# Patient Record
Sex: Female | Born: 1960 | Race: White | Hispanic: No | Marital: Married | Smoking: Never smoker
Health system: Southern US, Community
[De-identification: ages and names within clinical notes are randomized; demographics above are authoritative.]

## PROBLEM LIST (undated history)

## (undated) DIAGNOSIS — K219 Gastro-esophageal reflux disease without esophagitis: Secondary | ICD-10-CM

## (undated) DIAGNOSIS — R112 Nausea with vomiting, unspecified: Secondary | ICD-10-CM

## (undated) DIAGNOSIS — I471 Supraventricular tachycardia, unspecified: Secondary | ICD-10-CM

## (undated) DIAGNOSIS — M549 Dorsalgia, unspecified: Secondary | ICD-10-CM

## (undated) DIAGNOSIS — M1990 Unspecified osteoarthritis, unspecified site: Secondary | ICD-10-CM

## (undated) DIAGNOSIS — F419 Anxiety disorder, unspecified: Secondary | ICD-10-CM

## (undated) DIAGNOSIS — J45909 Unspecified asthma, uncomplicated: Secondary | ICD-10-CM

## (undated) DIAGNOSIS — F329 Major depressive disorder, single episode, unspecified: Secondary | ICD-10-CM

## (undated) DIAGNOSIS — I10 Essential (primary) hypertension: Secondary | ICD-10-CM

## (undated) DIAGNOSIS — F32A Depression, unspecified: Secondary | ICD-10-CM

## (undated) DIAGNOSIS — Z973 Presence of spectacles and contact lenses: Secondary | ICD-10-CM

## (undated) DIAGNOSIS — Z9889 Other specified postprocedural states: Secondary | ICD-10-CM

## (undated) HISTORY — DX: Unspecified asthma, uncomplicated: J45.909

## (undated) HISTORY — DX: Gastro-esophageal reflux disease without esophagitis: K21.9

## (undated) HISTORY — DX: Unspecified osteoarthritis, unspecified site: M19.90

## (undated) HISTORY — DX: Anxiety disorder, unspecified: F41.9

## (undated) HISTORY — PX: PATELLECTOMY: SHX1022

## (undated) HISTORY — DX: Dorsalgia, unspecified: M54.9

## (undated) HISTORY — PX: APPENDECTOMY: SHX54

## (undated) HISTORY — DX: Depression, unspecified: F32.A

## (undated) HISTORY — PX: TOTAL KNEE ARTHROPLASTY: SHX125

## (undated) HISTORY — DX: Supraventricular tachycardia: I47.1

## (undated) HISTORY — DX: Supraventricular tachycardia, unspecified: I47.10

## (undated) HISTORY — DX: Major depressive disorder, single episode, unspecified: F32.9

## (undated) HISTORY — DX: Essential (primary) hypertension: I10

---

## 1998-02-28 ENCOUNTER — Ambulatory Visit (HOSPITAL_COMMUNITY): Admission: RE | Admit: 1998-02-28 | Discharge: 1998-02-28 | Payer: Self-pay

## 1998-08-05 HISTORY — PX: FOOT SURGERY: SHX648

## 1999-08-06 HISTORY — PX: OTHER SURGICAL HISTORY: SHX169

## 2000-01-30 ENCOUNTER — Encounter: Admission: RE | Admit: 2000-01-30 | Discharge: 2000-01-30 | Payer: Self-pay

## 2000-04-15 ENCOUNTER — Other Ambulatory Visit: Admission: RE | Admit: 2000-04-15 | Discharge: 2000-04-15 | Payer: Self-pay | Admitting: *Deleted

## 2000-06-12 ENCOUNTER — Encounter: Payer: Self-pay | Admitting: Rheumatology

## 2000-06-12 ENCOUNTER — Encounter: Admission: RE | Admit: 2000-06-12 | Discharge: 2000-06-12 | Payer: Self-pay | Admitting: Rheumatology

## 2001-04-29 ENCOUNTER — Emergency Department (HOSPITAL_COMMUNITY): Admission: EM | Admit: 2001-04-29 | Discharge: 2001-04-29 | Payer: Self-pay | Admitting: Emergency Medicine

## 2001-04-29 ENCOUNTER — Encounter: Payer: Self-pay | Admitting: Emergency Medicine

## 2001-08-05 HISTORY — PX: HARDWARE REMOVAL: SHX979

## 2001-09-16 ENCOUNTER — Ambulatory Visit (HOSPITAL_BASED_OUTPATIENT_CLINIC_OR_DEPARTMENT_OTHER): Admission: RE | Admit: 2001-09-16 | Discharge: 2001-09-17 | Payer: Self-pay | Admitting: Orthopedic Surgery

## 2001-12-17 ENCOUNTER — Encounter (INDEPENDENT_AMBULATORY_CARE_PROVIDER_SITE_OTHER): Payer: Self-pay | Admitting: Specialist

## 2001-12-17 ENCOUNTER — Ambulatory Visit (HOSPITAL_COMMUNITY): Admission: RE | Admit: 2001-12-17 | Discharge: 2001-12-17 | Payer: Self-pay | Admitting: Gastroenterology

## 2002-01-11 ENCOUNTER — Ambulatory Visit (HOSPITAL_BASED_OUTPATIENT_CLINIC_OR_DEPARTMENT_OTHER): Admission: RE | Admit: 2002-01-11 | Discharge: 2002-01-11 | Payer: Self-pay | Admitting: Orthopedic Surgery

## 2002-09-10 ENCOUNTER — Other Ambulatory Visit: Admission: RE | Admit: 2002-09-10 | Discharge: 2002-09-10 | Payer: Self-pay | Admitting: Obstetrics and Gynecology

## 2002-10-08 ENCOUNTER — Encounter: Payer: Self-pay | Admitting: Emergency Medicine

## 2002-10-08 ENCOUNTER — Emergency Department (HOSPITAL_COMMUNITY): Admission: EM | Admit: 2002-10-08 | Discharge: 2002-10-08 | Payer: Self-pay | Admitting: Emergency Medicine

## 2003-01-27 ENCOUNTER — Encounter: Admission: RE | Admit: 2003-01-27 | Discharge: 2003-01-27 | Payer: Self-pay | Admitting: Rheumatology

## 2003-01-27 ENCOUNTER — Encounter: Payer: Self-pay | Admitting: Rheumatology

## 2003-10-14 ENCOUNTER — Encounter: Admission: RE | Admit: 2003-10-14 | Discharge: 2003-10-14 | Payer: Self-pay | Admitting: Gastroenterology

## 2003-11-14 ENCOUNTER — Ambulatory Visit (HOSPITAL_COMMUNITY): Admission: RE | Admit: 2003-11-14 | Discharge: 2003-11-14 | Payer: Self-pay | Admitting: Gastroenterology

## 2003-12-13 ENCOUNTER — Other Ambulatory Visit: Admission: RE | Admit: 2003-12-13 | Discharge: 2003-12-13 | Payer: Self-pay | Admitting: Obstetrics and Gynecology

## 2004-02-24 ENCOUNTER — Ambulatory Visit (HOSPITAL_COMMUNITY): Admission: RE | Admit: 2004-02-24 | Discharge: 2004-02-24 | Payer: Self-pay | Admitting: Obstetrics and Gynecology

## 2004-05-21 ENCOUNTER — Encounter: Admission: RE | Admit: 2004-05-21 | Discharge: 2004-05-21 | Payer: Self-pay | Admitting: Family Medicine

## 2004-06-22 ENCOUNTER — Ambulatory Visit (HOSPITAL_BASED_OUTPATIENT_CLINIC_OR_DEPARTMENT_OTHER): Admission: RE | Admit: 2004-06-22 | Discharge: 2004-06-22 | Payer: Self-pay | Admitting: Orthopedic Surgery

## 2005-04-09 ENCOUNTER — Other Ambulatory Visit: Admission: RE | Admit: 2005-04-09 | Discharge: 2005-04-09 | Payer: Self-pay | Admitting: Obstetrics and Gynecology

## 2005-07-15 ENCOUNTER — Ambulatory Visit: Payer: Self-pay | Admitting: Family Medicine

## 2005-07-23 ENCOUNTER — Ambulatory Visit: Payer: Self-pay | Admitting: Family Medicine

## 2005-08-06 ENCOUNTER — Ambulatory Visit: Payer: Self-pay | Admitting: Family Medicine

## 2005-08-20 ENCOUNTER — Ambulatory Visit: Payer: Self-pay | Admitting: Family Medicine

## 2005-09-16 ENCOUNTER — Ambulatory Visit: Payer: Self-pay | Admitting: Family Medicine

## 2005-10-15 ENCOUNTER — Ambulatory Visit: Payer: Self-pay | Admitting: Family Medicine

## 2005-10-18 ENCOUNTER — Encounter: Admission: RE | Admit: 2005-10-18 | Discharge: 2005-10-18 | Payer: Self-pay | Admitting: Family Medicine

## 2005-10-29 ENCOUNTER — Ambulatory Visit (HOSPITAL_COMMUNITY): Admission: RE | Admit: 2005-10-29 | Discharge: 2005-10-29 | Payer: Self-pay | Admitting: Gastroenterology

## 2006-02-17 ENCOUNTER — Ambulatory Visit: Payer: Self-pay | Admitting: Family Medicine

## 2006-05-13 DIAGNOSIS — K219 Gastro-esophageal reflux disease without esophagitis: Secondary | ICD-10-CM | POA: Insufficient documentation

## 2006-05-13 DIAGNOSIS — J309 Allergic rhinitis, unspecified: Secondary | ICD-10-CM | POA: Insufficient documentation

## 2006-05-13 DIAGNOSIS — M899 Disorder of bone, unspecified: Secondary | ICD-10-CM | POA: Insufficient documentation

## 2006-05-13 DIAGNOSIS — M949 Disorder of cartilage, unspecified: Secondary | ICD-10-CM

## 2006-05-16 ENCOUNTER — Ambulatory Visit: Payer: Self-pay | Admitting: Family Medicine

## 2006-05-30 ENCOUNTER — Ambulatory Visit: Payer: Self-pay | Admitting: Family Medicine

## 2006-06-01 DIAGNOSIS — E6609 Other obesity due to excess calories: Secondary | ICD-10-CM | POA: Insufficient documentation

## 2006-06-01 DIAGNOSIS — E669 Obesity, unspecified: Secondary | ICD-10-CM

## 2006-06-03 ENCOUNTER — Encounter: Admission: RE | Admit: 2006-06-03 | Discharge: 2006-06-03 | Payer: Self-pay | Admitting: Rheumatology

## 2006-08-07 ENCOUNTER — Encounter: Payer: Self-pay | Admitting: Family Medicine

## 2006-08-07 ENCOUNTER — Other Ambulatory Visit: Admission: RE | Admit: 2006-08-07 | Discharge: 2006-08-07 | Payer: Self-pay | Admitting: Obstetrics & Gynecology

## 2006-08-07 LAB — CONVERTED CEMR LAB
Cholesterol: 262 mg/dL
HCT: 44.5 %
HDL: 70 mg/dL
Hemoglobin: 14.6 g/dL
LDL Cholesterol: 170 mg/dL
MCV: 96.9 fL
Platelets: 296 10*3/uL
TSH: 2.911 microintl units/mL
Triglycerides: 112 mg/dL
WBC, blood: 6 10*3/uL

## 2006-08-12 ENCOUNTER — Ambulatory Visit: Payer: Self-pay | Admitting: Family Medicine

## 2006-08-12 DIAGNOSIS — J45909 Unspecified asthma, uncomplicated: Secondary | ICD-10-CM | POA: Insufficient documentation

## 2006-08-22 ENCOUNTER — Encounter: Payer: Self-pay | Admitting: Family Medicine

## 2006-08-22 DIAGNOSIS — E785 Hyperlipidemia, unspecified: Secondary | ICD-10-CM | POA: Insufficient documentation

## 2006-08-22 DIAGNOSIS — E782 Mixed hyperlipidemia: Secondary | ICD-10-CM | POA: Insufficient documentation

## 2006-09-15 ENCOUNTER — Encounter (INDEPENDENT_AMBULATORY_CARE_PROVIDER_SITE_OTHER): Payer: Self-pay | Admitting: *Deleted

## 2006-09-15 ENCOUNTER — Ambulatory Visit (HOSPITAL_BASED_OUTPATIENT_CLINIC_OR_DEPARTMENT_OTHER): Admission: RE | Admit: 2006-09-15 | Discharge: 2006-09-15 | Payer: Self-pay | Admitting: Obstetrics & Gynecology

## 2006-09-19 ENCOUNTER — Ambulatory Visit: Payer: Self-pay | Admitting: Family Medicine

## 2006-10-14 ENCOUNTER — Encounter: Payer: Self-pay | Admitting: Family Medicine

## 2006-10-28 ENCOUNTER — Encounter: Payer: Self-pay | Admitting: Obstetrics & Gynecology

## 2006-10-28 ENCOUNTER — Inpatient Hospital Stay (HOSPITAL_COMMUNITY): Admission: RE | Admit: 2006-10-28 | Discharge: 2006-10-30 | Payer: Self-pay | Admitting: Obstetrics & Gynecology

## 2007-01-06 ENCOUNTER — Ambulatory Visit: Payer: Self-pay | Admitting: Family Medicine

## 2007-01-06 DIAGNOSIS — M459 Ankylosing spondylitis of unspecified sites in spine: Secondary | ICD-10-CM | POA: Insufficient documentation

## 2007-01-07 ENCOUNTER — Encounter: Payer: Self-pay | Admitting: Family Medicine

## 2007-02-13 ENCOUNTER — Encounter: Payer: Self-pay | Admitting: Family Medicine

## 2007-02-16 ENCOUNTER — Telehealth: Payer: Self-pay | Admitting: Family Medicine

## 2007-04-20 ENCOUNTER — Encounter: Admission: RE | Admit: 2007-04-20 | Discharge: 2007-04-20 | Payer: Self-pay | Admitting: Obstetrics & Gynecology

## 2007-07-14 ENCOUNTER — Ambulatory Visit: Payer: Self-pay | Admitting: Family Medicine

## 2007-07-14 DIAGNOSIS — J069 Acute upper respiratory infection, unspecified: Secondary | ICD-10-CM | POA: Insufficient documentation

## 2007-07-20 ENCOUNTER — Telehealth: Payer: Self-pay | Admitting: Family Medicine

## 2007-07-21 ENCOUNTER — Telehealth: Payer: Self-pay | Admitting: Family Medicine

## 2007-08-03 ENCOUNTER — Ambulatory Visit: Payer: Self-pay | Admitting: Family Medicine

## 2007-08-03 DIAGNOSIS — R002 Palpitations: Secondary | ICD-10-CM | POA: Insufficient documentation

## 2007-08-04 ENCOUNTER — Encounter: Payer: Self-pay | Admitting: Family Medicine

## 2007-08-04 LAB — CONVERTED CEMR LAB
ALT: 21 units/L (ref 0–35)
AST: 18 units/L (ref 0–37)
Albumin: 4.3 g/dL (ref 3.5–5.2)
Alkaline Phosphatase: 47 units/L (ref 39–117)
BUN: 10 mg/dL (ref 6–23)
Basophils Absolute: 0 10*3/uL (ref 0.0–0.1)
Basophils Relative: 0 % (ref 0–1)
CO2: 21 meq/L (ref 19–32)
Calcium: 9.2 mg/dL (ref 8.4–10.5)
Chloride: 106 meq/L (ref 96–112)
Cholesterol: 238 mg/dL — ABNORMAL HIGH (ref 0–200)
Creatinine, Ser: 0.69 mg/dL (ref 0.40–1.20)
Eosinophils Absolute: 0.1 10*3/uL (ref 0.0–0.7)
Eosinophils Relative: 1 % (ref 0–5)
Glucose, Bld: 90 mg/dL (ref 70–99)
HCT: 42.9 % (ref 36.0–46.0)
HDL: 57 mg/dL (ref >39)
Hemoglobin: 14.4 g/dL (ref 12.0–15.0)
LDL Cholesterol: 166 mg/dL — ABNORMAL HIGH (ref 0–99)
Lymphocytes Relative: 43 % (ref 12–46)
Lymphs Abs: 2.8 10*3/uL (ref 0.7–4.0)
MCHC: 33.6 g/dL (ref 30.0–36.0)
MCV: 94.5 fL (ref 78.0–100.0)
Monocytes Absolute: 0.5 10*3/uL (ref 0.1–1.0)
Monocytes Relative: 7 % (ref 3–12)
Neutro Abs: 3.1 10*3/uL (ref 1.7–7.7)
Neutrophils Relative %: 48 % (ref 43–77)
Platelets: 270 10*3/uL (ref 150–400)
Potassium: 4.3 meq/L (ref 3.5–5.3)
RBC: 4.54 M/uL (ref 3.87–5.11)
RDW: 12.7 % (ref 11.5–15.5)
Sodium: 140 meq/L (ref 135–145)
Total Bilirubin: 0.5 mg/dL (ref 0.3–1.2)
Total CHOL/HDL Ratio: 4.2
Total Protein: 7.3 g/dL (ref 6.0–8.3)
Triglycerides: 77 mg/dL (ref <150)
VLDL: 15 mg/dL (ref 0–40)
WBC: 6.5 10*3/uL (ref 4.0–10.5)

## 2007-08-05 ENCOUNTER — Encounter: Payer: Self-pay | Admitting: Family Medicine

## 2007-10-04 HISTORY — PX: ABDOMINAL HYSTERECTOMY: SHX81

## 2007-10-04 HISTORY — PX: TOTAL ABDOMINAL HYSTERECTOMY: SHX209

## 2007-10-15 ENCOUNTER — Encounter: Payer: Self-pay | Admitting: Family Medicine

## 2007-11-10 ENCOUNTER — Encounter: Payer: Self-pay | Admitting: Family Medicine

## 2008-04-14 ENCOUNTER — Encounter: Payer: Self-pay | Admitting: Family Medicine

## 2008-05-16 ENCOUNTER — Encounter: Admission: RE | Admit: 2008-05-16 | Discharge: 2008-05-16 | Payer: Self-pay | Admitting: Obstetrics & Gynecology

## 2008-07-13 ENCOUNTER — Encounter: Payer: Self-pay | Admitting: Family Medicine

## 2008-08-30 ENCOUNTER — Encounter: Payer: Self-pay | Admitting: Family Medicine

## 2009-03-09 ENCOUNTER — Encounter: Payer: Self-pay | Admitting: Family Medicine

## 2009-05-22 ENCOUNTER — Encounter: Admission: RE | Admit: 2009-05-22 | Discharge: 2009-05-22 | Payer: Self-pay | Admitting: Obstetrics & Gynecology

## 2009-07-09 ENCOUNTER — Emergency Department (HOSPITAL_BASED_OUTPATIENT_CLINIC_OR_DEPARTMENT_OTHER): Admission: EM | Admit: 2009-07-09 | Discharge: 2009-07-09 | Payer: Self-pay | Admitting: Emergency Medicine

## 2009-07-09 ENCOUNTER — Ambulatory Visit: Payer: Self-pay | Admitting: Diagnostic Radiology

## 2009-07-20 ENCOUNTER — Encounter: Admission: RE | Admit: 2009-07-20 | Discharge: 2009-07-20 | Payer: Self-pay | Admitting: Rheumatology

## 2009-07-20 ENCOUNTER — Encounter: Payer: Self-pay | Admitting: Family Medicine

## 2009-08-18 ENCOUNTER — Emergency Department (HOSPITAL_COMMUNITY): Admission: EM | Admit: 2009-08-18 | Discharge: 2009-08-18 | Payer: Self-pay | Admitting: Emergency Medicine

## 2009-10-12 ENCOUNTER — Ambulatory Visit (HOSPITAL_COMMUNITY): Admission: RE | Admit: 2009-10-12 | Discharge: 2009-10-12 | Payer: Self-pay | Admitting: General Surgery

## 2010-05-15 ENCOUNTER — Ambulatory Visit: Payer: Self-pay | Admitting: Emergency Medicine

## 2010-05-15 DIAGNOSIS — R059 Cough, unspecified: Secondary | ICD-10-CM | POA: Insufficient documentation

## 2010-05-15 DIAGNOSIS — R05 Cough: Secondary | ICD-10-CM

## 2010-05-15 DIAGNOSIS — R079 Chest pain, unspecified: Secondary | ICD-10-CM | POA: Insufficient documentation

## 2010-05-21 ENCOUNTER — Telehealth (INDEPENDENT_AMBULATORY_CARE_PROVIDER_SITE_OTHER): Payer: Self-pay | Admitting: *Deleted

## 2010-06-03 ENCOUNTER — Encounter: Payer: Self-pay | Admitting: Emergency Medicine

## 2010-06-03 ENCOUNTER — Emergency Department (HOSPITAL_COMMUNITY): Admission: EM | Admit: 2010-06-03 | Discharge: 2010-06-03 | Payer: Self-pay | Admitting: Emergency Medicine

## 2010-06-03 ENCOUNTER — Ambulatory Visit: Payer: Self-pay | Admitting: Diagnostic Radiology

## 2010-08-26 ENCOUNTER — Encounter: Payer: Self-pay | Admitting: Gastroenterology

## 2010-08-26 ENCOUNTER — Encounter: Payer: Self-pay | Admitting: Family Medicine

## 2010-09-04 NOTE — Progress Notes (Signed)
  Phone Note Outgoing Call   Call placed by: Lajean Saver RN,  May 21, 2010 2:26 PM Call placed to: Patient Summary of Call: Call back: No answer. Message left with reason for call and call back with any questions.

## 2010-09-04 NOTE — Assessment & Plan Note (Signed)
Summary: SIDE AND BACK PAIN/COUGH   Vital Signs:  Patient Profile:   50 Years Old Female CC:      Upper back pain started in right scapula now radiates to front under breast, cough x 1 month Height:     64 inches Weight:      195 pounds O2 Sat:      98 % O2 treatment:    Room Air Temp:     99.0 degrees F oral Pulse rate:   79 / minute Pulse rhythm:   regular Resp:     16 per minute BP sitting:   117 / 75  (left arm) Cuff size:   large  Vitals Entered By: Emilio Math (May 15, 2010 10:47 AM)                  Current Allergies (reviewed today): CODEINE SULFATE (CODEINE SULFATE)History of Present Illness Chief Complaint: Upper back pain started in right scapula now radiates to front under breast, cough x 1 month History of Present Illness: Upper back pain start R lower scapula and radiates down around chest for the past few weeks.  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  No rash or tenderness.  Worse with coughing, sneezing, twisting.  Has had some URI symptoms including cough recently, but also has a history of allergic rhinitis and is on Allegra-D daily.  Flexeril helps.  Current Meds MULTIVITAMINS  CAPS (MULTIPLE VITAMIN) 1tab by mouth daily DEXILANT 30 MG CPDR (DEXLANSOPRAZOLE)  FLEXERIL 10 MG TABS (CYCLOBENZAPRINE HCL)  ALLEGRA-D ALLERGY & CONGESTION 60-120 MG XR12H-TAB (FEXOFENADINE-PSEUDOEPHEDRINE)  EFFEXOR XR 37.5 MG XR24H-CAP (VENLAFAXINE HCL)  TESSALON 200 MG CAPS (BENZONATATE) 1 cap by mouth three times a day as needed for cough MELOXICAM 7.5 MG TABS (MELOXICAM) 1 tab by mouth two times a day for 10 days, then as needed  REVIEW OF SYSTEMS Constitutional Symptoms      Denies fever, chills, night sweats, weight loss, weight gain, and fatigue.  Eyes       Denies change in vision, eye pain, eye discharge, glasses, contact lenses, and eye surgery. Ear/Nose/Throat/Mouth       Denies hearing loss/aids, change in  hearing, ear pain, ear discharge, dizziness, frequent runny nose, frequent nose bleeds, sinus problems, sore throat, hoarseness, and tooth pain or bleeding.  Respiratory       Complains of dry cough and shortness of breath.      Denies productive cough, wheezing, asthma, bronchitis, and emphysema/COPD.  Cardiovascular       Complains of chest pain and tires easily with exhertion.      Denies murmurs.    Gastrointestinal       Denies stomach pain, nausea/vomiting, diarrhea, constipation, blood in bowel movements, and indigestion. Genitourniary       Denies painful urination, kidney stones, and loss of urinary control. Neurological       Denies paralysis, seizures, and fainting/blackouts. Musculoskeletal       Complains of muscle pain and decreased range of motion.      Denies joint pain, joint stiffness, redness, swelling, muscle weakness, and gout.  Skin       Denies bruising, unusual mles/lumps or sores, and hair/skin or nail changes.  Psych       Denies mood changes, temper/anger issues, anxiety/stress, speech problems, depression, and sleep problems.  Past History:  Past Medical History: Reviewed history from 01/06/2007 and no changes required. 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 Appendectomy , arm surgery,  foot surgery TAH 2008  Family History: Reviewed history from 06/01/2006 and no changes required. father healthy  mother died of AMI (94), had HTN, DM sister diabetic  Social History: Reviewed history from 05/13/2006 and no changes required. 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  Lesbian relationship.   Nonsmoker. Trouble exercising in winter due to bronchospasm. Physical  Exam General appearance: well developed, well nourished, no acute distress Ears: normal, no lesions or deformities Nasal: mucosa pink, nonedematous, no septal deviation, turbinates normal Oral/Pharynx: tongue normal, posterior pharynx without erythema or exudate Chest/Lungs: no rales, wheezes, or rhonchi bilateral, breath sounds equal without effort. TTP along R 9th/10th ribs Heart: regular rate and  rhythm, no murmur Extremities: R shoulder FROM Neurological: grossly intact and non-focal Skin: no obvious rashes or lesions.  No evidence of shingles. MSE: oriented to time, place, and person Assessment New Problems: COUGH (ICD-786.2) CHEST PAIN, RIGHT (ICD-786.50)  CXR is normal.  Will assume costochondritis vs muscle strain and will treat accordingly.    Plan New Medications/Changes: MELOXICAM 7.5 MG TABS (MELOXICAM) 1 tab by mouth two times a day for 10 days, then as needed  #50 x 0, 05/15/2010, Hoyt Koch MD TESSALON 200 MG CAPS (BENZONATATE) 1 cap by mouth three times a day as needed for cough  #30 x 0, 05/15/2010, Hoyt Koch MD  New Orders: New Patient Level III [id number] T-Chest x-ray, 2 views [71020] Planning Comments:   Take meds as directed Follow-up with your primary care physician if not improving or if getting worse   The patient and/or caregiver has been counseled thoroughly with regard to medications prescribed including dosage, schedule, interactions, rationale for use, and possible side effects and they verbalize understanding.  Diagnoses and expected course of recovery discussed and will return if not improved as expected or if the condition worsens. Patient and/or caregiver verbalized understanding.  Prescriptions: MELOXICAM 7.5 MG TABS (MELOXICAM) 1 tab by mouth two times a day for 10 days, then as needed  #50 x 0   Entered and Authorized by:   Hoyt Koch MD   Signed by:   Hoyt Koch MD on 05/15/2010   Method used:   Print then Give to  Patient   RxID:   [id number] TESSALON 200 MG CAPS (BENZONATATE) 1 cap by mouth three times a day as needed for cough  #30 x 0   Entered and Authorized by:   Hoyt Koch MD   Signed by:   Hoyt Koch MD on 05/15/2010   Method used:   Print then Give to Patient   RxID:   [id number]   Orders Added: 1)  New Patient Level III [29528] 2)  T-Chest x-ray, 2 views [71020]

## 2010-09-04 NOTE — Letter (Signed)
Summary: CONTROLLED MED RX POLICY  CONTROLLED MED RX POLICY   Imported By: Dannette [name] 05/15/2010 16:01:40  _____________________________________________________________________  External Attachment:    Type:   Image     Comment:   External Document

## 2010-10-17 LAB — BASIC METABOLIC PANEL
BUN: 12 mg/dL (ref 6–23)
CO2: 25 mEq/L (ref 19–32)
Calcium: 9.5 mg/dL (ref 8.4–10.5)
Chloride: 107 mEq/L (ref 96–112)
Creatinine, Ser: 0.6 mg/dL (ref 0.4–1.2)
GFR calc Af Amer: >60 mL/min (ref >60)
GFR calc non Af Amer: >60 mL/min (ref >60)
Glucose, Bld: 85 mg/dL (ref 70–99)
Potassium: 4.3 mEq/L (ref 3.5–5.1)
Sodium: 143 mEq/L (ref 135–145)

## 2010-10-17 LAB — CBC
HCT: 43.2 % (ref 36.0–46.0)
Hemoglobin: 14.7 g/dL (ref 12.0–15.0)
MCH: 32.2 pg (ref 26.0–34.0)
MCHC: 34.1 g/dL (ref 30.0–36.0)
MCV: 94.5 fL (ref 78.0–100.0)
Platelets: 260 10*3/uL (ref 150–400)
RBC: 4.57 MIL/uL (ref 3.87–5.11)
RDW: 12.4 % (ref 11.5–15.5)
WBC: 7 10*3/uL (ref 4.0–10.5)

## 2010-10-26 ENCOUNTER — Other Ambulatory Visit: Payer: Self-pay | Admitting: Obstetrics & Gynecology

## 2010-10-26 DIAGNOSIS — Z1231 Encounter for screening mammogram for malignant neoplasm of breast: Secondary | ICD-10-CM

## 2010-10-26 DIAGNOSIS — Z90710 Acquired absence of both cervix and uterus: Secondary | ICD-10-CM

## 2010-10-30 ENCOUNTER — Ambulatory Visit
Admission: RE | Admit: 2010-10-30 | Discharge: 2010-10-30 | Disposition: A | Discharge status: Home / Self Care | Payer: Self-pay | Source: Ambulatory Visit | Attending: Obstetrics & Gynecology | Admitting: Obstetrics & Gynecology

## 2010-10-30 DIAGNOSIS — Z1231 Encounter for screening mammogram for malignant neoplasm of breast: Secondary | ICD-10-CM

## 2010-10-30 DIAGNOSIS — Z90710 Acquired absence of both cervix and uterus: Secondary | ICD-10-CM

## 2010-11-06 LAB — CBC
HCT: 41.9 % (ref 36.0–46.0)
Hemoglobin: 14.6 g/dL (ref 12.0–15.0)
MCHC: 34.8 g/dL (ref 30.0–36.0)
MCV: 95.8 fL (ref 78.0–100.0)
Platelets: 321 10*3/uL (ref 150–400)
RBC: 4.37 MIL/uL (ref 3.87–5.11)
RDW: 12.3 % (ref 11.5–15.5)
WBC: 11.9 10*3/uL — ABNORMAL HIGH (ref 4.0–10.5)

## 2010-11-06 LAB — DIFFERENTIAL
Basophils Absolute: 0.3 10*3/uL — ABNORMAL HIGH (ref 0.0–0.1)
Basophils Relative: 2 % — ABNORMAL HIGH (ref 0–1)
Eosinophils Absolute: 0.1 10*3/uL (ref 0.0–0.7)
Eosinophils Relative: 1 % (ref 0–5)
Lymphocytes Relative: 30 % (ref 12–46)
Lymphs Abs: 3.6 10*3/uL (ref 0.7–4.0)
Monocytes Absolute: 1 10*3/uL (ref 0.1–1.0)
Monocytes Relative: 8 % (ref 3–12)
Neutro Abs: 6.9 10*3/uL (ref 1.7–7.7)
Neutrophils Relative %: 58 % (ref 43–77)

## 2010-11-06 LAB — BASIC METABOLIC PANEL
BUN: 15 mg/dL (ref 6–23)
CO2: 24 mEq/L (ref 19–32)
Calcium: 9.3 mg/dL (ref 8.4–10.5)
Chloride: 104 mEq/L (ref 96–112)
Creatinine, Ser: 0.8 mg/dL (ref 0.4–1.2)
GFR calc Af Amer: >60 mL/min (ref >60)
GFR calc non Af Amer: >60 mL/min (ref >60)
Glucose, Bld: 91 mg/dL (ref 70–99)
Potassium: 3.8 mEq/L (ref 3.5–5.1)
Sodium: 143 mEq/L (ref 135–145)

## 2010-12-21 NOTE — Op Note (Signed)
[name], Tracy Landry              ACCOUNT NO.:  [id number]   MEDICAL RECORD NO.:  [id number]          PATIENT TYPE:  AMB   LOCATION:  DSC                          FACILITY:  [facility]   PHYSICIAN:  Cindee Salt, M.D.       DATE OF BIRTH:  [birth year]   DATE OF PROCEDURE:  06/22/2004  DATE OF DISCHARGE:                                 OPERATIVE REPORT   PREOPERATIVE DIAGNOSIS:  Status post Essex-Lopresti reconstruction, right  forearm.   POSTOPERATIVE DIAGNOSIS:  Status post Essex-Lopresti reconstruction, right  forearm.   OPERATION:  Removal of plate and screws from ulnar shortening osteotomy,  right ulna.   SURGEONS:  Cindee Salt, M.D. and Dr. Carolyne Fiscal.   ANESTHESIA:  General.   HISTORY:  The patient is a 50 year old female with a history of an Essex-  Lopresti injury of her right forearm.  She has undergone reconstruction with  shortening of her ulna.  She is admitted now for removal of plate and screws  which are causing some irrigation.   DESCRIPTION OF PROCEDURE:  The patient is brought to the operating room  where general anesthetic was carried out without difficulty.  She was  prepped using DuraPrep in the supine position with the right arm free.  The  limb was exsanguinated with an Esmarch bandage and tourniquet placed high on  the arm.  It was inflated to 250 mmHg.  The old incision was used, carried  down through the subcutaneous tissue.  Bleeders were electrocauterized.  Dissection was carried down to the ulna between the fifth and sixth dorsal  compartment musculature.  Periosteum was elevated from the plate.  The plate  and screws were then removed without difficulty.  The bone was fully healed.  The transferred tendon was fully healed into the bone.  The screw areas were  minimally debrided.  The wound was irrigated.  The muscle was then closed  with a running 4-0 Vicryl suture.  The subcutaneous tissue was closed with  interrupted 4-0 Vicryl and the skin with a  subcuticular 4-0 Monocryl suture.  Steri-Strips were applied.  Sterile compressive dressing and splint was  applied.  The patient tolerated the procedure well and was taken to the  recovery room for observation in satisfactory condition.  She is discharged  home to return to the [facility] [place] in one week on Talwin NX and  Phenergan.       GK/MEDQ  D:  06/22/2004  T:  06/23/2004  Job:  [id number]

## 2010-12-21 NOTE — Op Note (Signed)
[place]  Patient:    Tracy Landry, Tracy Landry Visit Number: [id number] MRN: [id number]          Service Type: DSU Location: [facility] Attending Physician:  Ronne Binning Dictated by:   Nicki Reaper, M.D. Proc. Date: 09/16/01 Admit Date:  09/16/2001   CC:         Molly Maduro A. Thurston Hole, M.D.   Operative Report  PREOPERATIVE DIAGNOSIS:  ______________ radial head fracture with collapse, right arm.  POSTOPERATIVE DIAGNOSIS:  ______________ radial head fracture with collapse, right arm.  OPERATION/PROCEDURE:  Ulnar shortening osteotomy, transfer pronator tares, repair TFCC right arm.  SURGEON:  Nicki Reaper, M.D.  ASSISTANT:  Artist Pais. Mina Marble, M.D.  ANESTHESIA:  General anesthesia.  ANESTHESIOLOGIST:  Janetta Hora. Gelene Mink, M.D.  INDICATION FOR PROCEDURE:  The patient is a 50 year old female who suffered a fall.  She suffered a fracture of her radial head.  This has collapsed.  She now has a long ulna and wrist pain.  DESCRIPTION OF PROCEDURE:  The patient is brought to the operating room where a general anesthetic was carried out without difficulty.  She was prepped and draped using Betadine scrubbing solution with the right arm free. The limb was exanguinated with an Esmarch bandage. Tourniquet placed high on the arm, was inflated to 250 mmHg.  A straight incision was made on the radial aspect of the mid forearm, carried down through subcutaneous tissue.  Bleeders were electrocauterized. The interval between the brachial radialis and wrist extensors was opened.  The radial nerve identified and protected.  The pronator tares was then identified approximately 10 cm of the most distal aspect two thirds of the pronator tares was harvested, relieving the proximal third intact. This was left attached to the distal radius. A separate incision was then made on the ulnar forearm, carried down through subcutaneous tissue. Bleeders were  electrocauterized and dorsal sensory branch of the ulnar nerve identified and protected. The dissection carried down between the extensor digiti quinti, extensor carpi ulnaris.  The ulna was identified.  The six-hole DCP plate was then prebent to match the ulna shaft.  This was then placed distally and drilled. These drills were tapped, measured, found to be 14 mm. The two distal screws were placed without compression. The plate was then removed. The rotation marked.  An osteotomy was then performed after placement of retractors and oblique in nature.  Approximately 3 to 4 mm of ulnar shaft was removed. The plate was reapproximated onto the distal holes. The pronator tares was then transferred along the interosseus membrane dorsally over to the osteotomy site. This was then placed under the plate just distal to the osteotomy site and the screw were tightened. The osteotomy was realigned and the proximal drill holes placed under compression.  These again measured 14 to 16 mm. The remaining four screws were placed. The oblique osteotomy was made so as to compress the proximal fragment beneath the plate wedging it between the plate and the distal portion of the ulna.  The most proximal distal set of screws was placed under compression also which firmly compressed the osteotomy together.  Prior to doing this, the pronator tares was firmly pulled so as to maintain length and alignment.  X-rays are revealed. The osteotomy site in good position. The dissection was carried distally.  The interval between the extensor carpi ulnaris, the extensor digiti quinti was opened.  The TFCC was isolated. This was found to be avulsed from the ulnar  attachment.  A position for a Staytek anchor was then positioned under image intensification.  A 2.5  anchor was then placed.  It was pulled out and a 3.5 was used to replace this into the fovea of the distal ulna.  The TFCC was then reattached to the avulsed portion  of the TFCC from the digital ulna and sutured into position with the [facility] anchor. The knot was brought out just dorsal to the extensor carpi ulnaris. This firmly fixed the TFCC back to the fovia. The wounds were copiously irrigated. The capsule closed with figure-of-eight 2-0 Vicryl. The fascia closed with 2-0 Vicryl on both radial and ulnar sides after irrigation.  The skin was closed after closure of the subcutaneous tissue with a Vicryl with a subcuticular 4-0 Monocryl suture.  Steri-Strips were applied.  A sterile compressive dressing and long arm splint applied with the wrist in approximately five degrees of supination. The patient tolerated the procedure well.  On deflation of the tourniquet, all fingers immediately pinked and she was taken to the recovery room for observation in satisfactory condition. She is admitted for overnight stay for pain control and antibiotics.  DISCHARGE MEDICATIONS:  She will be discharged on Percocet and Keflex.  FOLLOW-UP: She will return to the office in approximately one week. Dictated by:   Nicki Reaper, M.D. Attending Physician:  Ronne Binning DD:  09/16/01 TD:  09/16/01 Job: [id number]

## 2010-12-21 NOTE — Op Note (Signed)
NAME:  Tracy Landry, Tracy Landry                        ACCOUNT NO.:  [id number]   MEDICAL RECORD NO.:  [id number]                   PATIENT TYPE:  AMB   LOCATION:  ENDO                                 FACILITY:  [facility]   PHYSICIAN:  Anselmo Rod, M.D.               DATE OF BIRTH:  Jan 16, 1961   DATE OF PROCEDURE:  11/14/2003  DATE OF DISCHARGE:                                 OPERATIVE REPORT   PROCEDURE PERFORMED:  Esophagogastroduodenoscopy.   ENDOSCOPIST:  Charna Elizabeth, M.D.   INSTRUMENT USED:  Olympus video panendoscope.   INDICATIONS FOR PROCEDURE:  The patient is a 50 year old white female with  epigastric pain, right upper quadrant discomfort with normal abdominal  ultrasound and HIDA scan rule out peptic ulcer disease, esophagitis,  gastritis, etc.   PREPROCEDURE PREPARATION:  Informed consent was procured from the patient.  The patient was fasted for eight hours prior to the procedure.   PREPROCEDURE PHYSICAL:  The patient had stable vital signs.  Neck supple,  chest clear to auscultation.  S1, S2 regular.  Abdomen soft with normal  bowel sounds.   DESCRIPTION OF PROCEDURE:  The patient was placed in the left lateral  decubitus position and sedated with 100 mg of Demerol and 10 mg of Versed in  slow incremental doses.  Once the patient was adequately sedated and  maintained on low-flow oxygen and continuous cardiac monitoring, the Olympus  video panendoscope was advanced through the mouth piece over the tongue into  the esophagus under direct vision.  The entire esophagus appeared normal  with no evidence of ring, stricture, masses, esophagitis or Barrett's  mucosa.  The scope was then advanced to the stomach.  The entire gastric  mucosa and proximal small bowel appeared normal.   IMPRESSION:  Normal esophagogastroduodenoscopy.   RECOMMENDATIONS:  1. Continue proton pump inhibitors.  2. Avoid nonsteroidals.  3. Outpatient followup for further recommendations.                                         Anselmo Rod, M.D.   JNM/MEDQ  D:  11/14/2003  T:  11/15/2003  Job:  [id number]   cc:   Pollyann Savoy, M.D.  [address]  [place], [place] [address]  Fax: [phone]

## 2010-12-21 NOTE — Op Note (Signed)
[place]  Patient:    Tracy Landry, Tracy Landry Visit Number: [id number] MRN: [id number]          Service Type: DSU Location: [facility] Attending Physician:  Twana First Dictated by:   Elana Alm Thurston Hole, M.D. Proc. Date: 01/11/02 Admit Date:  01/11/2002                             Operative Report  PREOPERATIVE DIAGNOSIS:  Right shoulder adhesive capsulitis.  POSTOPERATIVE DIAGNOSIS:  Right shoulder adhesive capsulitis.  PROCEDURE:  Right shoulder examination under anesthesia followed by manipulation and injection.  SURGEON:  Dr. Salvatore Marvel.  ASSISTANT:  Gifford Shave, P.A.  ANESTHESIA:  General.  OPERATIVE TIME:  Five minutes.  COMPLICATIONS:  None.  INDICATION FOR PROCEDURE:  [name] is a 50 year old woman who has had significant right shoulder pain and stiffness for the past three to four months increasing in nature.  This has not responded to conservative care. MRI and examination have confirmed right shoulder adhesive capsulitis with rotator cuff tendinitis but no evidence of a rotator cuff tear.  She has failed conservative care and is now to undergo right shoulder manipulation under anesthesia and injection.  DESCRIPTION OF PROCEDURE:  [name] was brought to the operating room on January 11, 2002, and placed on the operative table in the supine position after a supraclavicular block had been placed in the holding room.  After being placed under general anesthesia, initial range of motion was tested.  Forward flexion was to 150, abduction of 120, and internal-external rotation of 50 degrees.  A gentle manipulation was carried out breaking up soft adhesions and improving forward flexion to 175, abduction to 175, and internal-external rotation of 90 degrees.  The shoulder remained stable to ligamentous exam.  After this was done, then she had an injection done sterilely of 1 cc of Depo-Medrol and 10 cc of 0.25% Marcaine  with epinephrine with half of this placed in the subacromial space and half in the joint through a posterior approach.  After this was done, she was awakened and taken to the recovery room in stable condition.  FOLLOWUP CARE:  [name] will be followed as an outpatient on Vicodin and Bextra.  Early aggressive physical therapy.  She be back in the office in a week for recheck and followup. Dictated by:   Elana Alm Thurston Hole, M.D. Attending Physician:  Twana First DD:  01/11/02 TD:  01/12/02 Job: [id number]

## 2010-12-21 NOTE — Discharge Summary (Signed)
[name], [name]              ACCOUNT NO.:  [id number]   MEDICAL RECORD NO.:  [id number]          PATIENT TYPE:  INP   LOCATION:  9310                          FACILITY:  [facility]   PHYSICIAN:  M. Leda Quail, MD  DATE OF BIRTH:  1961-01-07   DATE OF ADMISSION:  10/28/2006  DATE OF DISCHARGE:  10/30/2006                               DISCHARGE SUMMARY   ADMISSION DIAGNOSES:  71. A 50 year old G0 single white female with history of menorrhagia.  2. Dysmenorrhea.  3. History of endometrial polyps.  4. Persistent right ovarian cyst.  5. History of hyperlipidemia.  6. History of ankylosing spondylitis.   POSTOPERATIVE DIAGNOSES:  5. A 49 year old G0 single white female with history of menorrhagia.  2. Dysmenorrhea.  3. History of endometrial polyps.  4. Persistent right ovarian cyst.  5. History of hyperlipidemia.  6. History of ankylosing spondylitis.   PROCEDURES:  Total abdominal hysterectomy, right salpingo-oophorectomy.   HISTORY AND PHYSICAL:  Written H&P is in the chart.  In brief, Ms.  Tracy Landry is a 50 year old white female who has a history of menorrhagia  and dysmenorrhea that has developed over the last 6 months.  She was  evaluated with sonohysterogram that showed endometrial polyps.  The  patient underwent hysteroscopy with polyp resection and D&C.  The  bleeding continued.  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  Therefore, we discussed other options, and she  has opted to proceed with TAH/RSO.  The patient does have a history of  hyperlipidemia and ankylosing spondylitis, and she has seen a  rheumatologist.  She is on Enbrel but has not had a shot in about three  weeks.  The patient is not planning to have any more injections for  another two to three weeks as long as she does well.   HOSPITAL COURSE:  The patient was admitted through same day surgery,  was  taken to the OR where a TAH/RSO was performed without difficulty.  Uterus, cervix and right tube and ovary were sent to the pathologist.  She made good urine during the procedure and had 200 mL of blood loss.  The surgery went without event.  After her surgery was complete, she was  taken to the recovery room and from there to the third floor where she  was for the remainder of her hospitalization.  Her hospitalization was  uneventful.  The evening of postoperative day 0, she did have some  nausea.  Her pain was under good control.  She had stable vital signs.  She had made good urine.  She did have On-Q pump for pain control and  low-dose Dilaudid PCA.  She did have some leaking from the right On-Q  pump tubing, but the incision was clean, dry and intact.  The morning of  postoperative day 1, the patient was doing very well.  She had excellent  pain control.  She had tolerated liquids well.  She was ready to have  regular diet.  Her nausea  had resolved.  Vital signs were stable.  She  was afebrile.  She had made 2925 of urine output in the previous 24  hours.  She did have postoperative blood work which was good.  Her  sodium was 36, potassium 3.7; BUN and creatinine were4 and 0.6,  respectively.  She had a white count 12.4, hemoglobin of 11.1, and a  platelet count of 237.  Her Foley catheter was removed, and she was able  to void without difficulty.  She tolerated a regular diet, and PCA was  removed.  She was able to tolerate Percocet very well for pain control.  She walked multiple times and had an uneventful evening.  The morning of  postoperative day #2, she was without complaints.  She had passed  flatus, and she had good pain control again.  She had ambulated.  She  had voided without difficulty.  She was taking all medications by mouth  and was tolerating a regular diet.  Her vital signs were stable.  She  was afebrile.  Her exam was benign.  She had great bowel sounds, and  her  incision was clean, dry and intact.  The On-Q pump tubing was removed  without difficulty.  At this point, the patient had met all criteria for  discharge and will be discharged to home in stable condition.   DISCHARGE INSTRUCTIONS:  These were provided in the written and verbal  form.  Her postoperative medications will include Motrin 800 mg every 8  hours as needed for pain and Percocet 5/325 one to two tablets every 4  to 6 hours as needed for more severe pain.  For sleep, she was given a  prescription for Restoril 15 mg 1 to 2 tablets p.o. every night p.r.n.  sleep.  She will go home and continue on her regular medications which  are Nexium 40-mg tablet every morning, Mucinex D every day, Zetia 10-mg  tablet every day, Enbrel injections as prescribed by her rheumatologist  and albuterol inhaler 1 to 2 puffs every 6 hours as needed for shortness  of breath.  For constipation, she is going to start Colace 100 mg twice  a day and use milk of magnesium 2 tablespoons and repeat in 4 hours if  she has severe constipation.  The patient knows to use soap and water on  her incision only, and she is to do no driving and no lifting.  She is  also have nothing in the vagina for 6 weeks and may shower.  She is  going to see me next Thursday at 10:45 in the morning.  She already has  an appointment for this.  Specifically, she is also to call if she has  any fevers, redness or drainage from her incision, worsening bleeding  like a menstrual cycle or worsening pain.  She will be discharged to  home with friend and is in stable condition.      Lum Keas, MD  Electronically Signed     MSM/MEDQ  D:  10/30/2006  T:  10/30/2006  Job:  [id number]

## 2010-12-21 NOTE — Op Note (Signed)
Tracy Landry, Tracy Landry              ACCOUNT NO.:  [id number]   MEDICAL RECORD NO.:  [id number]          PATIENT TYPE:  AMB   LOCATION:  [facility]                         [facility]ACILITY:  [facility]   PHYSICIAN:  M. Leda Quail, MD  DATE OF BIRTH:  [birth year]   DATE OF PROCEDURE:  09/15/2006  DATE OF DISCHARGE:                               OPERATIVE REPORT   PREOPERATIVE DIAGNOSES:  40. A 50 year old, G0, white female with menorrhagia of recent onset.  2. Sonohysterogram showing endometrial polyp.  3. Ankylosis spondylitis.  4. Obesity.  5. Borderline osteopenia.   POSTOPERATIVE DIAGNOSES:  19. A 50 year old, G0, white female with menorrhagia of recent onset.  2. Sonohysterogram showing endometrial polyp.  3. Ankylosis spondylitis.  4. Obesity.  5. Borderline osteopenia.   PROCEDURE:  Hysteroscopy and D&C which resulted in removal of the polyp.   SURGEON:  M. Leda Quail, MD.   ASSISTANT:  OR staff.   ANESTHESIA:  MAC.   SPECIMENS:  Endometrial curettings sent to pathology.   ESTIMATED BLOOD LOSS:  Minimal.   FLUIDS:  500 mL of LR.   URINE OUTPUT:  50 mL.   HYSTEROSCOPIC MEDIA DEFICIT:  40 mL of 3% sorbitol.   INDICATIONS FOR PROCEDURE:  Tracy Landry is a 50 year old, G0, white  female who has a recent onset of menorrhagia. Her cycles are quite heavy  also with pain. Her cycles are about every 14-18 days with 2 days of  flowing enough that she needs to change pads in about an hour. She did  undergo evaluation with sonohysterogram but the patient had some  difficulty with discomfort. She did have what appeared to be a 1.5 x 0.2  to 0.3 cm sessile lesion anteriorly. This was consistent with either a  polyp or a clot. The patient was unable to tolerate finishing the  procedure with an endometrial biopsy. Because of this, I discussed going  ahead and proceeding with hysteroscopy with a D&C and polyp removal 1)  for possible treatment of the polyp, 2) for possible treatment of the  bleeding, and 3) for pathology diagnosis. If her bleeding does resume, I  have discussed with her proceeding with endometrial ablation.   DESCRIPTION OF PROCEDURE:  The patient was taken to the operating room,  she was placed in supine position, general anesthesia with MAC was  administered by the anesthesia staff without difficulty. Legs were  positioned in the Chester stirrups in the low lithotomy position. The  perineum and inner thighs and vagina were prepped in normal sterile  fashion. A Foley catheter was used to drain the bladder of all urine.  The patient was then prepped in normal sterile. The legs were positioned  in the high lithotomy position. A bivalve speculum was placed in the  vagina, the anterior lip of the cervix was visualized and grasped with a  single tooth tenaculum. The uterus then sounded to approximately 7 cm.  The cervix was then dilated with Shawnie Pons dilators up to #21. The operative  hysteroscope was then used to visualize the endometrial cavity. There  was a polyp on the anterior aspect of the  endometrial cavity. The  decision was made to go ahead and curette the endometrium and see if we  could remove the polyp this way. Using a #1 rough curette, the  endometrial cavity was curetted until a rough gritty texture was noted  in all quadrants. There was some endometrial tissue that was obtained  but no distinct polyp tissue. The endometrial cavity was then  revisualized with the hysteroscope and no significant abnormality was  noted. The polyp or tissue that was previously present was gone. Photo  documentation was made. At this point, the cavity was curetted one  additional time with only a small amount of tissue obtained. The  procedure ended at this time. The tenaculum was removed from the cervix  and there was some bleeding from the tenaculum site. This was made  hemostatic using silver nitrate. All instruments were removed from the  vagina.   Sponge, lap and  instrument counts were correct x2. There were no needles  were used on the field.      Tracy Keas, MD  Electronically Signed     MSM/MEDQ  D:  09/15/2006  T:  09/15/2006  Job:  [id number]

## 2010-12-21 NOTE — Procedures (Signed)
[place]  Patient:    ABELLA, SHUGART. Visit Number: [id number] MRN: [id number]          Service Type: Attending:  Anselmo Rod, M.D. Dictated by:   Anselmo Rod, M.D. Proc. Date: 12/18/01   CC:         Pollyann Savoy, M.D.   Procedure Report  DATE OF BIRTH:  Jan 28, 1961  REFERRING PHYSICIAN:  Pollyann Savoy, M.D.  PROCEDURE PERFORMED:  Colonoscopy with biopsies.  ENDOSCOPIST:  Anselmo Rod, M.D.  INSTRUMENT USED:  Olympus adjustable pediatric video colonoscope.  INDICATIONS FOR PROCEDURE:  Rectal bleeding with mucoid stools in a 50 year old white female.  Rule out colonic polyps, masses, inflammatory bowel disease, etc.  PREPROCEDURE PREPARATION:  Informed consent was procured from the patient. The patient was fasted for eight hours prior to the procedure and prepped with a bottle of magnesium citrate and a gallon of NuLytely the night prior to the procedure.  PREPROCEDURE PHYSICAL:  The patient had stable vital signs.  Neck supple. Chest clear to auscultation.  S1, S2 regular.  Abdomen soft with normal bowel sounds.  DESCRIPTION OF PROCEDURE:  The patient was placed in the left lateral decubitus position and sedated with 100 mg of Demerol and 10 mg of Versed intravenously.  Once the patient was adequately sedated and maintained on low-flow oxygen and continuous cardiac monitoring, the Olympus video colonoscope was advanced from the rectum to the cecum and terminal ileum without difficulty.  Two small patchy ulcers were seen in the terminal ileum. These were biopsied for pathology to rule out Crohns disease.  Moderate sized but nonbleeding internal hemorrhoids were seen on retroflexion in the rectum. The rest of the colonic mucosa appeared healthy up to the cecum.  The appendiceal orifice and ileocecal valve were clearly visualized and photographed.  IMPRESSION: 1. Small ulceration see in the terminal ileum,  biopsied for pathology. 2. Healthy-appearing colonic mucosa. 3. Moderate-sized nonbleeding internal hemorrhoids.  RECOMMENDATIONS: 1. Await pathology results. 2. Avoid all nonsteroidals including aspirin. 3. Outpatient follow-up within the next seven to 10 days.Dictated by:   Anselmo Rod, M.D. Attending:  Anselmo Rod, M.D. DD:  12/18/01 TD:  12/21/01 Job: [id number]

## 2010-12-21 NOTE — Op Note (Signed)
NAMENORELLE, RUNNION              ACCOUNT NO.:  [id number]   MEDICAL RECORD NO.:  [id number]          PATIENT TYPE:  AMB   LOCATION:  [facility]                           FACILITY:  [facility]   PHYSICIAN:  M. Leda Quail, MD  DATE OF BIRTH:  03-18-1961   DATE OF PROCEDURE:  10/28/2006  DATE OF DISCHARGE:                               OPERATIVE REPORT   PREOPERATIVE DIAGNOSES:  26. A 50 year old G0 single white female with history of menorrhagia.  2. Chronic right-sided pelvic pain.  3. History of right ovarian cyst which is persistent on ultrasound.  4. History of endometrial polyps.  5. Hypercholesterolemia.  6. History of ankylosing spondylitis.  7. Gastroesophageal reflux disease.  8. Dysmenorrhea.   POSTOPERATIVE DIAGNOSES:  11. A 50 year old G0 single white female with history of menorrhagia.  2. Chronic right-sided pelvic pain.  3. History of right ovarian cyst which is persistent on ultrasound.  4. History of endometrial polyps.  5. Hypercholesterolemia.  6. History of ankylosing spondylitis.  7. Gastroesophageal reflux disease.  8. Dysmenorrhea.   PROCEDURE:  TAH/RSO   SURGEON:  Dr. Hyacinth Meeker.   ASSISTANT:  Dr. Meredeth Ide.   ANESTHESIA:  General endotracheal.   SPECIMENS:  Uterus, cervix, right tube and ovary off to pathology.   ESTIMATED BLOOD LOSS:  200.   FLUID OUTPUT:  225 mL of clear urine in the Foley catheter.   FLUIDS:  2100 mL of LR.   COMPLICATIONS:  None.   INDICATIONS:  Ms. Burgard is a very pleasant 50 year old G0 white female  who has a history of menorrhagia and chronic pelvic pain as well as  dysmenorrhea.  She has undergone a workup which included sonohysterogram  that shows endometrial polyps.  She underwent a hysteroscopic polyp  resection which did not improve her bleeding.  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  She has undergone counseling about risks and benefits and  has decided to go ahead and proceed with her surgery.   PROCEDURE:  The patient is taken to the operating room.  She is placed  in supine position.  General endotracheal anesthesia administered by the  anesthesia staff without difficulty.  Legs are positioned in the frog-  leg position.  Abdomen, peritoneum, , thighs, and vagina are prepped in  normal sterile fashion.  A Foley catheter is inserted under sterile  conditions.  The patient's legs are then straightened, and she is draped  in a normal sterile fashion.  Using a knife, Pfannenstiel skin incision  is made and carried down through subcutaneous fat and tissue.  Cautery  is used as necessary for hemostasis.  The fascia is identified, nicked  in the midline, and the fascial incision is extended laterally using  curved Mayo scissors.  Kocher clamps are applied to the superior aspect  of the fascial incision, and the underlying rectus muscles are dissected  off sharply.  In a similar fashion, Kocher clamps applied to the  inferior aspect  of the fascial incision, and the underlying rectus  muscles are dissected off sharply.  The preperitoneal fat is then  dissected and the peritoneum identified and entered sharply.  Peritoneal  incision is extended superiorly and inferiorly with care taken to note  location of the bladder.  At this point, the abdomen was surveyed.  There is fairly sizable omentum.  The liver edge is smooth.  The  gallbladder is decompressed.  Stomach edge is smooth.  There is no  nodularity noted.  At this point, the Balfour retractor is placed.  Then  upper blade is attached to this retractor.  Three moist laparotomy  sponges are used to pack the bowel anteriorly.  There are some filmy  bowel adhesions on the right and left aspect of the sidewall.  These are  freed on the left side and particularly because they are placed in  traction on the ovary  and fallopian tube.  Then a malleable retractor is  attached to the upper blade to help keep the bowel way.  The case  required a deep bladder blade, but we could not find one that worked  with the retractor.  Therefore, curved Deaver retractor was used to  retract the fatty tissue above the bladder to keep the bladder out of  the way during the procedure.   Long Kelly clamps are placed along the uteroovarian pedicle.  Attention  turned to the right side.  The right round ligament is stitched and the  stitch left long.  The round ligament is transected with cautery.  The  anterior lip of the broad ligament was opened using Metzenbaum scissors  and the posterior leaf was opened parallel to the IP ligament.  The IP  ligament is isolated after the ureter was identified.  Curved Heaney  clamp was placed across the pedicle.  The pedicle is transected,  stitched, and suture ligated, first with a free tie of #0 Vicryl in a  stitch tie.  This pedicle is hemostatic.  Then attention is turned to  the left side.  The left round ligament is suture ligated and the stitch  left long.  The round ligament is opened, and then the inferior leaf of  the broad ligament is opened using Metzenbaum scissors, and the  posterior leaf is opened parallel to the IP ligament.  The uterine  ovarian pedicle on the left side is isolated, clamped with a curved  Heaney clamp and pedicle transected.  The pedicle was stitched first  with free tie of #0 Vicryl in a stitch tie.   The uterine arteries are skeletonized bilaterally and clamped with  curved Heaney clamps after the pubovesical cervical fascia was dissected  and the bladder pushed down away from the area of dissection with a  sponge stick.  The glistening white tissue of the cervix was visualized  as this was performed.  Once the uterine arteries were clamped  bilaterally, they were transected and suture ligated with 2 stitch ties of #0 Vicryl.  The cardinal  ligaments were then serially clamped,  transected, and suture ligated with #0 Vicryl.  Further dissection of  the bladder flap was performed as necessary to keep the bladder out of  the way.  At the base of the cervix, curved Heaney clamps were placed.  These pedicles were transected, and a Heaney stitch of #0 Vicryl was  placed.  At this point, the bladder dissection was greater than 1 cm  below the area of the vaginal mucosa  that was being stitched.  Vaginal  mucosa of the vagina was entered.  The base of the cervix was  visualized.  Then using Jorgenson scissors and keeping close to the  cervix, the vaginal mucosa was incised circumferentially freeing the  cervix, and they are sent as specimen from the vaginal mucosa.  The  specimen was handed off.  This was the right fallopian tube, ovary,  cervix, and uterus which was sent to pathology for analysis.  At this  point, the corner of vaginal mucosa was stitched with a #0 Vicryl.  This  was attached to the __________  uterosacral ligament.  And this was  performed on the left side as well.  The remainder of the vaginal cuff  was closed with figure-of-eight sutures of #0 Vicryl.  Copious amounts  of warm normal saline were then used to irrigate the pelvis.  There was  a small amount of bleeding on the bladder flap where the dissection had  been performed.  Using DeBakey's and cautery, hemostasis was achieved.  A Gelfoam was placed between the bladder and the remainder of the  vaginal mucosa to ensure excellent hemostasis.  The uterine artery  pedicles were hemostatic.  The right IP and the left uteroovarian  pedicles were hemostatic.  At this point, the left ovary was attached to  the left round ligament to keep it up out of the pelvis.  At this point,  no bleeding noted, and the procedure was ended.  The Deaver retractor  was removed.  The upper blade and the malleable retractor was removed,  and the Balfour retractor was removed.  Then  the 3 moist laparotomy  sponges used to pack the bowel were removed without difficulty.  Bowel  and omentum were placed back in normal anatomic position.   Peritoneum was closed with running stitch of #0 Vicryl.   On-Q pump tubing was placed subfascially on the right side.  The tubing  was coiled and attached to the skin using Steri-Strips.  The fascia was  then closed starting from the left corner and running to the midline  with a running stitch of #0 Vicryl.  This was done also at the right  corner running to the midline.  A second On-Q pump tubing was placed on  the left side subcutaneously.  Again, the tubing was coiled and attached  to the skin using Steri-Strips.  The subcutaneous fat tissue was  irrigated.  No bleeding was noted.  The skin was closed with  subcuticular stitch of 3-0 Vicryl.  Then 5 mL of 1% lidocaine was  instilled in On-Q pump tubing on the left which was subcutaneously and 10 mL in the right tube which was subfascially.  The incision was then  cleansed, and Benzoin and dry Steri-Strips were applied.   Sponge, lap, needle, and instrument counts were correct x2.  The patient  tolerated the procedure very well.  She did well during the surgery.  She was awakened from anesthesia and extubated without difficulty.  The  patient was taken to the recovery room in stable condition.      Lum Keas, MD  Electronically Signed     MSM/MEDQ  D:  10/28/2006  T:  10/28/2006  Job:  [id number]

## 2011-03-15 DIAGNOSIS — F329 Major depressive disorder, single episode, unspecified: Secondary | ICD-10-CM | POA: Insufficient documentation

## 2011-03-15 DIAGNOSIS — F32A Depression, unspecified: Secondary | ICD-10-CM | POA: Insufficient documentation

## 2012-01-14 ENCOUNTER — Other Ambulatory Visit: Payer: Self-pay | Admitting: Obstetrics & Gynecology

## 2012-01-14 DIAGNOSIS — Z1231 Encounter for screening mammogram for malignant neoplasm of breast: Secondary | ICD-10-CM

## 2012-01-27 ENCOUNTER — Ambulatory Visit (INDEPENDENT_AMBULATORY_CARE_PROVIDER_SITE_OTHER): Payer: PRIVATE HEALTH INSURANCE | Admitting: Physician Assistant

## 2012-01-27 ENCOUNTER — Encounter: Payer: Self-pay | Admitting: Physician Assistant

## 2012-01-27 VITALS — BP 138/85 | HR 115 | Ht 64.0 in | Wt 210.0 lb

## 2012-01-27 DIAGNOSIS — R030 Elevated blood-pressure reading, without diagnosis of hypertension: Secondary | ICD-10-CM

## 2012-01-27 DIAGNOSIS — R748 Abnormal levels of other serum enzymes: Secondary | ICD-10-CM

## 2012-01-27 DIAGNOSIS — IMO0001 Reserved for inherently not codable concepts without codable children: Secondary | ICD-10-CM

## 2012-01-27 DIAGNOSIS — R7401 Elevation of levels of liver transaminase levels: Secondary | ICD-10-CM

## 2012-01-27 DIAGNOSIS — S30860A Insect bite (nonvenomous) of lower back and pelvis, initial encounter: Secondary | ICD-10-CM

## 2012-01-27 DIAGNOSIS — R079 Chest pain, unspecified: Secondary | ICD-10-CM

## 2012-01-27 DIAGNOSIS — E7800 Pure hypercholesterolemia, unspecified: Secondary | ICD-10-CM

## 2012-01-27 DIAGNOSIS — R0789 Other chest pain: Secondary | ICD-10-CM

## 2012-01-27 DIAGNOSIS — W57XXXA Bitten or stung by nonvenomous insect and other nonvenomous arthropods, initial encounter: Secondary | ICD-10-CM

## 2012-01-27 NOTE — Patient Instructions (Addendum)
Will schedule stress test. Call office if not called in one week.  Come in for spironmetry.   BP- low salt diet. Once stress test is done. Start regularly exercise. a week. Diet decrease saturated fats and fried foods. Will recheck cholesterol in 3 months. Come fasting for 8 hours.      1.5 Gram Low Sodium Diet A 1.5 gram sodium diet restricts the amount of sodium in the diet to no more than 1.5 g or 1500 mg daily. The American Heart Association recommends Americans over the age of 78 to consume no more than 1500 mg of sodium each day to reduce the risk of developing high blood pressure. Research also shows that limiting sodium may reduce heart attack and stroke risk. Many foods contain sodium for flavor and sometimes as a preservative. When the amount of sodium in a diet needs to be low, it is important to know what to look for when choosing foods and drinks. The following includes some information and guidelines to help make it easier for you to adapt to a low sodium diet. QUICK TIPS  Do not add salt to food.   Avoid convenience items and fast food.   Choose unsalted snack foods.   Buy lower sodium products, often labeled as "lower sodium" or "no salt added."   Check food labels to learn how much sodium is in 1 serving.   When eating at a restaurant, ask that your food be prepared with less salt or none, if possible.  READING FOOD LABELS FOR SODIUM INFORMATION The nutrition facts label is a good place to find how much sodium is in foods. Look for products with no more than 400 mg of sodium per serving. Remember that 1.5 g = 1500 mg. The food label may also list foods as:  Sodium-free: Less than 5 mg in a serving.   Very low sodium: 35 mg or less in a serving.   Low-sodium: 140 mg or less in a serving.   Light in sodium: 50% less sodium in a serving. For example, if a food that usually has 300 mg of sodium is changed to become light in sodium, it will have 150 mg of sodium.    Reduced sodium: 25% less sodium in a serving. For example, if a food that usually has 400 mg of sodium is changed to reduced sodium, it will have 300 mg of sodium.  CHOOSING FOODS Grains  Avoid: Salted crackers and snack items. Some cereals, including instant hot cereals. Bread stuffing and biscuit mixes. Seasoned rice or pasta mixes.   Choose: Unsalted snack items. Low-sodium cereals, oats, puffed wheat and rice, shredded wheat. English muffins and bread. Pasta.  Meats  Avoid: Salted, canned, smoked, spiced, pickled meats, including fish and poultry. Bacon, ham, sausage, cold cuts, hot dogs, anchovies.   Choose: Low-sodium canned tuna and salmon. Fresh or frozen meat, poultry, and fish.  Dairy  Avoid: Processed cheese and spreads. Cottage cheese. Buttermilk and condensed milk. Regular cheese.   Choose: Milk. Low-sodium cottage cheese. Yogurt. Sour cream. Low-sodium cheese.  Fruits and Vegetables  Avoid: Regular canned vegetables. Regular canned tomato sauce and paste. Frozen vegetables in sauces. Olives. Rosita Fire. Relishes. Sauerkraut.   Choose: Low-sodium canned vegetables. Low-sodium tomato sauce and paste. Frozen or fresh vegetables. Fresh and frozen fruit.  Condiments  Avoid: Canned and packaged gravies. Worcestershire sauce. Tartar sauce. Barbecue sauce. Soy sauce. Steak sauce. Ketchup. Onion, garlic, and table salt. Meat flavorings and tenderizers.   Choose: Fresh and  dried herbs and spices. Low-sodium varieties of mustard and ketchup. Lemon juice. Tabasco sauce. Horseradish.  SAMPLE 1.5 GRAM SODIUM MEAL PLAN Breakfast / Sodium (mg)  1 cup low-fat milk / 143 mg   1 whole-wheat English muffin / 240 mg   1 tbs heart-healthy margarine / 153 mg   1 hard-boiled egg / 139 mg   1 small orange / 0 mg  Lunch / Sodium (mg)  1 cup raw carrots / 76 mg   2 tbs no salt added peanut butter / 5 mg   2 slices whole-wheat bread / 270 mg   1 tbs jelly / 6 mg    cup red grapes  / 2 mg  Dinner / Sodium (mg)  1 cup whole-wheat pasta / 2 mg   1 cup low-sodium tomato sauce / 73 mg   3 oz lean ground beef / 57 mg   1 small side salad (1 cup raw spinach leaves,  cup cucumber,  cup yellow bell pepper) with 1 tsp olive oil and 1 tsp red wine vinegar / 25 mg  Snack / Sodium (mg)  1 container low-fat vanilla yogurt / 107 mg   3 graham cracker squares / 127 mg  Nutrient Analysis  Calories: 1745   Protein: 75 g   Carbohydrate: 237 g   Fat: 57 g   Sodium: 1425 mg  Document Released: 07/22/2005 Document Revised: 07/11/2011 Document Reviewed: 10/23/2009 [facility] Patient Information 2012 [place], [place].

## 2012-01-28 ENCOUNTER — Encounter: Payer: Self-pay | Admitting: Physician Assistant

## 2012-01-28 ENCOUNTER — Ambulatory Visit
Admission: RE | Admit: 2012-01-28 | Discharge: 2012-01-28 | Disposition: A | Discharge status: Home / Self Care | Payer: PRIVATE HEALTH INSURANCE | Source: Ambulatory Visit | Attending: Obstetrics & Gynecology | Admitting: Obstetrics & Gynecology

## 2012-01-28 DIAGNOSIS — Z1231 Encounter for screening mammogram for malignant neoplasm of breast: Secondary | ICD-10-CM

## 2012-01-28 LAB — COMPREHENSIVE METABOLIC PANEL
ALT: 30 U/L (ref 0–35)
AST: 29 U/L (ref 0–37)
Albumin: 4.5 g/dL (ref 3.5–5.2)
Alkaline Phosphatase: 69 U/L (ref 39–117)
BUN: 15 mg/dL (ref 6–23)
CO2: 26 mEq/L (ref 19–32)
Calcium: 9.9 mg/dL (ref 8.4–10.5)
Chloride: 105 mEq/L (ref 96–112)
Creat: 0.86 mg/dL (ref 0.50–1.10)
Glucose, Bld: 142 mg/dL — ABNORMAL HIGH (ref 70–99)
Potassium: 4 mEq/L (ref 3.5–5.3)
Sodium: 141 mEq/L (ref 135–145)
Total Bilirubin: 0.4 mg/dL (ref 0.3–1.2)
Total Protein: 7.4 g/dL (ref 6.0–8.3)

## 2012-01-28 LAB — B. BURGDORFI ANTIBODIES: B burgdorferi Ab IgG+IgM: 0.24 {ISR}

## 2012-01-29 NOTE — Patient Instructions (Addendum)
Follow up in 1 month for spirometry.  Will refer for stress test. Will get labs and call with results. Use albuterol as needed and let me know if helps. Will recheck cholesterol in 3 months. Work on diet changes and exercise regularly. Avoid salt. Will rehceck bp.   1.5 Gram Low Sodium Diet A 1.5 gram sodium diet restricts the amount of sodium in the diet to no more than 1.5 g or 1500 mg daily. The American Heart Association recommends Americans over the age of 60 to consume no more than 1500 mg of sodium each day to reduce the risk of developing high blood pressure. Research also shows that limiting sodium may reduce heart attack and stroke risk. Many foods contain sodium for flavor and sometimes as a preservative. When the amount of sodium in a diet needs to be low, it is important to know what to look for when choosing foods and drinks. The following includes some information and guidelines to help make it easier for you to adapt to a low sodium diet. QUICK TIPS  Do not add salt to food.   Avoid convenience items and fast food.   Choose unsalted snack foods.   Buy lower sodium products, often labeled as "lower sodium" or "no salt added."   Check food labels to learn how much sodium is in 1 serving.   When eating at a restaurant, ask that your food be prepared with less salt or none, if possible.  READING FOOD LABELS FOR SODIUM INFORMATION The nutrition facts label is a good place to find how much sodium is in foods. Look for products with no more than 400 mg of sodium per serving. Remember that 1.5 g = 1500 mg. The food label may also list foods as:  Sodium-free: Less than 5 mg in a serving.   Very low sodium: 35 mg or less in a serving.   Low-sodium: 140 mg or less in a serving.   Light in sodium: 50% less sodium in a serving. For example, if a food that usually has 300 mg of sodium is changed to become light in sodium, it will have 150 mg of sodium.   Reduced sodium: 25% less  sodium in a serving. For example, if a food that usually has 400 mg of sodium is changed to reduced sodium, it will have 300 mg of sodium.  CHOOSING FOODS Grains  Avoid: Salted crackers and snack items. Some cereals, including instant hot cereals. Bread stuffing and biscuit mixes. Seasoned rice or pasta mixes.   Choose: Unsalted snack items. Low-sodium cereals, oats, puffed wheat and rice, shredded wheat. English muffins and bread. Pasta.  Meats  Avoid: Salted, canned, smoked, spiced, pickled meats, including fish and poultry. Bacon, ham, sausage, cold cuts, hot dogs, anchovies.   Choose: Low-sodium canned tuna and salmon. Fresh or frozen meat, poultry, and fish.  Dairy  Avoid: Processed cheese and spreads. Cottage cheese. Buttermilk and condensed milk. Regular cheese.   Choose: Milk. Low-sodium cottage cheese. Yogurt. Sour cream. Low-sodium cheese.  Fruits and Vegetables  Avoid: Regular canned vegetables. Regular canned tomato sauce and paste. Frozen vegetables in sauces. Olives. Rosita Fire. Relishes. Sauerkraut.   Choose: Low-sodium canned vegetables. Low-sodium tomato sauce and paste. Frozen or fresh vegetables. Fresh and frozen fruit.  Condiments  Avoid: Canned and packaged gravies. Worcestershire sauce. Tartar sauce. Barbecue sauce. Soy sauce. Steak sauce. Ketchup. Onion, garlic, and table salt. Meat flavorings and tenderizers.   Choose: Fresh and dried herbs and spices. Low-sodium varieties of  mustard and ketchup. Lemon juice. Tabasco sauce. Horseradish.  SAMPLE 1.5 GRAM SODIUM MEAL PLAN Breakfast / Sodium (mg)  1 cup low-fat milk / 143 mg   1 whole-wheat English muffin / 240 mg   1 tbs heart-healthy margarine / 153 mg   1 hard-boiled egg / 139 mg   1 small orange / 0 mg  Lunch / Sodium (mg)  1 cup raw carrots / 76 mg   2 tbs no salt added peanut butter / 5 mg   2 slices whole-wheat bread / 270 mg   1 tbs jelly / 6 mg    cup red grapes / 2 mg  Dinner / Sodium  (mg)  1 cup whole-wheat pasta / 2 mg   1 cup low-sodium tomato sauce / 73 mg   3 oz lean ground beef / 57 mg   1 small side salad (1 cup raw spinach leaves,  cup cucumber,  cup yellow bell pepper) with 1 tsp olive oil and 1 tsp red wine vinegar / 25 mg  Snack / Sodium (mg)  1 container low-fat vanilla yogurt / 107 mg   3 graham cracker squares / 127 mg  Nutrient Analysis  Calories: 1745   Protein: 75 g   Carbohydrate: 237 g   Fat: 57 g   Sodium: 1425 mg  Document Released: 07/22/2005 Document Revised: 07/11/2011 Document Reviewed: 10/23/2009 [facility] Patient Information 2012 [place], [place].

## 2012-01-30 NOTE — Progress Notes (Signed)
  Subjective:    Patient ID: Tracy Landry, female    DOB: [birth year], 51 y.o.   MRN: [id number]  HPI Patient presents to the clinic to establish care. PMH was reviewed and + for GERD, allergies, Depression, joint pain, ankolysis spnodolylitis.  All problems are currently controlled with medications.   She recently went to ER for chest tightness and pain. She had a full work up that was negative for cardaic causes. She does have a dx of asthma but has also been told that she does not have asthma and denies any SOB or wheezing. She does not even have a rescue inhaler. She does find the pain to be worse when she exerts herself. CP feels more like a "heaviness" than pain. It is not contstant  But has been happening more frequently. She went to ED b/c she had the chest tightness/pain and then she started vomiting. She had been working outside all day.She denies any radiation of pain down arm or in jaw. BP was extremely elevated in hospital no on bp medication.  Liver enzymes were elevated at last blood draw needs to get rechecked.  Pulled tick off 2 weeks ago. Did not notice any rash. Denies fever, chills, headache. Always has joint pain. Area remains red around where she got bit. No pus or drainage from the bump. Wants to be tested for lyme's. Denies any itching or problems.  Recently had cholesterol drawn and reports it was elevated.    Review of Systems     Objective:   Physical Exam  Constitutional: She is oriented to person, place, and time. She appears well-developed and well-nourished.  HENT:  Head: Normocephalic and atraumatic.  Cardiovascular: Regular rhythm and normal heart sounds.        Tachycardia rechecked at 103.  Pulmonary/Chest: Effort normal and breath sounds normal. She has no wheezes.  Neurological: She is alert and oriented to person, place, and time.  Skin: Skin is warm and dry.       No edema in lower extremities.  Small red papule left lower abdomen. No blood or  pus.  Psychiatric: She has a normal mood and affect. Her behavior is normal.          Assessment & Plan:  Chest tightness and pain- Will refer for stress test. CMP was ordered. I feel pt needs to be evaluated for asthma with spironmetry. Schedule follow up appt to evaluate. Gave an albuterol inhaler for patient to use when she gets the chest tightness and see if it helps. Discussed proper usage of inhaler. Make sure that you stay hydrated. Will follow up in 1 month.  Elevated cholesterol- Will recheckin 3 months. Not aware of previous numbers but will give patient time to adjust diet and exercise to get numbers down before draw level.   Elevated liver enzymes- Will recheck today.  Elevated blood pressure- Avoid salt, processed foods, and fried foods. Discussed weight loss and how that can aid in decreasing blood pressure. Will recheck at follow up visit in 1 month.  Tick bite- Will get lyme's titer. I have very low suspsion of lymes but patient requested. Can use OTC hydrocortisone on bug bite.   Care teams: Dr. Demetrios Loll Rhematology Lung and sleep wellness Dr. Loreta Ave- GI doc Scharlene Corn

## 2012-01-31 ENCOUNTER — Other Ambulatory Visit: Payer: Self-pay | Admitting: Obstetrics & Gynecology

## 2012-01-31 DIAGNOSIS — R928 Other abnormal and inconclusive findings on diagnostic imaging of breast: Secondary | ICD-10-CM

## 2012-02-05 ENCOUNTER — Ambulatory Visit
Admission: RE | Admit: 2012-02-05 | Discharge: 2012-02-05 | Disposition: A | Discharge status: Home / Self Care | Payer: PRIVATE HEALTH INSURANCE | Source: Ambulatory Visit | Attending: Obstetrics & Gynecology | Admitting: Obstetrics & Gynecology

## 2012-02-05 DIAGNOSIS — R928 Other abnormal and inconclusive findings on diagnostic imaging of breast: Secondary | ICD-10-CM

## 2012-02-10 ENCOUNTER — Ambulatory Visit (INDEPENDENT_AMBULATORY_CARE_PROVIDER_SITE_OTHER): Payer: PRIVATE HEALTH INSURANCE | Admitting: Physician Assistant

## 2012-02-10 ENCOUNTER — Other Ambulatory Visit: Payer: PRIVATE HEALTH INSURANCE

## 2012-02-10 ENCOUNTER — Encounter: Payer: Self-pay | Admitting: Physician Assistant

## 2012-02-10 VITALS — BP 152/105 | HR 86 | Ht 64.0 in | Wt 212.0 lb

## 2012-02-10 DIAGNOSIS — I10 Essential (primary) hypertension: Secondary | ICD-10-CM

## 2012-02-10 DIAGNOSIS — M25552 Pain in left hip: Secondary | ICD-10-CM

## 2012-02-10 DIAGNOSIS — M25559 Pain in unspecified hip: Secondary | ICD-10-CM

## 2012-02-10 MED ORDER — LOSARTAN POTASSIUM-HCTZ 50-12.5 MG PO TABS: 1.0000 | ORAL_TABLET | Freq: Every day | ORAL | Status: DC

## 2012-02-10 NOTE — Patient Instructions (Addendum)
Need to schedule appt. With ortho to evaluate hip.   Start Hyzaar daily for bp. Will recheck with nurse visit on Friday. If controlled then continue with stress test if not then reschedule stress test.

## 2012-02-11 NOTE — Progress Notes (Signed)
  Subjective:    Patient ID: Tracy Landry, female    DOB: 1961-01-24, 51 y.o.   MRN: [id number]  HPI Patient presents to the clinic today because she has a bad headache, feels dizzy, and her left hip hurts. She has recently been to ED for chest tightness and has had cardiac work up with everything normal. She is scheduled for a stress test on Monday. She works outside in the hot sun everyday. Her hip continues to get worse as far as pain. She denies any fever, chills, myalgias. She still has chest tightness but no pain or radiation down arm. NO SOB. Denies any feelings of anxiety. Already on Dexilant.  Left hip pain is an ongoing problem. It has worsened since she has decreased Voltaran to once a day due to increased liver enzymes. Patient with Dr.wainer.    Review of Systems     Objective:   Physical Exam  Constitutional: She is oriented to person, place, and time. She appears well-developed and well-nourished.       Obese.  HENT:  Head: Normocephalic and atraumatic.  Eyes: Conjunctivae are normal.  Neck: Normal range of motion. Neck supple. No JVD present. No tracheal deviation present.  Cardiovascular: Normal rate, regular rhythm, normal heart sounds and intact distal pulses.   No murmur heard. Pulmonary/Chest: Effort normal and breath sounds normal. She has no wheezes.  Musculoskeletal:       Able to have full passive ROM, active ROM was limited by pain.. Mild pain with palpation over bursa of hip. Reports pain in the groin. Stregthn 5/5.   Neurological: She is alert and oriented to person, place, and time.  Skin: Skin is warm and dry.       Face was flushed today.  Psychiatric: She has a normal mood and affect. Her behavior is normal.          Assessment & Plan:  Hypertension-Start Hyzaar daily for blood pressure. Will recheck with nurse visit on Friday. If controlled then continue with stress test if not then reschedule stress test until we can get BP more controlled.  Avoid salt. Discussed with patient that NSAIDS increase blood pressure. Patient is on Voltaran and has been for years. She is only taking once a day and she does agree not to take any extra ibuprofen. Discussed that she can take tylenol if she for headache and joint pain. Will consider renal ultrasound to evaluate kidneys if BP not responding to treatment.   Left hip pain- Long hx of joint pain and problems. Has been seen by Theodis Shove for other joint issues in the past. Pt is going to make appt with Dr. Thurston Hole for evaluation.

## 2012-02-17 ENCOUNTER — Encounter: Payer: PRIVATE HEALTH INSURANCE | Admitting: Physician Assistant

## 2012-02-17 ENCOUNTER — Other Ambulatory Visit: Payer: PRIVATE HEALTH INSURANCE | Admitting: Physician Assistant

## 2012-02-24 ENCOUNTER — Telehealth: Payer: Self-pay | Admitting: Family Medicine

## 2012-02-24 NOTE — Telephone Encounter (Signed)
Devon Kingdon called and just wanted to advise you that the blood pressure pills prescribed are working. Thanks

## 2012-02-26 ENCOUNTER — Other Ambulatory Visit: Payer: PRIVATE HEALTH INSURANCE | Admitting: Physician Assistant

## 2012-03-05 ENCOUNTER — Other Ambulatory Visit: Payer: Self-pay | Admitting: Physician Assistant

## 2012-03-10 ENCOUNTER — Other Ambulatory Visit: Payer: Self-pay | Admitting: *Deleted

## 2012-03-10 MED ORDER — LOSARTAN POTASSIUM-HCTZ 50-12.5 MG PO TABS: 1.0000 | ORAL_TABLET | Freq: Every day | ORAL | Status: DC

## 2012-03-30 ENCOUNTER — Telehealth: Payer: Self-pay | Admitting: *Deleted

## 2012-03-30 DIAGNOSIS — R000 Tachycardia, unspecified: Secondary | ICD-10-CM

## 2012-03-30 NOTE — Telephone Encounter (Signed)
Pt calls stating she had to go back to the hosp for tachycardia. Hosp started her on metoprolol and potassium and scheduled her w/ cardiology. Pt already has upcoming stress test w/ [facility] that we scheduled her for. Pt would like to cancel card apt thru [facility] and would like to see cards at [facility]. Cards referral has already been entered.

## 2012-04-01 NOTE — Telephone Encounter (Signed)
Follow up with me after cardiology appt.

## 2012-04-03 ENCOUNTER — Encounter: Payer: Self-pay | Admitting: Physician Assistant

## 2012-04-03 ENCOUNTER — Ambulatory Visit (INDEPENDENT_AMBULATORY_CARE_PROVIDER_SITE_OTHER): Payer: PRIVATE HEALTH INSURANCE | Admitting: Physician Assistant

## 2012-04-03 VITALS — BP 133/83 | HR 83 | Temp 98.2°F | Wt 210.0 lb

## 2012-04-03 DIAGNOSIS — E876 Hypokalemia: Secondary | ICD-10-CM

## 2012-04-03 DIAGNOSIS — H6691 Otitis media, unspecified, right ear: Secondary | ICD-10-CM

## 2012-04-03 DIAGNOSIS — H6690 Otitis media, unspecified, unspecified ear: Secondary | ICD-10-CM

## 2012-04-03 DIAGNOSIS — J40 Bronchitis, not specified as acute or chronic: Secondary | ICD-10-CM

## 2012-04-03 DIAGNOSIS — R000 Tachycardia, unspecified: Secondary | ICD-10-CM

## 2012-04-03 LAB — POTASSIUM: Potassium: 4.5 mEq/L (ref 3.5–5.3)

## 2012-04-03 MED ORDER — AMOXICILLIN-POT CLAVULANATE 875-125 MG PO TABS: 1.0000 | ORAL_TABLET | Freq: Two times a day (BID) | ORAL | Status: DC

## 2012-04-03 MED ORDER — HYDROCODONE-HOMATROPINE 5-1.5 MG/5ML PO SYRP: 5.0000 mL | ORAL_SOLUTION | Freq: Every evening | ORAL | Status: AC | PRN

## 2012-04-03 NOTE — Progress Notes (Signed)
  Subjective:    Patient ID: Tracy Landry, female    DOB: 1961/03/20, 51 y.o.   MRN: [id number]  HPI Pt presents to the clinic with 5 days of fever, cough, SOB. Her fever has gotten up to 101.4. It is controlled today with tylenol. She does have a hx of asthma and she has been using inhaler 1-2 times a day. She has a mild sore throat and her ears are ringing but her right ear is very painful. She has not noticed any drainage coming from ear. Her cough is productive. She denies any GI symptoms. She has not tried anything by mouth to make better except for tylenol for fever. She has also had a off and on headache. She has not had any GI symptoms, SOB, Chest pains, or palpitations.   She did have to go to ER about 3 weeks ago per pt for episode of dizziness and feeling like her heart was beating out of her chest. Her HR was 198. She was put on Metoprolol and kdur for rate and hypokalemia. Her HR has stayed below 100 since and she has appt with cardiologist next week. She is scheduled for stress test sept 11th.    Review of Systems     Objective:   Physical Exam  Constitutional: She is oriented to person, place, and time. She appears well-developed and well-nourished.       Obese.  HENT:  Head: Normocephalic and atraumatic.  Left Ear: External ear normal.       TM dull and erythematous on right side. TM normal on left. Oropharynx red without exudate. Turbinates red and swollen. NO maxillary or frontal tenderness.  Eyes: Conjunctivae are normal.  Neck: Normal range of motion. Neck supple.       Right sided lymphadenopathy with tenderness to palpation.  Cardiovascular: Normal rate, regular rhythm, normal heart sounds and intact distal pulses.   Pulmonary/Chest: Effort normal. She has no wheezes.       NO wheezing heard. Coarse breath sounds cleared with cough.  Lymphadenopathy:    She has cervical adenopathy.  Neurological: She is alert and oriented to person, place, and time.  Skin: Skin  is warm and dry.  Psychiatric: She has a normal mood and affect. Her behavior is normal.          Assessment & Plan:  Right otitis media/Bronchitis- Augmentin for 10 days. Mucinex and drink lots of water. Tylenol for HA and fever. Call if not improving or worsening. Continue to use albuterol inhaler as needed. If not able to go back to not using inhaler then come in to recheck asthma symptoms. Continue to use nasal spray at home. Gave cough syrup only to use at night.  Hypokalemia- Will recheck potassium to make sure not changes need to be made to potassium dose.   History of tachycardia- Will f/u with cardiologist and get stress test. Continue on metoprolol.

## 2012-04-03 NOTE — Patient Instructions (Addendum)
Augmentin for 10 days twice a day. Will give you cough syrup to take before bedtime.  Mucinex twice a day and drinking lots of water. Use nasal spray.

## 2012-04-04 NOTE — Progress Notes (Signed)
Quick Note:  Call patient with results. Let them know all labs are within normal limits. ______ 

## 2012-04-08 ENCOUNTER — Telehealth: Payer: Self-pay | Admitting: Physician Assistant

## 2012-04-08 DIAGNOSIS — R7989 Other specified abnormal findings of blood chemistry: Secondary | ICD-10-CM

## 2012-04-08 NOTE — Telephone Encounter (Signed)
Call pt: I looked through hospital records and saw that TSH was mildly elevated. We need to recheck this. Come have lab drawn to confirm elevation.   Needs TSH. Free T4.

## 2012-04-10 ENCOUNTER — Encounter: Payer: Self-pay | Admitting: Physician Assistant

## 2012-04-10 ENCOUNTER — Ambulatory Visit (INDEPENDENT_AMBULATORY_CARE_PROVIDER_SITE_OTHER): Payer: PRIVATE HEALTH INSURANCE | Admitting: Physician Assistant

## 2012-04-10 DIAGNOSIS — R079 Chest pain, unspecified: Secondary | ICD-10-CM

## 2012-04-10 DIAGNOSIS — R0789 Other chest pain: Secondary | ICD-10-CM

## 2012-04-10 NOTE — Procedures (Signed)
Tracy Landry is a 51 y.o. female referred by PCP for ETT.  No hx of CAD, DM2, HL.  Recently dx with HTN.  Recent trip to [facility] ED with episode of tachycardia.  Symptoms sound c/w PSVT.  Patient reports IV medication broke episode (? Adenosine).  Notes chest pain with episode.  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  No exertional CP. No syncope.  No significant FHx of CAD.  Never been a smoker.  Exam unremarkable.  Of note, patient has appt to see Dr. Olga Millers soon.  Exercise Treadmill Test  Pre-Exercise Testing Evaluation Rhythm: normal sinus  Rate: 76   PR:  .13 QRS:  .08  QT:  .40 QTc: .45     Test  Exercise Tolerance Test Ordering MD: Tandy Gaw, MD  Interpreting MD: Tereso Newcomer , PA-C  Unique Test No: 1  Treadmill:  1  Indication for ETT: chest pain - rule out ischemia  Contraindication to ETT: No   Stress Modality: exercise - treadmill  Cardiac Imaging Performed: non   Protocol: standard Bruce - maximal  Max BP:  153/54  Max MPHR (bpm):  171 85% MPR (bpm):  145  MPHR obtained (bpm):  148 % MPHR obtained:  86%  Reached 85% MPHR (min:sec):  7:40 Total Exercise Time (min-sec):  8:00  Workload in METS:  10.1 Borg Scale: 17  Reason ETT Terminated:  patient's desire to stop    ST Segment Analysis At Rest: normal ST segments - no evidence of significant ST depression With Exercise: no evidence of significant ST depression  Other Information Arrhythmia:  No Angina during ETT:  absent (0) Quality of ETT:  diagnostic  ETT Interpretation:  normal - no evidence of ischemia by ST analysis  Comments: Good exercise tolerance. No chest pain. Normal BP response to exercise. No ST-T changes to suggest ischemia.   Recommendations: Follow up with [facility], JADE, PA-C and Dr. Olga Millers as directed. Tereso Newcomer, PA-C  9:17 AM 04/10/2012

## 2012-04-11 ENCOUNTER — Other Ambulatory Visit: Payer: Self-pay | Admitting: Physician Assistant

## 2012-04-13 ENCOUNTER — Encounter: Payer: Self-pay | Admitting: Physician Assistant

## 2012-04-13 ENCOUNTER — Ambulatory Visit (INDEPENDENT_AMBULATORY_CARE_PROVIDER_SITE_OTHER): Payer: PRIVATE HEALTH INSURANCE | Admitting: Physician Assistant

## 2012-04-13 VITALS — BP 124/81 | HR 76 | Temp 98.1°F | Ht 64.0 in | Wt 213.0 lb

## 2012-04-13 DIAGNOSIS — J302 Other seasonal allergic rhinitis: Secondary | ICD-10-CM

## 2012-04-13 DIAGNOSIS — H6980 Other specified disorders of Eustachian tube, unspecified ear: Secondary | ICD-10-CM

## 2012-04-13 DIAGNOSIS — J309 Allergic rhinitis, unspecified: Secondary | ICD-10-CM

## 2012-04-13 MED ORDER — LOSARTAN POTASSIUM-HCTZ 50-12.5 MG PO TABS: 1.0000 | ORAL_TABLET | Freq: Every day | ORAL | Status: DC

## 2012-04-13 NOTE — Progress Notes (Signed)
  Subjective:    Patient ID: Tracy Landry, female    DOB: July 03, 1961, 51 y.o.   MRN: [id number]  HPI Patient is a 51 yo female who presents to the clinic with bilateral ear itchness, pain, and ringing in ears. Right is worse than left. She does complain of residual cough that is dry now. She denies any fever, chills, muscle aches. Denies any sinus pressure, ST. She does work outside Engineer, manufacturing. She take singulair and it does help. Has not been taking Claritin. Has been using flonase daily.   Review of Systems     Objective:   Physical Exam  Constitutional: She is oriented to person, place, and time. She appears well-developed and well-nourished.       obsese  HENT:  Head: Normocephalic and atraumatic.  Right Ear: External ear normal.  Left Ear: External ear normal.  Mouth/Throat: Oropharynx is clear and moist. No oropharyngeal exudate.       TM's were normal bilaterally. Bilateral turbinates are red and swollen. No tenderness with maxillary or frontal sinuses to palpation.  Eyes:       Eyes appear to be watery bilaterally with no injection.  Cardiovascular: Normal rate, regular rhythm, normal heart sounds and intact distal pulses.   Pulmonary/Chest: Effort normal and breath sounds normal. She has no wheezes.  Neurological: She is alert and oriented to person, place, and time.  Skin: Skin is warm and dry.  Psychiatric: She has a normal mood and affect. Her behavior is normal.          Assessment & Plan:  Euchstation tube dysfunction/Seasonal allergies- I want her to add Claritin back to the mix daily. I gave sample of Zetonna to use daily. Let's see if this helps any of the symptoms. I believe a decongestant would work but due to BP and pulse we cannot do this. F/U if not improving.

## 2012-04-13 NOTE — Patient Instructions (Addendum)
Try Dede Query. Call if not improving. Consider taking Claritin and Singulair.

## 2012-04-14 LAB — T4, FREE: Free T4: 1.03 ng/dL (ref 0.80–1.80)

## 2012-04-14 LAB — T3, FREE: T3, Free: 3 pg/mL (ref 2.3–4.2)

## 2012-04-14 LAB — TSH: TSH: 3.058 u[IU]/mL (ref 0.350–4.500)

## 2012-04-15 ENCOUNTER — Ambulatory Visit (INDEPENDENT_AMBULATORY_CARE_PROVIDER_SITE_OTHER): Payer: PRIVATE HEALTH INSURANCE | Admitting: Cardiology

## 2012-04-15 ENCOUNTER — Encounter: Payer: Self-pay | Admitting: Cardiology

## 2012-04-15 VITALS — BP 127/83 | HR 106 | Ht 64.0 in | Wt 211.0 lb

## 2012-04-15 DIAGNOSIS — E785 Hyperlipidemia, unspecified: Secondary | ICD-10-CM

## 2012-04-15 DIAGNOSIS — I10 Essential (primary) hypertension: Secondary | ICD-10-CM | POA: Insufficient documentation

## 2012-04-15 DIAGNOSIS — I498 Other specified cardiac arrhythmias: Secondary | ICD-10-CM

## 2012-04-15 DIAGNOSIS — R5383 Other fatigue: Secondary | ICD-10-CM

## 2012-04-15 DIAGNOSIS — I471 Supraventricular tachycardia, unspecified: Secondary | ICD-10-CM | POA: Insufficient documentation

## 2012-04-15 DIAGNOSIS — R5381 Other malaise: Secondary | ICD-10-CM

## 2012-04-15 LAB — CBC
HCT: 38.8 % (ref 36.0–46.0)
Hemoglobin: 13.1 g/dL (ref 12.0–15.0)
MCH: 31.7 pg (ref 26.0–34.0)
MCHC: 33.8 g/dL (ref 30.0–36.0)
MCV: 93.9 fL (ref 78.0–100.0)
Platelets: 384 10*3/uL (ref 150–400)
RBC: 4.13 MIL/uL (ref 3.87–5.11)
RDW: 13.3 % (ref 11.5–15.5)
WBC: 9.2 10*3/uL (ref 4.0–10.5)

## 2012-04-15 MED ORDER — METOPROLOL SUCCINATE ER 50 MG PO TB24: 50.0000 mg | ORAL_TABLET | Freq: Every day | ORAL | Status: DC

## 2012-04-15 NOTE — Progress Notes (Signed)
HPI: 51 year old female for evaluation of SVT. TSH in September 2013 normal at 3.058. Over the past 2 months she has had 3 separate episodes of palpitations. They're sudden in onset and described as her heart pounding. There is associated dizziness and dyspnea but no chest pain. Most recently she went to the emergency room in [place]. I do not have those records available but she apparently received adenosine and her SVT resolved. She otherwise has mild dyspnea with more extreme activities but not routine activities. No orthopnea, PND, pedal edema, exertional chest pain or syncope. She did have an exercise treadmill on September 6 that showed no chest pain, normal blood pressure response and no ST changes.  Current Outpatient Prescriptions  Medication Sig Dispense Refill  . albuterol (PROVENTIL HFA;VENTOLIN HFA) 108 (90 BASE) MCG/ACT inhaler Inhale 2 puffs into the lungs every 6 (six) hours as needed.      . cyclobenzaprine (FLEXERIL) 10 MG tablet Take 10 mg by mouth 3 (three) times daily as needed.      Marland Kitchen dexlansoprazole (DEXILANT) 60 MG capsule Take 60 mg by mouth daily.      . diclofenac (VOLTAREN) 75 MG EC tablet Take 75 mg by mouth daily.       Marland Kitchen losartan-hydrochlorothiazide (HYZAAR) 50-12.5 MG per tablet Take 1 tablet by mouth daily.  30 tablet  5  . metoprolol (LOPRESSOR) 50 MG tablet Take 50 mg by mouth daily.       . montelukast (SINGULAIR) 10 MG tablet Take 10 mg by mouth at bedtime.      Marland Kitchen venlafaxine (EFFEXOR) 75 MG tablet Take 75 mg by mouth daily.         Allergies  Allergen Reactions  . Codeine Sulfate     REACTION: itching    Past Medical History  Diagnosis Date  . GERD (gastroesophageal reflux disease)   . Allergy   . Asthma   . Depression   . Back pain   . Hypertension     Past Surgical History  Procedure Date  . Abdominal hysterectomy   . Right arm reconstruction 2002  . Foot surgery   . Appendectomy     History   Social History  . Marital Status:  Single    Spouse Name: N/A    Number of Children: N/A  . 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 Smoking status: Never Smoker   . Smokeless tobacco: Not on file  . Alcohol Use: 3.6 oz/week    6 Glasses of wine per week     1-2 glasses wine per night  . Drug Use: Not on file  . Sexually Active: Yes   Other Topics Concern  . Not on file   Social History Narrative  . No narrative on file    Family History  Problem Relation Age of Onset  . Alcohol abuse Mother   . Diabetes Mother   . Hyperlipidemia Mother   . Hypertension Mother   . Alzheimer's disease Father   . Diabetes Sister   . Hyperlipidemia Sister   . Hypertension Sister   . Heart attack Maternal Uncle   . Cancer Maternal Grandmother     lung  . Heart attack Maternal Grandfather   . Heart disease Mother     Rheumatic disease    ROS: some arthralgias but no fevers or chills, productive cough, hemoptysis, dysphasia, odynophagia, melena, hematochezia, dysuria, hematuria, rash, seizure  activity, orthopnea, PND, pedal edema, claudication. Remaining systems are negative.  Physical Exam:   Blood pressure 127/83, pulse 106, height 5\' 4"  (1.626 m), weight 211 lb (95.709 kg).  General:  Well developed/obese in NAD Skin warm/dry Patient not depressed No peripheral clubbing Back-normal HEENT-normal/normal eyelids Neck supple/normal carotid upstroke bilaterally; no bruits; no JVD; no thyromegaly chest - CTA/ normal expansion CV - RRR/normal S1 and S2; no murmurs, rubs or gallops;  PMI nondisplaced Abdomen -NT/ND, no HSM, no mass, + bowel sounds, no bruit 2+ femoral pulses, no bruits Ext-no edema, chords, 2+ DP Neuro-grossly nonfocal  ECG 03/16/2012-sinus rhythm at a rate of 89. No significant ST changes. Rhythm strip and ECG on 03/15/2012 shows SVT at a rate of 183.

## 2012-04-15 NOTE — Assessment & Plan Note (Signed)
Plan continue present medications with the addition of Toprol.

## 2012-04-15 NOTE — Patient Instructions (Addendum)
Your physician recommends that you schedule a follow-up appointment in: 3 MONTHS WITH DR Jens Som IN [place]  Your physician has requested that you have an echocardiogram. Echocardiography is a painless test that uses sound waves to create images of your heart. It provides your doctor with information about the size and shape of your heart and how well your heart's chambers and valves are working. This procedure takes approximately one hour. There are no restrictions for this procedure.   Your physician recommends that you HAVE LAB WORK TODAY  START METOPROLOL SUCC 50 MG ONCE DAILY WITH FOOD

## 2012-04-15 NOTE — Assessment & Plan Note (Signed)
Recent TSH normal. Check hemoglobin.

## 2012-04-15 NOTE — Assessment & Plan Note (Signed)
Management per primary care. 

## 2012-04-15 NOTE — Assessment & Plan Note (Signed)
Patient has documented SVT. 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 Check echocardiogram. Begin Toprol 50 mg daily. If she has more frequent episodes despite medical therapy will ask electrophysiology to review for consideration of ablation.

## 2012-04-17 ENCOUNTER — Telehealth: Payer: Self-pay | Admitting: *Deleted

## 2012-04-17 ENCOUNTER — Encounter: Payer: Self-pay | Admitting: Cardiology

## 2012-04-17 DIAGNOSIS — H6980 Other specified disorders of Eustachian tube, unspecified ear: Secondary | ICD-10-CM

## 2012-04-17 DIAGNOSIS — J302 Other seasonal allergic rhinitis: Secondary | ICD-10-CM

## 2012-04-17 DIAGNOSIS — R0982 Postnasal drip: Secondary | ICD-10-CM

## 2012-04-17 MED ORDER — METHYLPREDNISOLONE 4 MG PO KIT: PACK | ORAL | Status: AC

## 2012-04-17 NOTE — Telephone Encounter (Signed)
LMOM informing Pt  

## 2012-04-17 NOTE — Telephone Encounter (Signed)
Ok I will go ahead and give a steroid to start. Continue with all other treatments. Will refer to ENT. If feeling better after starting steroid then can cancel appt.

## 2012-04-17 NOTE — Telephone Encounter (Signed)
Partner calls stating Pt is no better and would like referral to ENT ASAP. They plan to go out of town next Thurs- following Monday but can make any other scheduled apt.

## 2012-05-01 ENCOUNTER — Ambulatory Visit (HOSPITAL_COMMUNITY): Payer: PRIVATE HEALTH INSURANCE | Attending: Internal Medicine | Admitting: Radiology

## 2012-05-01 DIAGNOSIS — R079 Chest pain, unspecified: Secondary | ICD-10-CM | POA: Insufficient documentation

## 2012-05-01 DIAGNOSIS — I471 Supraventricular tachycardia: Secondary | ICD-10-CM

## 2012-05-01 DIAGNOSIS — E785 Hyperlipidemia, unspecified: Secondary | ICD-10-CM | POA: Insufficient documentation

## 2012-05-01 DIAGNOSIS — R42 Dizziness and giddiness: Secondary | ICD-10-CM | POA: Insufficient documentation

## 2012-05-01 DIAGNOSIS — I10 Essential (primary) hypertension: Secondary | ICD-10-CM | POA: Insufficient documentation

## 2012-05-01 DIAGNOSIS — R002 Palpitations: Secondary | ICD-10-CM

## 2012-05-01 DIAGNOSIS — E669 Obesity, unspecified: Secondary | ICD-10-CM | POA: Insufficient documentation

## 2012-05-01 NOTE — Progress Notes (Signed)
Echocardiogram performed.  

## 2012-07-15 ENCOUNTER — Encounter: Payer: Self-pay | Admitting: Cardiology

## 2012-07-15 ENCOUNTER — Ambulatory Visit (INDEPENDENT_AMBULATORY_CARE_PROVIDER_SITE_OTHER): Payer: PRIVATE HEALTH INSURANCE | Admitting: Cardiology

## 2012-07-15 VITALS — BP 114/76 | HR 80 | Wt 223.0 lb

## 2012-07-15 DIAGNOSIS — I498 Other specified cardiac arrhythmias: Secondary | ICD-10-CM

## 2012-07-15 DIAGNOSIS — E785 Hyperlipidemia, unspecified: Secondary | ICD-10-CM

## 2012-07-15 DIAGNOSIS — I10 Essential (primary) hypertension: Secondary | ICD-10-CM

## 2012-07-15 DIAGNOSIS — I471 Supraventricular tachycardia: Secondary | ICD-10-CM

## 2012-07-15 NOTE — Assessment & Plan Note (Signed)
Patient continues to have episodes of SVT despite beta-blockade. She has had 4 in the past several months. I will continue with her Toprol. I will arrange for her to see one of our electrophysiologists for consideration of ablation.

## 2012-07-15 NOTE — Assessment & Plan Note (Signed)
Blood pressure controlled.continue present medications. 

## 2012-07-15 NOTE — Progress Notes (Signed)
HPI: Pleasant female for fu of SVT. Electrocardiogram on 03/15/2012 showed supraventricular tachycardia at a rate of 183. TSH in September 2013 normal at 3.058.  She did have an exercise treadmill on September 6 that showed no chest pain, normal blood pressure response and no ST changes. Echocardiogram in September of 2013 showed normal LV function and grade 1 diastolic dysfunction. When I last saw her in September of 2013 we added a beta blocker. Since that time, there is no dyspnea on exertion, orthopnea, PND, pedal edema or exertional chest pain. She has had 4 episodes of SVT. 3 have awakened her from sleep and described as heart racing with associated chest tightness. A fourth episode occurred after drinking a cold drink. She was able to terminate each of these with Valsalva. No syncope.   Current Outpatient Prescriptions  Medication Sig Dispense Refill  . albuterol (PROVENTIL HFA;VENTOLIN HFA) 108 (90 BASE) MCG/ACT inhaler Inhale 2 puffs into the lungs every 6 (six) hours as needed.      . cyclobenzaprine (FLEXERIL) 10 MG tablet Take 10 mg by mouth 3 (three) times daily as needed.      Marland Kitchen dexlansoprazole (DEXILANT) 60 MG capsule Take 60 mg by mouth daily.      . diclofenac (VOLTAREN) 75 MG EC tablet Take 75 mg by mouth daily.       Marland Kitchen losartan-hydrochlorothiazide (HYZAAR) 50-12.5 MG per tablet Take 1 tablet by mouth daily.  30 tablet  5  . metoprolol succinate (TOPROL-XL) 50 MG 24 hr tablet Take 1 tablet (50 mg total) by mouth daily. Take with or immediately following a meal.  90 tablet  3  . montelukast (SINGULAIR) 10 MG tablet Take 10 mg by mouth at bedtime.      Marland Kitchen venlafaxine (EFFEXOR) 75 MG tablet Take 75 mg by mouth daily.          Past Medical History  Diagnosis Date  . GERD (gastroesophageal reflux disease)   . Allergy   . Asthma   . Depression   . Back pain   . Hypertension   . SVT (supraventricular tachycardia)     Past Surgical History  Procedure Date  . Abdominal  hysterectomy   . Right arm reconstruction 2002  . Foot surgery   . Appendectomy     History   Social History  . Marital Status: Single    Spouse Name: N/A    Number of Children: N/A  . 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 Smoking status: Never Smoker   . Smokeless tobacco: Not on file  . Alcohol Use: 3.6 oz/week    6 Glasses of wine per week     Comment: 1-2 glasses wine per night  . Drug Use: Not on file  . Sexually Active: Yes   Other Topics Concern  . Not on file   Social History Narrative  . No narrative on file    ROS: no fevers or chills, productive cough, hemoptysis, dysphasia, odynophagia, melena, hematochezia, dysuria, hematuria, rash, seizure activity, orthopnea, PND, pedal edema, claudication. Remaining systems are negative.  Physical Exam: Well-developed well-nourished in no acute distress.  Skin is warm and dry.  HEENT is normal.  Neck is supple.  Chest is clear to auscultation with normal expansion.  Cardiovascular exam is regular rate and rhythm.  Abdominal exam nontender or distended. No masses palpated. Extremities show no edema. neuro grossly intact  ECG sinus rhythm at a rate of 80. No ST changes

## 2012-07-15 NOTE — Assessment & Plan Note (Signed)
Management per primary care. 

## 2012-07-15 NOTE — Patient Instructions (Addendum)
Your physician recommends that you schedule a follow-up appointment in: AS NEEDED  REFERRAL TO DR Lewayne Bunting

## 2012-07-27 ENCOUNTER — Ambulatory Visit (INDEPENDENT_AMBULATORY_CARE_PROVIDER_SITE_OTHER): Payer: PRIVATE HEALTH INSURANCE | Admitting: Physician Assistant

## 2012-07-27 ENCOUNTER — Encounter: Payer: Self-pay | Admitting: Physician Assistant

## 2012-07-27 VITALS — BP 145/81 | HR 94 | Temp 98.5°F | Ht 64.0 in | Wt 220.0 lb

## 2012-07-27 DIAGNOSIS — R05 Cough: Secondary | ICD-10-CM

## 2012-07-27 DIAGNOSIS — R059 Cough, unspecified: Secondary | ICD-10-CM

## 2012-07-27 DIAGNOSIS — J45901 Unspecified asthma with (acute) exacerbation: Secondary | ICD-10-CM

## 2012-07-27 DIAGNOSIS — J069 Acute upper respiratory infection, unspecified: Secondary | ICD-10-CM

## 2012-07-27 MED ORDER — HYDROCODONE-HOMATROPINE 5-1.5 MG/5ML PO SYRP: 5.0000 mL | ORAL_SOLUTION | Freq: Every evening | ORAL | Status: DC | PRN

## 2012-07-27 MED ORDER — MONTELUKAST SODIUM 10 MG PO TABS: 10.0000 mg | ORAL_TABLET | Freq: Every day | ORAL | Status: DC

## 2012-07-27 MED ORDER — AZITHROMYCIN 250 MG PO TABS: ORAL_TABLET | ORAL | Status: DC

## 2012-07-27 MED ORDER — PREDNISONE 20 MG PO TABS: 20.0000 mg | ORAL_TABLET | Freq: Two times a day (BID) | ORAL | Status: DC

## 2012-07-27 NOTE — Progress Notes (Signed)
  Subjective:    Patient ID: Tracy Landry, female    DOB: [birth year], 51 y.o.   MRN: [id number]  HPI Patient is a 51 year old female who presents to the clinic with cough and shortness of breath for the last 3 days. She has a history of asthma and exacerbations. She denies any wheezing but reports her chest feeling tight. She does have sinus pressure and left ear pain. She denies any sore throat. She does feel like her muscles her begin ache and has ran a low-grade fever of around 99-100 for the past 2 days. She does have Tylenol today at noon. She has not been using her inhaler. She has tried over-the-counter Mucinex without D. it has helped some with congestion but not chest tightness. Patient really wants to be seen because they are leaving tomorrow to go out of town for a ten-day road trip.    Review of Systems     Objective:   Physical Exam  Constitutional: She is oriented to person, place, and time. She appears well-developed and well-nourished.       Obese.  HENT:  Head: Normocephalic and atraumatic.  Right Ear: External ear normal.  Left Ear: External ear normal.       TMs clear bilaterally. Nasal turbinates red and swollen.  Oropharynx red and tonsils and large without exudate.  Eyes: Conjunctivae normal are normal.  Neck: Normal range of motion. Neck supple.  Cardiovascular: Normal rate, regular rhythm and normal heart sounds.   Pulmonary/Chest: Effort normal and breath sounds normal.       Dry cough multiple times throughout the interview. Slight wheezing heard with inspiration at the base of both lungs bilaterally. Decreased air movement auscultated.  Lymphadenopathy:    She has no cervical adenopathy.  Neurological: She is alert and oriented to person, place, and time.  Skin: Skin is warm and dry.       Cheeks very flushed.  Psychiatric: She has a normal mood and affect. Her behavior is normal.          Assessment & Plan:  Asthma  exacerbation/cough/URI-encouraged patient that the best treatment is to start using her albuterol inhaler regularly twice a day for the next 7-10 days. Will add prednisone for 5 days to get ahead of the inflammation. Encouraged patient to call in suspect most cough is due to bronchial restriction. Will give Hycodan to take only at night for cough. Discussed with the patient that I suspect this is viral that is leading to an asthma exacerbation. I know they're going out of town for the next 10 days. Do not want patient to get worse while out of town. I will get a prescription for Z-Pak to take only if symptoms persist more than 7-10 days. Patient was made aware of antibiotic resistance and the importance of waiting to use antibiotic. Patient was given symptomatic care handout. Singulair was refilled.

## 2012-07-27 NOTE — Patient Instructions (Addendum)
Albuterol use it twice a day for 7 days or so. Start prednisone now. Hycodan at night. And Zpak if not better in 7-10 days.   Upper Respiratory Infection, Adult An upper respiratory infection (URI) is also known as the common cold. It is often caused by a type of germ (virus). Colds are easily spread (contagious). You can pass it to others by kissing, coughing, sneezing, or drinking out of the same glass. Usually, you get better in 1 or 2 weeks.  HOME CARE   Only take medicine as told by your doctor.  Use a warm mist humidifier or breathe in steam from a hot shower.  Drink enough water and fluids to keep your pee (urine) clear or pale yellow.  Get plenty of rest.  Return to work when your temperature is back to normal or as told by your doctor. You may use a face mask and wash your hands to stop your cold from spreading. GET HELP RIGHT AWAY IF:   After the first few days, you feel you are getting worse.  You have questions about your medicine.  You have chills, shortness of breath, or brown or red spit (mucus).  You have yellow or brown snot (nasal discharge) or pain in the face, especially when you bend forward.  You have a fever, puffy (swollen) neck, pain when you swallow, or white spots in the back of your throat.  You have a bad headache, ear pain, sinus pain, or chest pain.  You have a high-pitched whistling sound when you breathe in and out (wheezing).  You have a lasting cough or cough up blood.  You have sore muscles or a stiff neck. MAKE SURE YOU:   Understand these instructions.  Will watch your condition.  Will get help right away if you are not doing well or get worse. Document Released: 01/08/2008 Document Revised: 10/14/2011 Document Reviewed: 11/26/2010 [facility] Patient Information 2013 [place], [place].

## 2012-08-04 ENCOUNTER — Emergency Department
Admission: EM | Admit: 2012-08-04 | Discharge: 2012-08-04 | Disposition: A | Discharge status: Home / Self Care | Payer: PRIVATE HEALTH INSURANCE | Source: Home / Self Care | Attending: Family Medicine | Admitting: Family Medicine

## 2012-08-04 ENCOUNTER — Emergency Department (INDEPENDENT_AMBULATORY_CARE_PROVIDER_SITE_OTHER): Payer: PRIVATE HEALTH INSURANCE

## 2012-08-04 ENCOUNTER — Encounter: Payer: Self-pay | Admitting: Emergency Medicine

## 2012-08-04 DIAGNOSIS — R059 Cough, unspecified: Secondary | ICD-10-CM

## 2012-08-04 DIAGNOSIS — R05 Cough: Secondary | ICD-10-CM

## 2012-08-04 DIAGNOSIS — J189 Pneumonia, unspecified organism: Secondary | ICD-10-CM

## 2012-08-04 DIAGNOSIS — R509 Fever, unspecified: Secondary | ICD-10-CM

## 2012-08-04 DIAGNOSIS — R918 Other nonspecific abnormal finding of lung field: Secondary | ICD-10-CM

## 2012-08-04 LAB — POCT CBC W AUTO DIFF (K'VILLE URGENT CARE)

## 2012-08-04 MED ORDER — HYDROCOD POLST-CHLORPHEN POLST 10-8 MG/5ML PO LQCR: 5.0000 mL | Freq: Every evening | ORAL | Status: DC | PRN

## 2012-08-04 MED ORDER — DOXYCYCLINE HYCLATE 100 MG PO CAPS: 100.0000 mg | ORAL_CAPSULE | Freq: Two times a day (BID) | ORAL | Status: DC

## 2012-08-04 NOTE — ED Notes (Signed)
Cough, fever, dizzy, SOB x 10 days

## 2012-08-04 NOTE — ED Provider Notes (Signed)
History     CSN: [id number]  Arrival date & time 08/04/12  1504   First MD Initiated Contact with Patient 08/04/12 1537      Chief Complaint  Patient presents with  . Cough      HPI Comments: Patient reports that she continues to cough despite course of prednisone and a Z-pack.  Over the past 10 days she has continued to have a low grade fever with chills and sweats.  She continues to wheeze and has shortness of breath with activity.  She is using her albuterol inhaler more frequently.  Her cough is somewhat worse at night.  She often coughs until she gags.  She has had an increase in myalgias past few days.  The history is provided by the patient.    Past Medical History  Diagnosis Date  . GERD (gastroesophageal reflux disease)   . Allergy   . Asthma   . Depression   . Back pain   . Hypertension   . SVT (supraventricular tachycardia)     Past Surgical History  Procedure Date  . Abdominal hysterectomy   . Right arm reconstruction 2002  . Foot surgery   . Appendectomy     Family History  Problem Relation Age of Onset  . Alcohol abuse Mother   . Diabetes Mother   . Hyperlipidemia Mother   . Hypertension Mother   . Alzheimer's disease Father   . Diabetes Sister   . Hyperlipidemia Sister   . Hypertension Sister   . Heart attack Maternal Uncle   . Cancer Maternal Grandmother     lung  . Heart attack Maternal Grandfather   . Heart disease Mother     Rheumatic disease    History  Substance Use Topics  . Smoking status: Never Smoker   . Smokeless tobacco: Not on file  . Alcohol Use: 3.6 oz/week    6 Glasses of wine per week     Comment: 1-2 glasses wine per night    OB History    Grav Para Term Preterm Abortions TAB SAB Ect Mult Living                  Review of Systems + sore throat + cough No pleuritic pain but has anterior chest tightness + wheezing + nasal congestion + post-nasal drainage No sinus pain/pressure No itchy/red eyes ?  earache No hemoptysis + SOB with activity + fever, + chills + nausea + vomiting occasionally No abdominal pain No diarrhea No urinary symptoms No skin rashes + fatigue + myalgias + headache   Allergies  Codeine sulfate  Home Medications   Current Outpatient Rx  Name  Route  Sig  Dispense  Refill  . ALBUTEROL SULFATE HFA 108 (90 BASE) MCG/ACT IN AERS   Inhalation   Inhale 2 puffs into the lungs every 6 (six) hours as needed.         . AZITHROMYCIN 250 MG PO TABS      2 tabs now and then 1 tab for 4 days.   6 each   0   . HYDROCOD POLST-CPM POLST ER 10-8 MG/5ML PO LQCR   Oral   Take 5 mLs by mouth at bedtime as needed.   115 mL   0   . CYCLOBENZAPRINE HCL 10 MG PO TABS   Oral   Take 10 mg by mouth 3 (three) times daily as needed.         . DEXLANSOPRAZOLE 60 MG PO CPDR  Oral   Take 60 mg by mouth daily.         Marland Kitchen DICLOFENAC SODIUM 75 MG PO TBEC   Oral   Take 75 mg by mouth daily.          Marland Kitchen DOXYCYCLINE HYCLATE 100 MG PO CAPS   Oral   Take 1 capsule (100 mg total) by mouth 2 (two) times daily.   20 capsule   0   . HYDROCODONE-HOMATROPINE 5-1.5 MG/5ML PO SYRP   Oral   Take 5 mLs by mouth at bedtime as needed for cough.   120 mL   0   . LOSARTAN POTASSIUM-HCTZ 50-12.5 MG PO TABS   Oral   Take 1 tablet by mouth daily.   30 tablet   5   . METOPROLOL SUCCINATE ER 50 MG PO TB24   Oral   Take 1 tablet (50 mg total) by mouth daily. Take with or immediately following a meal.   90 tablet   3   . MONTELUKAST SODIUM 10 MG PO TABS   Oral   Take 1 tablet (10 mg total) by mouth at bedtime.   30 tablet   11   . PREDNISONE 20 MG PO TABS   Oral   Take 1 tablet (20 mg total) by mouth 2 (two) times daily. For 5 days.   10 tablet   0   . VENLAFAXINE HCL 75 MG PO TABS   Oral   Take 75 mg by mouth daily.            BP 108/78  Pulse 98  Temp 98.6 F (37 C) (Oral)  Resp 18  Ht 5\' 4"  (1.626 m)  Wt 212 lb (96.163 kg)  BMI 36.39 kg/m2   SpO2 97%  Physical Exam Nursing notes and Vital Signs reviewed. Appearance:  Patient appears stated age, and in no acute distress.  Patient is obese (BMI 36.4) Eyes:  Pupils are equal, round, and reactive to light and accomodation.  Extraocular movement is intact.  Conjunctivae are not inflamed  Ears:  Canals normal.  Tympanic membranes normal.  Nose:  Mildly congested turbinates.  No sinus tenderness.   Pharynx:  Normal Neck:  Supple.  Slightly tender shotty posterior nodes are palpated bilaterally  Lungs:  Clear to auscultation.  Breath sounds are equal.  Chest:  Distinct tenderness to palpation over the mid-sternum.  Heart:  Regular rate and rhythm without murmurs, rubs, or gallops.  Abdomen:  Nontender without masses or hepatosplenomegaly.  Bowel sounds are present.  No CVA or flank tenderness.  Extremities:  No edema.  No calf tenderness Skin:  No rash present.   ED Course  Procedures  none   Labs Reviewed  POCT CBC W AUTO DIFF ([facility]) :  WBC 8.3; LY 21.2; MO; 14.0 GR 64.9; Hgb 14.0; Platelets 297    Dg Chest 2 View  08/04/2012  *RADIOLOGY REPORT*  Clinical Data: a cough and fever.  CHEST - 2 VIEW  Comparison: None  Findings: The cardiac silhouette, mediastinal and hilar contours are normal.  There are patchy bibasilar infiltrates.  No pleural effusion and/or pulmonary edema.  IMPRESSION: Bibasilar infiltrates.   Original Report Authenticated By: Rudie Meyer, M.D.      1. Pneumonia; ? Atypical agent.  Note normal white blood count 8.3      MDM  Begin doxycycline for 10 days.  Tussionex at bedtime for cough. Take plain Mucinex (guaifenesin) twice daily for cough and congestion.  Increase fluid  intake, rest. May use Afrin nasal spray (or generic oxymetazoline) twice daily for about 5 days.  Also recommend using saline nasal spray several times daily and saline nasal irrigation (AYR is a common brand) Stop all antihistamines for now, and other non-prescription  cough/cold preparations. Continue albuterol inhaler as needed. Recommend a flu shot when well.  Follow-up with family doctor in one week.  Will need repeat chest X-ray in about 10 to 14 days.        Lattie Haw, MD 08/04/12 [phone]

## 2012-08-06 ENCOUNTER — Telehealth: Payer: Self-pay | Admitting: *Deleted

## 2012-08-06 MED ORDER — FLUCONAZOLE 150 MG PO TABS: 150.0000 mg | ORAL_TABLET | Freq: Once | ORAL | Status: DC

## 2012-08-06 NOTE — Telephone Encounter (Signed)
Sent to pharmacy 

## 2012-08-06 NOTE — Telephone Encounter (Signed)
Pt seen in UC dx with pneumonia and given doxycycline. She now has a yeast infection and wants to know if you would call her in some Diflucan

## 2012-08-07 ENCOUNTER — Telehealth: Payer: Self-pay | Admitting: Physician Assistant

## 2012-08-07 MED ORDER — NYSTATIN 100000 UNIT/ML MT SUSP: 500000.0000 [IU] | Freq: Four times a day (QID) | OROMUCOSAL | Status: DC

## 2012-08-07 NOTE — Telephone Encounter (Signed)
Pt called in with thrush of mouth after abx usage. Will send nystatin.

## 2012-08-07 NOTE — Telephone Encounter (Signed)
Pt.notified

## 2012-08-11 ENCOUNTER — Institutional Professional Consult (permissible substitution): Payer: PRIVATE HEALTH INSURANCE | Admitting: Internal Medicine

## 2012-08-12 ENCOUNTER — Ambulatory Visit: Payer: PRIVATE HEALTH INSURANCE | Admitting: Physician Assistant

## 2012-08-21 ENCOUNTER — Ambulatory Visit (INDEPENDENT_AMBULATORY_CARE_PROVIDER_SITE_OTHER): Payer: PRIVATE HEALTH INSURANCE

## 2012-08-21 ENCOUNTER — Encounter: Payer: Self-pay | Admitting: Physician Assistant

## 2012-08-21 ENCOUNTER — Ambulatory Visit (INDEPENDENT_AMBULATORY_CARE_PROVIDER_SITE_OTHER): Payer: PRIVATE HEALTH INSURANCE | Admitting: Physician Assistant

## 2012-08-21 VITALS — BP 123/78 | HR 87 | Temp 98.2°F | Wt 220.0 lb

## 2012-08-21 DIAGNOSIS — J45909 Unspecified asthma, uncomplicated: Secondary | ICD-10-CM

## 2012-08-21 DIAGNOSIS — Z09 Encounter for follow-up examination after completed treatment for conditions other than malignant neoplasm: Secondary | ICD-10-CM

## 2012-08-21 DIAGNOSIS — J189 Pneumonia, unspecified organism: Secondary | ICD-10-CM

## 2012-08-21 MED ORDER — SODIUM CHLORIDE 0.9 % IV SOLN: 125.0000 mg | Freq: Once | INTRAVENOUS | Status: DC

## 2012-08-21 MED ORDER — METHYLPREDNISOLONE SODIUM SUCC 125 MG IJ SOLR
125.0000 mg | Freq: Once | INTRAMUSCULAR | Status: AC
  Administered 2012-08-21: 125 mg via INTRAMUSCULAR

## 2012-08-21 MED ORDER — IPRATROPIUM-ALBUTEROL 20-100 MCG/ACT IN AERS: 1.0000 | INHALATION_SPRAY | Freq: Four times a day (QID) | RESPIRATORY_TRACT | Status: DC | PRN

## 2012-08-21 NOTE — Patient Instructions (Addendum)
Get Chest x-ray today. Will call with results.  Try combivent 4 times a day.   [facility] cold care. 3 drop on tongues up to three days.   Call me on Monday.

## 2012-08-23 NOTE — Progress Notes (Signed)
  Subjective:    Patient ID: Tracy Landry, female    DOB: June 05, 1961, 52 y.o.   MRN: [id number]  HPI  Patient comes in today to follow up on pneumonia that was diagnosed on 08/04/13. She has not been in sooner because she lost her insurance and in the process of getting another insurance. She still does not feel well. She is having a lot of chest tightness and SOB. She denies fever, chills, muscle aches. She finished her doxycyline and took a steroid for 5 days. She did feel some better but the cough is continual. She coughs so hard that she turns red and feels like she can not breath. The cough is not productive. Cold air, laying down makes worse but anything can start a coughing spell. She on average has about 6-10 a day. She has used hycodan and it does help some.   Review of Systems  Constitutional: Positive for diaphoresis, appetite change and fatigue. Negative for fever and chills.  HENT: Positive for congestion. Negative for ear pain, neck pain, neck stiffness and sinus pressure.   Eyes: Negative.   Respiratory: Positive for cough, chest tightness, shortness of breath and wheezing.   Cardiovascular: Negative.   Gastrointestinal: Negative.   Genitourinary: Negative.   Psychiatric/Behavioral: Negative.        Objective:   Physical Exam  Constitutional: She is oriented to person, place, and time. She appears well-developed and well-nourished.  HENT:  Head: Normocephalic and atraumatic.  Right Ear: External ear normal.  Left Ear: External ear normal.  Nose: Nose normal.  Mouth/Throat: Oropharynx is clear and moist.  Eyes: Conjunctivae normal are normal.  Neck: Normal range of motion. Neck supple.  Cardiovascular: Normal rate, regular rhythm and normal heart sounds.   Pulmonary/Chest:       Decreased effort and air movement. Dry cough made worse with deep breaths. Inspiratory wheezing bilaterally.   Lymphadenopathy:    She has no cervical adenopathy.  Neurological: She is  alert and oriented to person, place, and time.  Skin:       Flushed face.  Psychiatric: She has a normal mood and affect. Her behavior is normal.          Assessment & Plan:  Asthmatic, bronchitis- I gave nebulizer in office today. She did feel some better and was not coughing as much. Would like to send home with nebulizer but patient states she cannot afford today. She does not want any oral or inhaled steroids. She has gotten thrush every times she uses these products. She hates having thrush. She has tried all of the methods to prevent this and washes her mouth out before every use. Will give combivent to try 4 times a day start with 1 puff but can go to 2 puff. Will give shot of Solumedrol 125mg  in office. Reassured pt that I believe symptoms are due to inflammation and not infection. Will get a chest x-ray to confirm resolution of pneumonia.Explain to patient that steroids are the mainstay of treatment for asthma like symptoms. Encouraged to continue using hycodan at night. Mentioned [facility] cold care for cough.

## 2012-08-25 ENCOUNTER — Telehealth: Payer: Self-pay | Admitting: *Deleted

## 2012-08-25 MED ORDER — PROMETHAZINE HCL 25 MG PO TABS: 25.0000 mg | ORAL_TABLET | Freq: Four times a day (QID) | ORAL | Status: DC | PRN

## 2012-08-25 NOTE — Telephone Encounter (Signed)
Pt.notified

## 2012-08-25 NOTE — Telephone Encounter (Signed)
Partner calls & states that pt has possibly caught the stomach bug going around the nursing home where her father is & pt is constantly vomiting.  Can we prescribe antiemetic? Please advise

## 2012-08-25 NOTE — Telephone Encounter (Signed)
Prescription sent to her pharmacy for Phenergan. Call if she feels she's getting worse, if not resolving, or feels that she's getting dehydrated.

## 2012-09-19 ENCOUNTER — Other Ambulatory Visit: Payer: Self-pay

## 2012-09-22 ENCOUNTER — Ambulatory Visit (INDEPENDENT_AMBULATORY_CARE_PROVIDER_SITE_OTHER): Payer: BC Managed Care – PPO | Admitting: Family Medicine

## 2012-09-22 DIAGNOSIS — Z23 Encounter for immunization: Secondary | ICD-10-CM

## 2012-09-28 ENCOUNTER — Institutional Professional Consult (permissible substitution): Payer: PRIVATE HEALTH INSURANCE | Admitting: Internal Medicine

## 2012-09-30 ENCOUNTER — Ambulatory Visit (INDEPENDENT_AMBULATORY_CARE_PROVIDER_SITE_OTHER): Payer: BC Managed Care – PPO | Admitting: Internal Medicine

## 2012-09-30 ENCOUNTER — Encounter: Payer: Self-pay | Admitting: *Deleted

## 2012-09-30 ENCOUNTER — Encounter: Payer: Self-pay | Admitting: Internal Medicine

## 2012-09-30 DIAGNOSIS — I471 Supraventricular tachycardia: Secondary | ICD-10-CM

## 2012-09-30 DIAGNOSIS — I10 Essential (primary) hypertension: Secondary | ICD-10-CM

## 2012-09-30 DIAGNOSIS — I498 Other specified cardiac arrhythmias: Secondary | ICD-10-CM

## 2012-09-30 LAB — BASIC METABOLIC PANEL
BUN: 11 mg/dL (ref 6–23)
GFR: 91.05 mL/min (ref >60.00)
Potassium: 3.9 mEq/L (ref 3.5–5.1)
Sodium: 136 mEq/L (ref 135–145)

## 2012-09-30 LAB — CBC WITH DIFFERENTIAL/PLATELET
Basophils Absolute: 0 10*3/uL (ref 0.0–0.1)
Eosinophils Absolute: 0.1 10*3/uL (ref 0.0–0.7)
Lymphocytes Relative: 39.8 % (ref 12.0–46.0)
MCHC: 34.3 g/dL (ref 30.0–36.0)
MCV: 91.9 fl (ref 78.0–100.0)
Monocytes Absolute: 0.5 10*3/uL (ref 0.1–1.0)
Neutrophils Relative %: 50.2 % (ref 43.0–77.0)
Platelets: 310 10*3/uL (ref 150.0–400.0)
RDW: 13.8 % (ref 11.5–14.6)

## 2012-09-30 LAB — PROTIME-INR
INR: 1 ratio (ref 0.8–1.0)
Prothrombin Time: 10.5 s (ref 10.2–12.4)

## 2012-09-30 NOTE — Assessment & Plan Note (Signed)
On medical therapy, her blood pressure appears to be fairly well-controlled. No change in medications at this time. She is instructed to maintain a low-sodium diet.

## 2012-09-30 NOTE — Progress Notes (Signed)
HPI Tracy Landry is referred today for evaluation of SVT. She is an obese 52-year-old woman with a history of tachycardia palpitations and hypertension. She has had documented episodes of SVT at rates of up to 190 beats per minute for over a year. She has been on medical therapy with beta blockers which have improved her symptoms but despite this, she has persistence of SVT. When the patient goes into SVT, she will fill shortness of breath and chest pressure but has never had frank syncope. The episodes may last up to an hour. At times, Valsalva maneuvers were terminated her SVT. At other times, the maneuvers will not terminated her SVT. Allergies  Allergen Reactions  . Codeine Sulfate     REACTION: itching     Current Outpatient Prescriptions  Medication Sig Dispense Refill  . albuterol (PROVENTIL HFA;VENTOLIN HFA) 108 (90 BASE) MCG/ACT inhaler Inhale 2 puffs into the lungs every 6 (six) hours as needed.      . cyclobenzaprine (FLEXERIL) 10 MG tablet Take 10 mg by mouth 3 (three) times daily as needed.      . dexlansoprazole (DEXILANT) 60 MG capsule Take 60 mg by mouth daily.      . diclofenac (VOLTAREN) 75 MG EC tablet Take 75 mg by mouth daily.       . losartan-hydrochlorothiazide (HYZAAR) 50-12.5 MG per tablet Take 1 tablet by mouth daily.  30 tablet  5  . metoprolol succinate (TOPROL-XL) 50 MG 24 hr tablet Take 1 tablet (50 mg total) by mouth daily. Take with or immediately following a meal.  90 tablet  3  . montelukast (SINGULAIR) 10 MG tablet Take 1 tablet (10 mg total) by mouth at bedtime.  30 tablet  11  . promethazine (PHENERGAN) 25 MG tablet Take 1 tablet (25 mg total) by mouth every 6 (six) hours as needed for nausea.  10 tablet  0  . venlafaxine (EFFEXOR) 75 MG tablet Take 75 mg by mouth daily.       . chlorpheniramine-HYDROcodone (TUSSIONEX PENNKINETIC ER) 10-8 MG/5ML LQCR Take 5 mLs by mouth at bedtime as needed.  115 mL  0   No current facility-administered medications for this  visit.     Past Medical History  Diagnosis Date  . GERD (gastroesophageal reflux disease)   . Allergy   . Asthma   . Depression   . Back pain   . Hypertension   . SVT (supraventricular tachycardia)     ROS:   All systems reviewed and negative except as noted in the HPI.   Past Surgical History  Procedure Laterality Date  . Abdominal hysterectomy    . Right arm reconstruction  2002  . Foot surgery    . Appendectomy       Family History  Problem Relation Age of Onset  . Alcohol abuse Mother   . Diabetes Mother   . Hyperlipidemia Mother   . Hypertension Mother   . Alzheimer's disease Father   . Diabetes Sister   . Hyperlipidemia Sister   . Hypertension Sister   . Heart attack Maternal Uncle   . Cancer Maternal Grandmother     lung  . Heart attack Maternal Grandfather   . Heart disease Mother     Rheumatic disease     History   Social History  . Marital Status: Single    Spouse Name: N/A    Number of Children: N/A  . Years of Education: N/A   Occupational History  .        Landscape   Social History Main Topics  . Smoking status: Never Smoker   . Smokeless tobacco: Not on file  . Alcohol Use: 3.6 oz/week    6 Glasses of wine per week     Comment: 1-2 glasses wine per night  . Drug Use: Not on file  . Sexually Active: Yes   Other Topics Concern  . Not on file   Social History Narrative  . No narrative on file     BP 136/82  Pulse 68  Physical Exam:  Well appearing middle-aged, obese woman,NAD HEENT: Unremarkable Neck:  7 cm JVD, no thyromegally Lungs:  Clear with no wheezes, rales, or rhonchi. HEART:  Regular rate rhythm, no murmurs, no rubs, no clicks Abd:  soft, positive bowel sounds, no organomegally, no rebound, no guarding Ext:  2 plus pulses, no edema, no cyanosis, no clubbing Skin:  No rashes no nodules Neuro:  CN II through XII intact, motor grossly intact  EKG normal sinus rhythm with no ventricular  preexcitation.  Assess/Plan: 

## 2012-09-30 NOTE — Patient Instructions (Addendum)
You will need to have lab work today for your SVT Ablation  Your physician has recommended that you have an SVT ablation on Wednesday October 07, 2012 at 9:30 am. Catheter ablation is a medical procedure used to treat some cardiac arrhythmias (irregular heartbeats). During catheter ablation, a long, thin, flexible tube is put into a blood vessel in your groin (upper thigh), or neck. This tube is called an ablation catheter. It is then guided to your heart through the blood vessel. Radio frequency waves destroy small areas of heart tissue where abnormal heartbeats may cause an arrhythmia to start. Please see the instruction sheet given to you today.

## 2012-09-30 NOTE — Assessment & Plan Note (Signed)
The patient has a short RP, narrow complex tachycardia, at 190 beats per minute. I've discussed the treatment options with the patient in detail. The risk, goals, benefits, and expectations of the procedure have been discussed with the patient and she wishes to proceed with catheter ablation.

## 2012-10-01 ENCOUNTER — Encounter (HOSPITAL_COMMUNITY): Payer: Self-pay | Admitting: Pharmacy Technician

## 2012-10-01 ENCOUNTER — Other Ambulatory Visit: Payer: Self-pay | Admitting: *Deleted

## 2012-10-07 ENCOUNTER — Ambulatory Visit (HOSPITAL_COMMUNITY)
Admission: RE | Admit: 2012-10-07 | Discharge: 2012-10-07 | Disposition: A | Discharge status: Home / Self Care | Payer: BC Managed Care – PPO | Source: Ambulatory Visit | Attending: Internal Medicine | Admitting: Internal Medicine

## 2012-10-07 ENCOUNTER — Encounter (HOSPITAL_COMMUNITY): Payer: Self-pay | Admitting: Physician Assistant

## 2012-10-07 ENCOUNTER — Encounter (HOSPITAL_COMMUNITY)
Admission: RE | Disposition: A | Discharge status: Home / Self Care | Payer: Self-pay | Source: Ambulatory Visit | Attending: Internal Medicine

## 2012-10-07 DIAGNOSIS — I509 Heart failure, unspecified: Secondary | ICD-10-CM | POA: Insufficient documentation

## 2012-10-07 DIAGNOSIS — I10 Essential (primary) hypertension: Secondary | ICD-10-CM | POA: Insufficient documentation

## 2012-10-07 DIAGNOSIS — I498 Other specified cardiac arrhythmias: Secondary | ICD-10-CM | POA: Insufficient documentation

## 2012-10-07 DIAGNOSIS — F329 Major depressive disorder, single episode, unspecified: Secondary | ICD-10-CM | POA: Insufficient documentation

## 2012-10-07 DIAGNOSIS — K219 Gastro-esophageal reflux disease without esophagitis: Secondary | ICD-10-CM | POA: Insufficient documentation

## 2012-10-07 DIAGNOSIS — I471 Supraventricular tachycardia: Secondary | ICD-10-CM

## 2012-10-07 DIAGNOSIS — F3289 Other specified depressive episodes: Secondary | ICD-10-CM | POA: Insufficient documentation

## 2012-10-07 DIAGNOSIS — M549 Dorsalgia, unspecified: Secondary | ICD-10-CM | POA: Insufficient documentation

## 2012-10-07 DIAGNOSIS — J45909 Unspecified asthma, uncomplicated: Secondary | ICD-10-CM | POA: Insufficient documentation

## 2012-10-07 HISTORY — PX: SUPRAVENTRICULAR TACHYCARDIA ABLATION: SHX5492

## 2012-10-07 HISTORY — PX: ABLATION OF DYSRHYTHMIC FOCUS: SHX254

## 2012-10-07 SURGERY — SUPRAVENTRICULAR TACHYCARDIA ABLATION
Anesthesia: Moderate Sedation

## 2012-10-07 MED ORDER — ALBUTEROL SULFATE HFA 108 (90 BASE) MCG/ACT IN AERS: 2.0000 | INHALATION_SPRAY | Freq: Four times a day (QID) | RESPIRATORY_TRACT | Status: DC | PRN

## 2012-10-07 MED ORDER — MIDAZOLAM HCL 5 MG/5ML IJ SOLN
INTRAMUSCULAR | Status: AC
  Filled 2012-10-07: qty 5

## 2012-10-07 MED ORDER — CYCLOBENZAPRINE HCL 10 MG PO TABS: 10.0000 mg | ORAL_TABLET | Freq: Every day | ORAL | Status: DC

## 2012-10-07 MED ORDER — MONTELUKAST SODIUM 10 MG PO TABS
10.0000 mg | ORAL_TABLET | Freq: Every day | ORAL | Status: DC
  Filled 2012-10-07: qty 1

## 2012-10-07 MED ORDER — METOPROLOL SUCCINATE ER 25 MG PO TB24: 25.0000 mg | ORAL_TABLET | Freq: Every evening | ORAL | Status: DC

## 2012-10-07 MED ORDER — SODIUM CHLORIDE 0.9 % IV SOLN: 250.0000 mL | INTRAVENOUS | Status: DC | PRN

## 2012-10-07 MED ORDER — ACETAMINOPHEN 325 MG PO TABS: 650.0000 mg | ORAL_TABLET | ORAL | Status: DC | PRN

## 2012-10-07 MED ORDER — ONDANSETRON HCL 4 MG/2ML IJ SOLN: 4.0000 mg | Freq: Four times a day (QID) | INTRAMUSCULAR | Status: DC | PRN

## 2012-10-07 MED ORDER — SODIUM CHLORIDE 0.9 % IJ SOLN: 3.0000 mL | INTRAMUSCULAR | Status: DC | PRN

## 2012-10-07 MED ORDER — SODIUM CHLORIDE 0.9 % IJ SOLN: 3.0000 mL | Freq: Two times a day (BID) | INTRAMUSCULAR | Status: DC

## 2012-10-07 MED ORDER — FLUMAZENIL 1 MG/10ML IV SOLN
INTRAVENOUS | Status: AC
  Filled 2012-10-07: qty 10

## 2012-10-07 MED ORDER — METOPROLOL SUCCINATE ER 50 MG PO TB24
50.0000 mg | ORAL_TABLET | Freq: Every day | ORAL | Status: DC
  Filled 2012-10-07: qty 1

## 2012-10-07 MED ORDER — FENTANYL CITRATE 0.05 MG/ML IJ SOLN
INTRAMUSCULAR | Status: AC
  Filled 2012-10-07: qty 2

## 2012-10-07 MED ORDER — FENTANYL CITRATE 0.05 MG/ML IJ SOLN
INTRAMUSCULAR | Status: AC
Start: 2012-10-07 — End: 2012-10-07
  Filled 2012-10-07: qty 2

## 2012-10-07 MED ORDER — BUPIVACAINE HCL (PF) 0.25 % IJ SOLN
INTRAMUSCULAR | Status: AC
  Filled 2012-10-07: qty 60

## 2012-10-07 NOTE — H&P (View-Only) (Signed)
HPI Tracy Landry is referred today for evaluation of SVT. She is an obese 52 year old woman with a history of tachycardia palpitations and hypertension. She has had documented episodes of SVT at rates of up to 190 beats per minute for over a year. She has been on medical therapy with beta blockers which have improved her symptoms but despite this, she has persistence of SVT. When the patient goes into SVT, she will fill shortness of breath and chest pressure but has never had frank syncope. The episodes may last up to an hour. At times, Valsalva maneuvers were terminated her SVT. At other times, the maneuvers will not terminated her SVT. Allergies  Allergen Reactions  . Codeine Sulfate     REACTION: itching     Current Outpatient Prescriptions  Medication Sig Dispense Refill  . albuterol (PROVENTIL HFA;VENTOLIN HFA) 108 (90 BASE) MCG/ACT inhaler Inhale 2 puffs into the lungs every 6 (six) hours as needed.      . cyclobenzaprine (FLEXERIL) 10 MG tablet Take 10 mg by mouth 3 (three) times daily as needed.      Marland Kitchen dexlansoprazole (DEXILANT) 60 MG capsule Take 60 mg by mouth daily.      . diclofenac (VOLTAREN) 75 MG EC tablet Take 75 mg by mouth daily.       Marland Kitchen losartan-hydrochlorothiazide (HYZAAR) 50-12.5 MG per tablet Take 1 tablet by mouth daily.  30 tablet  5  . metoprolol succinate (TOPROL-XL) 50 MG 24 hr tablet Take 1 tablet (50 mg total) by mouth daily. Take with or immediately following a meal.  90 tablet  3  . montelukast (SINGULAIR) 10 MG tablet Take 1 tablet (10 mg total) by mouth at bedtime.  30 tablet  11  . promethazine (PHENERGAN) 25 MG tablet Take 1 tablet (25 mg total) by mouth every 6 (six) hours as needed for nausea.  10 tablet  0  . venlafaxine (EFFEXOR) 75 MG tablet Take 75 mg by mouth daily.       . chlorpheniramine-HYDROcodone (TUSSIONEX PENNKINETIC ER) 10-8 MG/5ML LQCR Take 5 mLs by mouth at bedtime as needed.  115 mL  0   No current facility-administered medications for this  visit.     Past Medical History  Diagnosis Date  . GERD (gastroesophageal reflux disease)   . Allergy   . Asthma   . Depression   . Back pain   . Hypertension   . SVT (supraventricular tachycardia)     ROS:   All systems reviewed and negative except as noted in the HPI.   Past Surgical History  Procedure Laterality Date  . Abdominal hysterectomy    . Right arm reconstruction  2002  . Foot surgery    . Appendectomy       Family History  Problem Relation Age of Onset  . Alcohol abuse Mother   . Diabetes Mother   . Hyperlipidemia Mother   . Hypertension Mother   . Alzheimer's disease Father   . Diabetes Sister   . Hyperlipidemia Sister   . Hypertension Sister   . Heart attack Maternal Uncle   . Cancer Maternal Grandmother     lung  . Heart attack Maternal Grandfather   . Heart disease Mother     Rheumatic disease     History   Social History  . Marital Status: Single    Spouse Name: N/A    Number of Children: N/A  . Years of Education: N/A   Occupational History  .  Landscape   Social History Main Topics  . Smoking status: Never Smoker   . Smokeless tobacco: Not on file  . Alcohol Use: 3.6 oz/week    6 Glasses of wine per week     Comment: 1-2 glasses wine per night  . Drug Use: Not on file  . Sexually Active: Yes   Other Topics Concern  . Not on file   Social History Narrative  . No narrative on file     BP 136/82  Pulse 68  Physical Exam:  Well appearing middle-aged, obese woman,NAD HEENT: Unremarkable Neck:  7 cm JVD, no thyromegally Lungs:  Clear with no wheezes, rales, or rhonchi. HEART:  Regular rate rhythm, no murmurs, no rubs, no clicks Abd:  soft, positive bowel sounds, no organomegally, no rebound, no guarding Ext:  2 plus pulses, no edema, no cyanosis, no clubbing Skin:  No rashes no nodules Neuro:  CN II through XII intact, motor grossly intact  EKG normal sinus rhythm with no ventricular  preexcitation.  Assess/Plan:

## 2012-10-07 NOTE — Discharge Summary (Signed)
Discharge Summary   Patient ID: Tracy Landry MRN: [id number], DOB/AGE: August 05, 1961 52 y.o. 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 GERD 2. Allergy 3. Asthma 4. Depression 5. Back pain 6. HTN  Hospital Course:  Tracy Landry is a 52 y/o obese F with history of HTN and asthma who was referred to the office 09/30/12 for SVT. She has had documented episodes of SVT at rates of up to 190 beats per minute for over a year. She has been on medical therapy with beta blockers which have improved her symptoms but despite this, she has persistence of SVT. When the patient goes into SVT, she will feel shortness of breath and chest pressure but has never had frank syncope. The episodes may last up to an hour. At times, Valsalva maneuvers were terminated her SVT but at other times this was unsuccessful. Dr. Ladona Ridgel discussed the options with the patient, and ultimately she elected to proceed with catheter ablation. She was admitted today for this procedure and underwent successful EPS/RFA of AVNRT. Dr. Ladona Ridgel has seen and examined her today and feels she is stable for discharge. He would like her to continue half dose metoprolol until seen in followup.    Discharge Vitals: Blood pressure 130/79, pulse 73, temperature 98 F (36.7 C), temperature source Oral, resp. rate 18, height 5\' 4"  (1.626 m), weight 220 lb (99.791 kg), SpO2 96.00%.  Labs: Lab Results  Component Value Date   WBC 6.1 09/30/2012   HGB 13.2 09/30/2012   HCT 38.6 09/30/2012   MCV 91.9 09/30/2012   PLT 310.0 09/30/2012     Diagnostic Studies/Procedures   1. EPS/RFA, please see op report  Discharge Medications     Medication List    TAKE these medications       albuterol 108 (90 BASE) MCG/ACT inhaler  Commonly known as:  PROVENTIL HFA;VENTOLIN HFA  Inhale 2 puffs into the lungs every  6 (six) hours as needed. For wheezing     cyclobenzaprine 10 MG tablet  Commonly known as:  FLEXERIL  Take 10 mg by mouth at bedtime.     dexlansoprazole 60 MG capsule  Commonly known as:  DEXILANT  Take 60 mg by mouth every morning.     diclofenac 75 MG EC tablet  Commonly known as:  VOLTAREN  Take 75 mg by mouth daily.     losartan-hydrochlorothiazide 50-12.5 MG per tablet  Commonly known as:  HYZAAR  Take 1 tablet by mouth every morning.     metoprolol succinate 25 MG 24 hr tablet  Commonly known as:  TOPROL-XL  Take 1 tablet (25 mg total) by mouth every evening.     montelukast 10 MG tablet  Commonly known as:  SINGULAIR  Take 1 tablet (10 mg total) by mouth at bedtime.     venlafaxine 75 MG tablet  Commonly known as:  EFFEXOR  Take 75 mg by mouth daily.        Disposition   The patient will be discharged in stable condition to home.     Discharge Orders   Future Appointments Provider Department Dept Phone   11/05/2012 2:00 PM Marinus Maw, MD [facility] Main Office [place]) [phone]   Future Orders Complete By Expires     Diet - low sodium heart healthy  As directed  Increase activity slowly  As directed     Comments:      No driving for 2 days. No lifting over 5 lbs for 1 week. No sexual activity for 1 week. You may return to work in 1 week. Keep procedure site clean & dry. If you notice increased pain, swelling, bleeding or pus, call/return!  You may shower, but no soaking baths/hot tubs/pools for 1 week.          Duration of Discharge Encounter: Greater than 30 minutes including physician and PA time.  Signed, Ronie Spies PA-C 10/07/2012, 3:49 PM  Cardiology Attending  Agree with above.  Leonia Reeves.D.

## 2012-10-07 NOTE — Op Note (Signed)
EPS/RFA of AVNRT without immediate complication. [id number].

## 2012-10-07 NOTE — Progress Notes (Signed)
Patient arrived from cath lab.  Alert and oriented x4.  Vitals stable.  Right jugular site and right femoral site both level 0.  Denies pain.  Will continue to monitor.

## 2012-10-07 NOTE — Interval H&P Note (Signed)
History and Physical Interval Note:  10/07/2012 11:30 AM  Tracy Landry  has presented today for surgery, with the diagnosis of SVT  The various methods of treatment have been discussed with the patient and family. After consideration of risks, benefits and other options for treatment, the patient has consented to  Procedure(s): SUPRAVENTRICULAR TACHYCARDIA ABLATION (N/A) as a surgical intervention .  The patient's history has been reviewed, patient examined, no change in status, stable for surgery.  I have reviewed the patient's chart and labs.  Questions were answered to the patient's satisfaction.     Leonia Reeves.D.

## 2012-10-08 ENCOUNTER — Encounter (HOSPITAL_COMMUNITY): Payer: Self-pay | Admitting: *Deleted

## 2012-10-08 NOTE — Op Note (Signed)
NAMEDESHAWNDA, ACREY              ACCOUNT NO.:  [id number]  MEDICAL RECORD NO.:  [id number]  LOCATION:  6524                         FACILITY:  [facility]  PHYSICIAN:  Doylene Canning. Ladona Ridgel, MD    DATE OF BIRTH:  Jul 26, 1961  DATE OF PROCEDURE:  10/07/2012 DATE OF DISCHARGE:  10/07/2012                              OPERATIVE REPORT   PROCEDURE PERFORMED:  Electrophysiologic study and radiofrequency catheter ablation of atrioventricular node reentrant tachycardia.  INTRODUCTION:  The patient is a 52 year old woman with a history of tachy palpitations for many years.  She has had documented SVT at rates of almost 200 beats per minute.  She has been on medical therapy with beta-blockers, but despite this, she has had recurrent SVT and now presents for catheter ablation.  PROCEDURE:  After informed consent was obtained, the patient was taken to the diagnostic EP lab in a fasting state.  After usual preparation and draping, intravenous fentanyl and midazolam were given for sedation. A 6-French hexapolar catheter was inserted percutaneously into the right jugular vein and advanced to the coronary sinus.  A 6-French quadripolar catheter was inserted percutaneously in the right femoral vein and advanced to right ventricle.  A 6-French quadripolar catheter was inserted percutaneously in the right femoral vein and advanced to His bundle region.  After measuring the basic intervals, rapid ventricular pacing was carried out from the right ventricle and stepwise decreased down to 300 milliseconds where VA Wenckebach was observed.  During rapid ventricular pacing, the atrial activation sequence was midline decremental.  Next, programmed ventricular stimulation was carried out from the right ventricle at base drive cycle length of 161 milliseconds. The S1-S2 interval stepwise decreased from 440 milliseconds down to 230 milliseconds where the retrograde AV node ERP was observed.  During programmed  ventricular stimulation, the atrial activation sequence remained midline and decremental.  Next, programmed atrial stimulation was carried out from the atrium at a base drive cycle length of 096 milliseconds.  The S1-S2 interval stepwise decreased down to 220 milliseconds where atrial refractoriness was observed.  During programmed atrial stimulation, there were multiple AH jumps and multiple echo beats, but no inducible SVT.  Next, rapid atrial pacing was carried out from the atrium at a base drive cycle length of 045 milliseconds and stepwise decreased down to 350 milliseconds resulting in the initiation of SVT.  During SVT, the VA interval was essentially 0 and PVCs placed at the time of His bundle refractoriness did not pre-excite the atrium. In addition, during ventricular pacing during SVT, there was a VAV activation sequence.  All of the above confirmed a diagnosis of AV node reentrant tachycardia.  A 7-French quadripolar ablation catheter was then inserted into the right femoral vein and advanced into the right atrium.  Mapping of Koch's triangle was carried out.  There was much smaller than usual.  From the bottom of Koch's triangle to the AV node was approximately 3 catheter width.  A single RF energy application was delivered to sites 6 and 7 in Koch's triangle resulted in accelerated junctional rhythm.  During RF energy application, there was transient VA block during junctional rhythm, and RF was immediately discontinued.  At this point,  additional rapid atrial pacing was carried out demonstrating no inducible SVT.  The patient was observed for 30 minutes and had additional rapid atrial pacing with no inducible SVT.  There was a residual echo beat noted.  With all of the above, the catheters were removed, hemostasis was assured, the patient was returned to her room in satisfactory condition.  COMPLICATIONS:  There were no immediate procedure complications.  RESULTS:  A.   Baseline ECG.  Baseline ECG demonstrates normal sinus rhythm with normal axis and intervals.  There was no pre-excitation. B.  Baseline intervals.  Sinus node cycle length was 762 milliseconds. The HV interval was 36 milliseconds.  The QRS duration was 100 milliseconds. C.  Rapid ventricular pacing.  Rapid ventricular pacing was carried out from the right ventricle demonstrated VA Wenckebach cycle length of 300 milliseconds.  During rapid ventricular pacing, the atrial activation sequence was midline and decremental. D.  Programmed ventricular stimulation.  Programmed ventricular stimulation was carried out from the right ventricle at base drive cycle length of 981 milliseconds.  The S1-S2 interval stepwise decreased down to 230 milliseconds where ventricular refractoriness was observed. During programmed ventricular stimulation, there were no inducible SVT. The atrial activation sequence was midline decremental. E.  Rapid atrial pacing.  Rapid atrial pacing was carried out from the atrium at base drive cycle length of 191 milliseconds and stepwise decreased down to 340 milliseconds where SVT was induced.  During rapid atrial pacing, the PR interval was greater than the RR interval, and there was inducible SVT. F.  Programmed atrial stimulation.  Programmed atrial stimulation was carried out from the atrium at base drive cycle length of 478 milliseconds.  The S1 and S2 interval was stepwise decreased from 440 milliseconds down to 220 milliseconds where atrial refractoriness was observed.  During programmed atrial stimulation, there were multiple AH jumps and echo beats.  Following ablation, there was residual AH jumps. G.  Arrhythmias observed. 1. AV node reentrant tachycardia initiation with rapid atrial pacing.     The duration was sustained.  Termination was with ventricular     pacing.     a.     Mapping.  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     b.     RF energy application.  A single RF energy application was      delivered to sites 7 in Koch's triangle resulted in accelerated      junctional rhythm.  Following RF energy application, there was no      inducible SVT.  CONCLUSION:  The study demonstrates successful electrophysiologic study and radiofrequency catheter ablation of atrioventricular node reentrant tachycardia with a total of single radiofrequency energy application delivered to sites 7 in Koch's triangle.  Following radiofrequency energy application, there was no inducible supraventricular tachycardia.     Doylene Canning. Ladona Ridgel, MD     GWT/MEDQ  D:  10/07/2012  T:  10/08/2012  Job:  [id number]

## 2012-11-05 ENCOUNTER — Encounter: Payer: Self-pay | Admitting: Internal Medicine

## 2012-11-05 ENCOUNTER — Ambulatory Visit (INDEPENDENT_AMBULATORY_CARE_PROVIDER_SITE_OTHER): Payer: BC Managed Care – PPO | Admitting: Internal Medicine

## 2012-11-05 VITALS — BP 138/81 | HR 101 | Ht 64.3 in | Wt 219.6 lb

## 2012-11-05 DIAGNOSIS — I471 Supraventricular tachycardia: Secondary | ICD-10-CM

## 2012-11-05 DIAGNOSIS — I498 Other specified cardiac arrhythmias: Secondary | ICD-10-CM

## 2012-11-05 DIAGNOSIS — I10 Essential (primary) hypertension: Secondary | ICD-10-CM

## 2012-11-05 NOTE — Progress Notes (Signed)
HPI Tracy Landry returns today for followup. She is a very pleasant 52 year old woman with a history of SVT who underwent catheter ablation of AV node reentrant tachycardia several weeks ago. 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 Fortunately her AV conduction remains stable. She has had no recurrent SVT. She denies chest pain or shortness of breath. Allergies  Allergen Reactions  . Codeine Sulfate Itching     Current Outpatient Prescriptions  Medication Sig Dispense Refill  . albuterol (PROVENTIL HFA;VENTOLIN HFA) 108 (90 BASE) MCG/ACT inhaler Inhale 2 puffs into the lungs every 6 (six) hours as needed. For wheezing      . cyclobenzaprine (FLEXERIL) 10 MG tablet Take 10 mg by mouth at bedtime.       Marland Kitchen dexlansoprazole (DEXILANT) 60 MG capsule Take 60 mg by mouth every morning.       . diclofenac (VOLTAREN) 75 MG EC tablet Take 75 mg by mouth daily.       Marland Kitchen losartan-hydrochlorothiazide (HYZAAR) 50-12.5 MG per tablet Take 1 tablet by mouth every morning.      . metoprolol succinate (TOPROL-XL) 25 MG 24 hr tablet Take 1 tablet (25 mg total) by mouth every evening.  30 tablet  3  . montelukast (SINGULAIR) 10 MG tablet Take 1 tablet (10 mg total) by mouth at bedtime.  30 tablet  11  . venlafaxine (EFFEXOR) 75 MG tablet Take 75 mg by mouth daily.        No current facility-administered medications for this visit.     Past Medical History  Diagnosis Date  . GERD (gastroesophageal reflux disease)   . Allergy   . Asthma   . Depression   . Back pain   . Hypertension   . 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    ROS:   All systems reviewed and negative except as noted in the HPI.   Past Surgical History  Procedure Laterality Date  . Abdominal hysterectomy    . Right arm reconstruction  2002  . Foot surgery    . Appendectomy    . Ablation of  dysrhythmic focus  10/07/2012    SVT     Family History  Problem Relation Age of Onset  . Alcohol abuse Mother   . Diabetes Mother   . Hyperlipidemia Mother   . Hypertension Mother   . Alzheimer's disease Father   . Diabetes Sister   . Hyperlipidemia Sister   . Hypertension Sister   . Heart attack Maternal Uncle   . Cancer Maternal Grandmother     lung  . Heart attack Maternal Grandfather   . Heart disease Mother     Rheumatic disease     History   Social History  . Marital Status: Single    Spouse Name: N/A    Number of Children: N/A  . 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 Smoking status: Never Smoker   . Smokeless tobacco: Never Used  . Alcohol Use: 3.6 oz/week    6 Glasses of wine per week     Comment: 1-2 glasses wine per night  . Drug Use: No  . Sexually Active: Yes   Other Topics Concern  . Not on file   Social History Narrative  . No narrative on file  BP 138/81  Pulse 101  Ht 5' 4.3" (1.633 m)  Wt 219 lb 9.6 oz (99.61 kg)  BMI 37.35 kg/m2  Physical Exam:  Well appearing 52 year old woman,NAD HEENT: Unremarkable Neck:  6 cm JVD, no thyromegally Lungs:  Clear with no wheezes, rales, or rhonchi. HEART:  Regular rate rhythm, no murmurs, no rubs, no clicks Abd:  soft, positive bowel sounds, no organomegally, no rebound, no guarding Ext:  2 plus pulses, no edema, no cyanosis, no clubbing Skin:  No rashes no nodules Neuro:  CN II through XII intact, motor grossly intact  EKG - normal sinus rhythm with normal axis and intervals.   Assess/Plan:

## 2012-11-05 NOTE — Assessment & Plan Note (Signed)
She is doing well status post catheter ablation. I've asked the patient to wean her Toprol off by taking a half tablet daily for a week and then discontinuing Toprol altogether. I'll see her back on an as-needed basis.

## 2012-11-05 NOTE — Patient Instructions (Signed)
Your physician recommends that you schedule a follow-up appointment as needed  

## 2012-11-05 NOTE — Assessment & Plan Note (Signed)
Her blood pressure is minimally elevated today. I've encouraged a low-sodium diet. I've asked her to followup with her primary Dr. If her blood pressure remains elevated.

## 2012-11-21 ENCOUNTER — Other Ambulatory Visit: Payer: Self-pay | Admitting: Physician Assistant

## 2012-12-07 ENCOUNTER — Encounter: Payer: Self-pay | Admitting: Internal Medicine

## 2012-12-14 ENCOUNTER — Ambulatory Visit (INDEPENDENT_AMBULATORY_CARE_PROVIDER_SITE_OTHER): Payer: BC Managed Care – PPO | Admitting: Physician Assistant

## 2012-12-14 ENCOUNTER — Encounter: Payer: Self-pay | Admitting: Physician Assistant

## 2012-12-14 VITALS — BP 145/79 | HR 96 | Wt 205.0 lb

## 2012-12-14 DIAGNOSIS — L723 Sebaceous cyst: Secondary | ICD-10-CM

## 2012-12-14 NOTE — Patient Instructions (Addendum)
Keep covered 24-48 hours. Follow up in 7-14 days.

## 2012-12-14 NOTE — Progress Notes (Signed)
  Subjective:    Patient ID: Tracy Landry, female    DOB: August 30, 1960, 52 y.o.   MRN: [id number]  HPI Patient presents to the clinic with a bump on her back that her partner squeezed and got some pus to come out of. Denies any fever, chills. Bump feels like getting bigger and wants removed.   Review of Systems     Objective:   Physical Exam  Constitutional: She appears well-developed and well-nourished.  Skin:  3mm by 4mm area of raised induration with a pinpoint pustule head. No erythema. No heat. Right upper back.   Psychiatric: She has a normal mood and affect. Her behavior is normal.          Assessment & Plan:  Sebaceous cyst- I did not think pt needed abx at this time. Bactroban applied to incision. Pt warned of signs of infection. If any occurs can send antibiotic.  Sebaceous Cyst Excision Procedure Note  Pre-operative Diagnosis: Epidermal cyst  Post-operative Diagnosis: same  Locations:right upper back  Indications: draining  Anesthesia: Lidocaine 1% without epinephrine without added sodium bicarbonate  Procedure Details  History of allergy to iodine: no  Patient informed of the risks (including bleeding and infection) and benefits of the  procedure and Verbal informed consent obtained.  The lesion and surrounding area was given a sterile prep using betadyne and draped in the usual sterile fashion. An incision was made over the cyst, which was dissected free of the surrounding tissue and removed.  The cyst was filled with typical sebaceous material.  The wound was closed with 5-0 Prolene using simple interrupted stitches. Antibiotic ointment and a sterile dressing applied.  The specimen was not sent for pathologic examination. The patient tolerated the procedure well.  EBL: scant amt  Findings: Purulent drainage and sac was removed.   Condition: Stable  Complications: none.  Plan: 1. Instructed to keep the wound dry and covered for 24-48h and clean  thereafter. 2. Warning signs of infection were reviewed.   3. Recommended that the patient use OTC acetaminophen as needed for pain.  4. Return for suture removal in 7-10 days.

## 2012-12-17 ENCOUNTER — Telehealth: Payer: Self-pay | Admitting: *Deleted

## 2012-12-17 NOTE — Telephone Encounter (Signed)
Pt calls & states that around her incision site is "itching like a bandit" & has bumps around it.  She states that she has been taking benadryl to try to help.  Could this be a reaction to the sutures? Would you like her to come in today? Please advise

## 2012-12-17 NOTE — Telephone Encounter (Signed)
Pt notified.  She stated that she can't take 50mg  of benadryl & function.  I advised her to try all the other suggestions & if it doesn't get any better or if it gets worse to call & come in.

## 2012-12-17 NOTE — Telephone Encounter (Signed)
She can take benadryl 50mg  at a time. Try using bactroban to keep area moist. Make sure not put anything else on incision or anything coming in contact with it. Keep covered when wearing shirt. Shirt could be irritating it. If continues need to have stitches removed since that is the only thing it could be. No drainage?

## 2012-12-17 NOTE — Telephone Encounter (Signed)
Yes i will see her last pt of day if she wants me to look at before I go on vacation.

## 2012-12-21 ENCOUNTER — Encounter: Payer: Self-pay | Admitting: Family Medicine

## 2012-12-21 ENCOUNTER — Ambulatory Visit (INDEPENDENT_AMBULATORY_CARE_PROVIDER_SITE_OTHER): Payer: BC Managed Care – PPO | Admitting: Family Medicine

## 2012-12-21 VITALS — BP 132/91 | HR 99 | Wt 217.0 lb

## 2012-12-21 DIAGNOSIS — L259 Unspecified contact dermatitis, unspecified cause: Secondary | ICD-10-CM

## 2012-12-21 DIAGNOSIS — Z4802 Encounter for removal of sutures: Secondary | ICD-10-CM

## 2012-12-21 MED ORDER — TRIAMCINOLONE ACETONIDE 0.1 % EX OINT: TOPICAL_OINTMENT | Freq: Two times a day (BID) | CUTANEOUS | Status: DC

## 2012-12-21 NOTE — Progress Notes (Signed)
CC: Tracy Landry is a 52 y.o. female is here for Suture / Staple Removal   Subjective: HPI:  Patient is here for removal of sutures. She complains of intense itching starting around the time the sutures were placed last week. Symptoms have not got better or worse since onset. Slightly improved with Benadryl, improves with scratching, nothing else makes better or worse. She's never had this before symptoms are present all hours of the day. She denies drainage, discharge, pain at the suture site. Denies fevers, chills, rashes, nausea.    Review Of Systems Outlined In HPI  Past Medical History  Diagnosis Date  . GERD (gastroesophageal reflux disease)   . Allergy   . Asthma   . Depression   . Back pain   . Hypertension   . 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     Family History  Problem Relation Age of Onset  . Alcohol abuse Mother   . Diabetes Mother   . Hyperlipidemia Mother   . Hypertension Mother   . Alzheimer's disease Father   . Diabetes Sister   . Hyperlipidemia Sister   . Hypertension Sister   . Heart attack Maternal Uncle   . Cancer Maternal Grandmother     lung  . Heart attack Maternal Grandfather   . Heart disease Mother     Rheumatic disease     History  Substance Use Topics  . Smoking status: Never Smoker   . Smokeless tobacco: Never Used  . Alcohol Use: 3.6 oz/week    6 Glasses of wine per week     Comment: 1-2 glasses wine per night     Objective: Filed Vitals:   12/21/12 1508  BP: 132/91  Pulse: 99    Vital signs reviewed. General: Alert and Oriented, No Acute Distress HEENT: Pupils equal, round, reactive to light. Conjunctivae clear.  External ears unremarkable.  Moist mucous membranes. Lungs: Clear and comfortable work of breathing, speaking in full sentences without accessory muscle use. Cardiac: Regular rate and rhythm.  Neuro: CN II-XII grossly intact, gait normal. Extremities: No peripheral edema.   Strong peripheral pulses.  Mental Status: No depression, anxiety, nor agitation. Logical though process. Skin: Warm and dry. On the right upper back there is a clean dry and intact with good approximating surgical site. There are only 2 of 3 sutures remaining. Small vesicular rash with mild erythema in the shape of a Band-Aid overlying the surgical site  Assessment & Plan: Tracy Landry was seen today for suture / staple removal.  Diagnoses and associated orders for this visit:  Contact dermatitis - triamcinolone ointment (KENALOG) 0.1 %; Apply topically 2 (two) times daily. Avoid directly in wound.  Visit for suture removal    2 sutures were removed without difficulty, discussed with patient contact dermatitis likely due to Band-Aid adhesive, will treat with triamcinolone but avoid direct contact to already well healing excision site  Return if symptoms worsen or fail to improve.

## 2012-12-29 ENCOUNTER — Ambulatory Visit: Payer: BC Managed Care – PPO

## 2012-12-30 ENCOUNTER — Ambulatory Visit (INDEPENDENT_AMBULATORY_CARE_PROVIDER_SITE_OTHER): Payer: BC Managed Care – PPO | Admitting: Physician Assistant

## 2012-12-30 DIAGNOSIS — Z23 Encounter for immunization: Secondary | ICD-10-CM

## 2012-12-30 NOTE — Progress Notes (Signed)
  Subjective:    Patient ID: Tracy Landry, female    DOB: 04-16-1961, 52 y.o.   MRN: [id number] Pt given shingles vaccine today.  No complications. HPI    Review of Systems     Objective:   Physical Exam        Assessment & Plan:

## 2013-01-01 ENCOUNTER — Telehealth: Payer: Self-pay

## 2013-01-01 NOTE — Telephone Encounter (Signed)
LMOM for pt to return my call.

## 2013-01-01 NOTE — Telephone Encounter (Signed)
Pt notified of instructions

## 2013-01-01 NOTE — Telephone Encounter (Signed)
She has a reaction to the Shingles injection. Site is red, hot and swollen. What should she look out for?

## 2013-01-01 NOTE — Telephone Encounter (Signed)
Nothing should developed from that. Ice area. Benadryl tonight before bed.If you were to get SOB or feeling like throat closing up. Take benadryl ASAP and get to ER. If it were to continue to stay hot and have drainage could call might be infected but very unlikely. Could run a low grade fever since body is trying to reject meds should go away in next 24 hours.

## 2013-01-29 ENCOUNTER — Encounter: Payer: Self-pay | Admitting: *Deleted

## 2013-02-04 ENCOUNTER — Ambulatory Visit: Payer: Self-pay | Admitting: Obstetrics & Gynecology

## 2013-02-06 ENCOUNTER — Other Ambulatory Visit: Payer: Self-pay | Admitting: Obstetrics and Gynecology

## 2013-02-08 NOTE — Telephone Encounter (Signed)
AEX 03/03/13 #30/0RFS sent through to last pt until next AEX.

## 2013-03-03 ENCOUNTER — Encounter: Payer: Self-pay | Admitting: Obstetrics & Gynecology

## 2013-03-03 ENCOUNTER — Ambulatory Visit (INDEPENDENT_AMBULATORY_CARE_PROVIDER_SITE_OTHER): Payer: BC Managed Care – PPO | Admitting: Obstetrics & Gynecology

## 2013-03-03 VITALS — BP 122/82 | HR 72 | Resp 20 | Ht 65.0 in | Wt 211.0 lb

## 2013-03-03 DIAGNOSIS — Z01419 Encounter for gynecological examination (general) (routine) without abnormal findings: Secondary | ICD-10-CM

## 2013-03-03 DIAGNOSIS — Z0000 Encounter for general adult medical examination without abnormal findings: Secondary | ICD-10-CM

## 2013-03-03 LAB — POCT URINALYSIS DIPSTICK
Blood, UA: NEGATIVE
Glucose, UA: NEGATIVE
Nitrite, UA: NEGATIVE
Protein, UA: NEGATIVE
Urobilinogen, UA: NEGATIVE
pH, UA: 5

## 2013-03-03 NOTE — Progress Notes (Addendum)
52 y.o. G0P0 SingleCaucasianF here for annual exam.  Had SVT last summer and was treated medically.  Ended up having cardiac ablation.  Doing well now.  Released from cardiology.    Patient's last menstrual period was 08/05/2006.          Sexually active: yes  The current method of family planning is status post hysterectomy.    Exercising: yes  walking Smoker:  no  Health Maintenance: Pap:  08/07/06 ASCUS/negative HR HPV History of abnormal Pap:  no MMG:  01/30/12 mmg, 02/05/12 additional views-screening one year Colonoscopy:  2013 repeat in 10 years, Dr. Loreta Ave BMD:   10/30/10 normal spine, osteopenia in hip TDaP:  2005 Screening Labs: CMP today, Hb today: 13.5, Urine today: negative   reports that she has never smoked. She has never used smokeless tobacco. She reports that  drinks alcohol. She reports that she does not use illicit drugs.  Past Medical History  Diagnosis Date  . GERD (gastroesophageal reflux disease)   . Allergy   . Asthma   . Depression   . Back pain   . Hypertension   . 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    Past Surgical History  Procedure Laterality Date  . Abdominal hysterectomy    . Right arm reconstruction  2002  . Foot surgery    . Appendectomy    . Ablation of dysrhythmic focus  10/07/2012    SVT    Current Outpatient Prescriptions  Medication Sig Dispense Refill  . albuterol (PROVENTIL HFA;VENTOLIN HFA) 108 (90 BASE) MCG/ACT inhaler Inhale 2 puffs into the lungs every 6 (six) hours as needed. For wheezing      . cyclobenzaprine (FLEXERIL) 10 MG tablet Take 10 mg by mouth at bedtime.       Marland Kitchen dexlansoprazole (DEXILANT) 60 MG capsule Take 60 mg by mouth every morning.       . diclofenac (VOLTAREN) 75 MG EC tablet Take 75 mg by mouth daily.       Marland Kitchen losartan-hydrochlorothiazide (HYZAAR) 50-12.5 MG per tablet TAKE ONE TABLET BY MOUTH ONE TIME DAILY  30 tablet  4  . montelukast (SINGULAIR) 10 MG tablet Take 1 tablet (10 mg  total) by mouth at bedtime.  30 tablet  11  . venlafaxine XR (EFFEXOR-XR) 75 MG 24 hr capsule TAKE ONE CAPSULE BY MOUTH ONE TIME DAILY  30 capsule  0   No current facility-administered medications for this visit.    Family History  Problem Relation Age of Onset  . Alcohol abuse Mother   . Diabetes Mother   . Hyperlipidemia Mother   . Hypertension Mother   . Alzheimer's disease Father   . Diabetes Sister   . Hyperlipidemia Sister   . Hypertension Sister   . Heart attack Maternal Uncle   . Cancer Maternal Grandmother     lung  . Heart attack Maternal Grandfather   . Heart disease Mother     Rheumatic disease    ROS:  Pertinent items are noted in HPI.  Otherwise, a comprehensive ROS was negative.  Exam:   BP 122/82  Pulse 72  Resp 20  Ht 5\' 5"  (1.651 m)  Wt 211 lb (95.709 kg)  BMI 35.11 kg/m2  LMP 08/05/2006  Weight change: stable  Height: 5\' 5"  (165.1 cm)  Ht Readings from Last 3 Encounters:  03/03/13 5\' 5"  (1.651 m)  11/05/12 5' 4.3" (1.633 m)  10/07/12 5\' 4"  (1.626 m)  General appearance: alert, cooperative and appears stated age Head: Normocephalic, without obvious abnormality, atraumatic Neck: no adenopathy, supple, symmetrical, trachea midline and thyroid normal to inspection and palpation Lungs: clear to auscultation bilaterally Breasts: normal appearance, no masses or tenderness Heart: regular rate and rhythm Abdomen: soft, non-tender; bowel sounds normal; no masses,  no organomegaly Extremities: extremities normal, atraumatic, no cyanosis or edema Skin: Skin color, texture, turgor normal. No rashes or lesions Lymph nodes: Cervical, supraclavicular, and axillary nodes normal. No abnormal inguinal nodes palpated Neurologic: Grossly normal   Pelvic: External genitalia:  no lesions              Urethra:  normal appearing urethra with no masses, tenderness or lesions              Bartholins and Skenes: normal                 Vagina: normal appearing vagina  with normal color and discharge, no lesions              Cervix: absent              Pap taken: no Bimanual Exam:  Uterus:  uterus absent              Adnexa: normal adnexa and no mass, fullness, tenderness               Rectovaginal: Confirms               Anus:  normal sphincter tone, no lesions  A:  Well Woman with normal exam S/P TAH, not on HRT Osteopenia H/O colonic polyps Abnormal liver tests last year, never followed up. Elevated lipids.  Had not had follow-up.  Aware needs to see PCP for this.  P:   Mammogram yearly.  Pt knows due.  She states she will schedule. No pap smear indicated CMP today return annually or prn  An After Visit Summary was printed and given to the patient.

## 2013-03-03 NOTE — Patient Instructions (Signed)

## 2013-03-04 LAB — COMPREHENSIVE METABOLIC PANEL
Alkaline Phosphatase: 72 U/L (ref 39–117)
BUN: 14 mg/dL (ref 6–23)
CO2: 28 mEq/L (ref 19–32)
Glucose, Bld: 100 mg/dL — ABNORMAL HIGH (ref 70–99)
Sodium: 138 mEq/L (ref 135–145)
Total Bilirubin: 0.4 mg/dL (ref 0.3–1.2)
Total Protein: 7.6 g/dL (ref 6.0–8.3)

## 2013-03-22 ENCOUNTER — Other Ambulatory Visit: Payer: Self-pay | Admitting: Obstetrics & Gynecology

## 2013-03-22 NOTE — Telephone Encounter (Signed)
Pharmacy requesting Venlafaxine XR 75 mg   Please advise patient was given #30/0rf's 02/06/13

## 2013-03-26 ENCOUNTER — Encounter: Payer: Self-pay | Admitting: Physician Assistant

## 2013-03-26 ENCOUNTER — Ambulatory Visit (INDEPENDENT_AMBULATORY_CARE_PROVIDER_SITE_OTHER): Payer: BC Managed Care – PPO | Admitting: Physician Assistant

## 2013-03-26 VITALS — BP 115/77 | HR 106 | Temp 98.7°F | Wt 213.0 lb

## 2013-03-26 DIAGNOSIS — B3781 Candidal esophagitis: Secondary | ICD-10-CM

## 2013-03-26 DIAGNOSIS — B370 Candidal stomatitis: Secondary | ICD-10-CM | POA: Insufficient documentation

## 2013-03-26 MED ORDER — FLUCONAZOLE 150 MG PO TABS: ORAL_TABLET | ORAL | Status: DC

## 2013-03-26 NOTE — Progress Notes (Signed)
  Subjective:    Patient ID: Tracy Landry, female    DOB: 05/22/1961, 52 y.o.   MRN: [id number]  HPI Patient presents to the clinic with sore throat and difficulty eating and drinking. Patient has a history of esophageal and now thrush. This usually occurs after extended prednisone. Her orthopedist put her on 12 days of prednisone. About 3 or 4 days later she started having burning in her chest as well as difficulty drinking and eating. The first time this happened 7 years ago she had a EGD done and found esophageal thrush. She uses mouthwash to get rid of thrush and mouth but does not help with the esophageal symptoms. Patient declines any fever, chills, sinus pressure, ear pain, cough, shortness of breath or wheezing.    Review of Systems     Objective:   Physical Exam  Constitutional: She appears well-developed and well-nourished.  HENT:  Head: Normocephalic and atraumatic.  I do see some white thrush at the back of the oropharynx.  Neck: Normal range of motion. Neck supple.  Cardiovascular: Regular rhythm and normal heart sounds.    Tachycardia at 106.  Pulmonary/Chest: Effort normal and breath sounds normal.  Lymphadenopathy:    She has no cervical adenopathy.          Assessment & Plan:  Esophageal thrush-will treat with Diflucan 1 tab and if symptoms do not resolve 3 days later taken another tablet. Call if symptoms still have not resolved. I'm treating on the basis of past history of this and these pain symptoms.

## 2013-04-02 ENCOUNTER — Other Ambulatory Visit: Payer: Self-pay | Admitting: Obstetrics & Gynecology

## 2013-04-02 DIAGNOSIS — Z1231 Encounter for screening mammogram for malignant neoplasm of breast: Secondary | ICD-10-CM

## 2013-04-20 ENCOUNTER — Ambulatory Visit (INDEPENDENT_AMBULATORY_CARE_PROVIDER_SITE_OTHER): Payer: BC Managed Care – PPO

## 2013-04-20 DIAGNOSIS — Z1231 Encounter for screening mammogram for malignant neoplasm of breast: Secondary | ICD-10-CM

## 2013-04-22 ENCOUNTER — Ambulatory Visit: Payer: BC Managed Care – PPO

## 2013-04-29 ENCOUNTER — Ambulatory Visit (INDEPENDENT_AMBULATORY_CARE_PROVIDER_SITE_OTHER): Payer: BC Managed Care – PPO | Admitting: Family Medicine

## 2013-04-29 DIAGNOSIS — Z23 Encounter for immunization: Secondary | ICD-10-CM

## 2013-04-29 NOTE — Progress Notes (Signed)
  Subjective:    Patient ID: Tracy Landry, female    DOB: [birth year], 52 y.o.   MRN: [id number]  HPI    Review of Systems     Objective:   Physical Exam        Assessment & Plan:  Here for flu vaccine

## 2013-05-02 ENCOUNTER — Other Ambulatory Visit: Payer: Self-pay | Admitting: Physician Assistant

## 2013-05-07 ENCOUNTER — Other Ambulatory Visit: Payer: Self-pay | Admitting: *Deleted

## 2013-05-07 MED ORDER — CYCLOBENZAPRINE HCL 10 MG PO TABS: 10.0000 mg | ORAL_TABLET | Freq: Every day | ORAL | Status: DC

## 2013-05-31 ENCOUNTER — Ambulatory Visit (INDEPENDENT_AMBULATORY_CARE_PROVIDER_SITE_OTHER): Payer: BC Managed Care – PPO | Admitting: Obstetrics & Gynecology

## 2013-05-31 ENCOUNTER — Institutional Professional Consult (permissible substitution): Payer: BC Managed Care – PPO | Admitting: Obstetrics & Gynecology

## 2013-05-31 ENCOUNTER — Encounter: Payer: Self-pay | Admitting: Obstetrics & Gynecology

## 2013-05-31 VITALS — BP 128/84 | HR 64 | Resp 16 | Ht 65.5 in | Wt 215.0 lb

## 2013-05-31 DIAGNOSIS — F329 Major depressive disorder, single episode, unspecified: Secondary | ICD-10-CM

## 2013-05-31 DIAGNOSIS — F32A Depression, unspecified: Secondary | ICD-10-CM

## 2013-05-31 MED ORDER — VENLAFAXINE HCL ER 37.5 MG PO CP24: ORAL_CAPSULE | ORAL | Status: DC

## 2013-05-31 NOTE — Patient Instructions (Signed)
Please call and let me know if you have any new problems or concerns.

## 2013-05-31 NOTE — Progress Notes (Signed)
52 y.o. Partnered Caucasian female G0P0 here to discussed antidepressant therapy.  Having lots of stress as her father is really aging and failing.  Meeting with Dr. Sonia Baller, who is doing in-home care.  This is a new provider for him.  Father has Alzheimer's and they are needing to begin making long term care decisions and end of decisions.  Patient has sister that she and partner call "Puddles" because all she does is cry when they are discussing issues about her father.  Significant other feels antidepressant needs to be increased.  They are concerned about sexual side effects and that she will need more when her father dies, so she doesn't want to increase now if this is the last dosage increase possible.  D/W pt can increase by 37.5mg  intervals to 225mg .  On XL dosage.  Can also add a second medication if sexual side effects become unacceptable.  Pt and partner are comfortable going up by small intervals.  Side effects discussed.  All questions answered.  O: Healthy WD,WN female Affect: normal  A:Depression/anxiety  P: D/W pt dosage changes.  Will increase by 37.5mg  XL to total of 112.5mg  XL.  Rx to pharmacy.  All questions answered.  ~15 minutes spent with patient >50% of time was in face to face discussion of above.

## 2013-06-11 ENCOUNTER — Ambulatory Visit (INDEPENDENT_AMBULATORY_CARE_PROVIDER_SITE_OTHER): Payer: BC Managed Care – PPO | Admitting: Family Medicine

## 2013-06-11 ENCOUNTER — Other Ambulatory Visit: Payer: Self-pay | Admitting: *Deleted

## 2013-06-11 ENCOUNTER — Other Ambulatory Visit: Payer: Self-pay | Admitting: Orthopedic Surgery

## 2013-06-11 ENCOUNTER — Encounter: Payer: Self-pay | Admitting: Family Medicine

## 2013-06-11 VITALS — BP 142/94 | HR 91 | Temp 98.1°F | Wt 214.0 lb

## 2013-06-11 DIAGNOSIS — H8110 Benign paroxysmal vertigo, unspecified ear: Secondary | ICD-10-CM

## 2013-06-11 DIAGNOSIS — J329 Chronic sinusitis, unspecified: Secondary | ICD-10-CM

## 2013-06-11 DIAGNOSIS — B9689 Other specified bacterial agents as the cause of diseases classified elsewhere: Secondary | ICD-10-CM

## 2013-06-11 DIAGNOSIS — A499 Bacterial infection, unspecified: Secondary | ICD-10-CM

## 2013-06-11 MED ORDER — AMOXICILLIN-POT CLAVULANATE 500-125 MG PO TABS: ORAL_TABLET | ORAL | Status: DC

## 2013-06-11 MED ORDER — CYCLOBENZAPRINE HCL 10 MG PO TABS: 10.0000 mg | ORAL_TABLET | Freq: Every day | ORAL | Status: DC

## 2013-06-11 MED ORDER — DICLOFENAC SODIUM 75 MG PO TBEC: 75.0000 mg | DELAYED_RELEASE_TABLET | Freq: Every day | ORAL | Status: DC

## 2013-06-11 MED ORDER — MECLIZINE HCL 25 MG PO TABS: 25.0000 mg | ORAL_TABLET | Freq: Three times a day (TID) | ORAL | Status: DC | PRN

## 2013-06-11 NOTE — Progress Notes (Signed)
CC: Tracy Landry is a 52 y.o. female is here for dizziness x 1 week   Subjective: HPI:  Complains of dizziness has been present since Monday moderate severity present on daily basis worse first thing in the morning can be reproduced with sudden head movements or when lying down flat. Relatively absent when stationary nothing else makes better or worse. Has tried Dramamine no benefit. Denies head injury, fevers, chills, tinnitus, hearing loss, vision loss confusion nor motor or sensory disturbances  Complains of pressure in the forehead that has also been present since last Friday. Worse first thing in the morning nothing else makes it better or worse. Associated with thick nasal congestion and subjective postnasal drip. Denies cough, wheezing, shortness of breath  Review Of Systems Outlined In HPI  Past Medical History  Diagnosis Date  . GERD (gastroesophageal reflux disease)   . Asthma   . Depression   . Back pain   . Hypertension   . 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     Family History  Problem Relation Age of Onset  . Alcohol abuse Mother   . Diabetes Mother   . Hyperlipidemia Mother   . Hypertension Mother   . Alzheimer's disease Father   . Diabetes Sister   . Hyperlipidemia Sister   . Hypertension Sister   . Heart attack Maternal Uncle   . Cancer Maternal Grandmother     lung  . Heart attack Maternal Grandfather   . Heart disease Mother     Rheumatic disease     History  Substance Use Topics  . Smoking status: Never Smoker   . Smokeless tobacco: Never Used  . Alcohol Use: 0.0 oz/week     Comment: 1-2 glasses wine per night     Objective: Filed Vitals:   06/11/13 1025  BP: 142/94  Pulse: 91  Temp: 98.1 F (36.7 C)    General: Alert and Oriented, No Acute Distress HEENT: Pupils equal, round, reactive to light. Conjunctivae clear.  External ears unremarkable, canals clear with intact TMs with appropriate landmarks.  Right and left middle ear with mild serous effusion. Pink inferior turbinates.  Moist mucous membranes, pharynx without inflammation nor lesions.  Neck supple without palpable lymphadenopathy nor abnormal masses. Frontal sinus tenderness to percussion Lungs: Clear to auscultation bilaterally, no wheezing/ronchi/rales.  Comfortable work of breathing. Good air movement. Cardiac: Regular rate and rhythm. Normal S1/S2.  No murmurs, rubs, nor gallops.   Neuro: CN II-XII grossly intact, full strength/rom of all four extremities, C5/L4/S1 DTRs 2/4 bilaterally, gait normal, rapid alternating movements normal, Rhomberg normal. Patient politely declines Dix-Hallpike Extremities: No peripheral edema.  Strong peripheral pulses.  Mental Status: No depression, anxiety, nor agitation. Skin: Warm and dry.  Assessment & Plan: Kaedance was seen today for dizziness x 1 week.  Diagnoses and associated orders for this visit:  BPV (benign positional vertigo) - meclizine (ANTIVERT) 25 MG tablet; Take 1 tablet (25 mg total) by mouth 3 (three) times daily as needed for dizziness or nausea.  Bacterial sinusitis - amoxicillin-clavulanate (AUGMENTIN) 500-125 MG per tablet; Take one by mouth every 8 hours for ten total days.    BP V.: Start meclizine as needed and modified Epley maneuver to be done 3 times a day as tolerated. Signs and symptoms requring emergent/urgent reevaluation were discussed with the patient. Bacterial sinusitis: Start Augmentin consider Alka-Seltzer cold and sinus if needed for symptoms  Return if symptoms worsen or fail to improve.

## 2013-06-21 ENCOUNTER — Telehealth: Payer: Self-pay | Admitting: Obstetrics & Gynecology

## 2013-06-21 NOTE — Telephone Encounter (Signed)
Spoke with pt about rash that started Sunday night. Pt finished a round of abx and thought it might be yeast. Pt took diflucan. Now the area is tender and has larger whelps. Sched appt with DL tomorrow at 10 am.

## 2013-06-21 NOTE — Telephone Encounter (Signed)
Pt has a rash in her vaginal area. Please call to schedule an appointment.

## 2013-06-22 ENCOUNTER — Ambulatory Visit (INDEPENDENT_AMBULATORY_CARE_PROVIDER_SITE_OTHER): Payer: BC Managed Care – PPO | Admitting: Certified Nurse Midwife

## 2013-06-22 ENCOUNTER — Encounter: Payer: Self-pay | Admitting: Certified Nurse Midwife

## 2013-06-22 VITALS — BP 112/70 | HR 68 | Resp 16 | Ht 65.0 in | Wt 211.0 lb

## 2013-06-22 DIAGNOSIS — N9089 Other specified noninflammatory disorders of vulva and perineum: Secondary | ICD-10-CM

## 2013-06-22 DIAGNOSIS — N762 Acute vulvitis: Secondary | ICD-10-CM

## 2013-06-22 DIAGNOSIS — N760 Acute vaginitis: Secondary | ICD-10-CM

## 2013-06-22 DIAGNOSIS — N907 Vulvar cyst: Secondary | ICD-10-CM

## 2013-06-22 MED ORDER — NYSTATIN-TRIAMCINOLONE 100000-0.1 UNIT/GM-% EX CREA: 1.0000 "application " | TOPICAL_CREAM | Freq: Two times a day (BID) | CUTANEOUS | Status: DC

## 2013-06-22 NOTE — Progress Notes (Signed)
51 y.o.Single Caucasian female G0P0 with a 4 day(s) history of the following vaginal:bumps, discharge described as white, no odor and local irritation Sexually active: yes Last sexual activity:30days ago. Pt also reports the following associated symptoms: none Patient has tried Diflucan she had with no relief. Patient female/female long term relationship. Patient taking Amoxicillin for sinus infection and took Diflucan thinking she yeast infection. Recent trip to beach, but no wet clothing for any period of time. 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 No STD concerns. No history of herpes or cold sores.. Patient describes bumps as blisters that are painful, no itching. She also has a red bump near clitoris, no drainage.  O: Healthy female, WDWN Affect: normal  Partner with patient Inguinal lymph nodes not enlarged,but left slightly tender   Exam:  ZOX:[id number], Urethra, Skene's normal, red blister like papules noted on inner vulva area, bilateral, tender to touch, Small slightly red cyst noted on left outside of clitoral area, slightly tender to touch, soft, no exudate Cultures taken and wet prep taken                Vag:no lesions, discharge: scant and white, pH 4.0, wet prep done                Cx:  absent                Uterus:absent                Adnexa: no mass, fullness, tenderness  Wet Prep shows:positive for yeast on skin only   A: Vulvitis 2- Suspicious of HSV outbreak, R/O 3-Vulvar cyst no abscess   P: Reviewed findings and need for Rx, maybe due to Amoxicillin use.  Rx Mycolog see order 2-HSV culture, Culture for MRSA,strep,staph, HSV1,2 serology  Discussed appearance suspicious of HSV,patient no known exposure ever. Offered Valtrex use based on appearance declined. 3-Discussed findings and instructed to do epsom salt soak to area tid and recheck here in 2 days. Warning signs given and need to advise. Questions answered at length with patient and partner.  Rv 2 days,  prn

## 2013-06-23 LAB — HSV(HERPES SIMPLEX VRS) I + II AB-IGG
HSV 1 Glycoprotein G Ab, IgG: 0.13 IV
HSV 2 Glycoprotein G Ab, IgG: 0.18 IV

## 2013-06-24 ENCOUNTER — Ambulatory Visit (INDEPENDENT_AMBULATORY_CARE_PROVIDER_SITE_OTHER): Payer: BC Managed Care – PPO | Admitting: Certified Nurse Midwife

## 2013-06-24 VITALS — BP 104/64 | HR 68 | Resp 16 | Ht 65.0 in | Wt 212.0 lb

## 2013-06-24 DIAGNOSIS — N760 Acute vaginitis: Secondary | ICD-10-CM

## 2013-06-24 DIAGNOSIS — N9089 Other specified noninflammatory disorders of vulva and perineum: Secondary | ICD-10-CM

## 2013-06-24 LAB — HERPES SIMPLEX VIRUS CULTURE: Organism ID, Bacteria: NOT DETECTED

## 2013-06-24 NOTE — Progress Notes (Signed)
Note reviewed, agree with plan.  Jonita Hirota, MD  

## 2013-06-24 NOTE — Progress Notes (Signed)
Note reviewed, agree with plan.  Jackey Housey, MD  

## 2013-06-24 NOTE — Progress Notes (Signed)
51 y.o. Partnered Caucasian female G0P0 here for follow up of vulvitis and vulva cyst  treated with Mycolog cream and epsom salt tub baths initiated on November 11,2014. Patient did not fill Rx, but has been using epsom salt tub baths 3 times daily with good relief. Denies any discharge from cyst and feels it is resolving. Red blister area are subsiding, still slightly tender. No new personal products.  O: Healthy WD,WN female Affect: Normal AOZ:[id number] pending HSV1,11 serology negative-reviewed with patient  Pelvic exam:EXTERNAL GENITALIA: vulva area not red, inside vulva lip area fading pink areas are noted with out scaling or blister appearance now. No exudate,no edema, cyst on left at clitoral hood,very soft, no edema, non tender,tiny to palpation now. VAGINA: no abnormal discharge or lesions RECTUM: normal with out lesions on external appearance  A:Vulvar cyst resolving Red blister areas resolving.  Cultures pending Suspect chemical reaction from unknown substance while traveling with rapid change in appearance now.    P: Discussed findings of improvement and cyst almost totally resolved. Encouraged patient to fill Rx and use on vulvar area due to wet prep results. Patient will consider. Discussed suspect chemical reaction or allergy. Questions addressed. Continue epsom salt bath until cyst totally resolved. Will notify patient with culture results when reviewed.  RV prn

## 2013-06-25 LAB — WOUND CULTURE

## 2013-06-28 ENCOUNTER — Telehealth: Payer: Self-pay

## 2013-06-28 NOTE — Telephone Encounter (Signed)
lmtcb

## 2013-06-28 NOTE — Telephone Encounter (Signed)
Message copied by Eliezer Bottom on Mon Jun 28, 2013  4:52 PM ------      Message from: Verner Chol      Created: Mon Jun 28, 2013  4:35 PM       Notify patient Tracy Landry      Wound culture showed Klebisella, has she be around anyone with respiratory problems or in nursing home?      Patient status with red area on each side of labia? ------

## 2013-06-29 ENCOUNTER — Encounter (HOSPITAL_BASED_OUTPATIENT_CLINIC_OR_DEPARTMENT_OTHER): Payer: Self-pay | Admitting: *Deleted

## 2013-06-29 ENCOUNTER — Other Ambulatory Visit: Payer: Self-pay | Admitting: Certified Nurse Midwife

## 2013-06-29 DIAGNOSIS — L089 Local infection of the skin and subcutaneous tissue, unspecified: Secondary | ICD-10-CM

## 2013-06-29 MED ORDER — CIPROFLOXACIN HCL 500 MG PO TABS: ORAL_TABLET | ORAL | Status: DC

## 2013-06-29 NOTE — Progress Notes (Signed)
Will come in for bmet-had ekg 4/14-after cardiac ablation

## 2013-06-30 ENCOUNTER — Encounter: Payer: Self-pay | Admitting: Certified Nurse Midwife

## 2013-06-30 ENCOUNTER — Encounter (HOSPITAL_BASED_OUTPATIENT_CLINIC_OR_DEPARTMENT_OTHER)
Admission: RE | Admit: 2013-06-30 | Discharge: 2013-06-30 | Disposition: A | Discharge status: Home / Self Care | Payer: BC Managed Care – PPO | Source: Ambulatory Visit | Attending: Orthopedic Surgery | Admitting: Orthopedic Surgery

## 2013-06-30 DIAGNOSIS — Z01818 Encounter for other preprocedural examination: Secondary | ICD-10-CM | POA: Insufficient documentation

## 2013-06-30 DIAGNOSIS — Z01812 Encounter for preprocedural laboratory examination: Secondary | ICD-10-CM | POA: Insufficient documentation

## 2013-06-30 LAB — BASIC METABOLIC PANEL
CO2: 30 mEq/L (ref 19–32)
Calcium: 9.5 mg/dL (ref 8.4–10.5)
Chloride: 101 mEq/L (ref 96–112)
Creatinine, Ser: 0.62 mg/dL (ref 0.50–1.10)
GFR calc Af Amer: >90 mL/min (ref >90)
Sodium: 139 mEq/L (ref 135–145)

## 2013-06-30 NOTE — Telephone Encounter (Signed)
Patient notified of results. See lab result in epic

## 2013-07-07 ENCOUNTER — Ambulatory Visit (HOSPITAL_BASED_OUTPATIENT_CLINIC_OR_DEPARTMENT_OTHER): Payer: BC Managed Care – PPO | Admitting: *Deleted

## 2013-07-07 ENCOUNTER — Ambulatory Visit (HOSPITAL_BASED_OUTPATIENT_CLINIC_OR_DEPARTMENT_OTHER)
Admission: RE | Admit: 2013-07-07 | Discharge: 2013-07-07 | Disposition: A | Discharge status: Home / Self Care | Payer: BC Managed Care – PPO | Source: Ambulatory Visit | Attending: Orthopedic Surgery | Admitting: Orthopedic Surgery

## 2013-07-07 ENCOUNTER — Encounter (HOSPITAL_BASED_OUTPATIENT_CLINIC_OR_DEPARTMENT_OTHER)
Admission: RE | Disposition: A | Discharge status: Home / Self Care | Payer: Self-pay | Source: Ambulatory Visit | Attending: Orthopedic Surgery

## 2013-07-07 ENCOUNTER — Encounter (HOSPITAL_BASED_OUTPATIENT_CLINIC_OR_DEPARTMENT_OTHER): Payer: Self-pay | Admitting: Orthopedic Surgery

## 2013-07-07 ENCOUNTER — Encounter (HOSPITAL_BASED_OUTPATIENT_CLINIC_OR_DEPARTMENT_OTHER): Payer: BC Managed Care – PPO | Admitting: *Deleted

## 2013-07-07 DIAGNOSIS — S61209A Unspecified open wound of unspecified finger without damage to nail, initial encounter: Secondary | ICD-10-CM | POA: Insufficient documentation

## 2013-07-07 DIAGNOSIS — Z885 Allergy status to narcotic agent status: Secondary | ICD-10-CM | POA: Insufficient documentation

## 2013-07-07 DIAGNOSIS — X58XXXA Exposure to other specified factors, initial encounter: Secondary | ICD-10-CM | POA: Insufficient documentation

## 2013-07-07 DIAGNOSIS — M6740 Ganglion, unspecified site: Secondary | ICD-10-CM | POA: Insufficient documentation

## 2013-07-07 DIAGNOSIS — I10 Essential (primary) hypertension: Secondary | ICD-10-CM | POA: Insufficient documentation

## 2013-07-07 HISTORY — PX: REPAIR EXTENSOR TENDON: SHX5382

## 2013-07-07 HISTORY — PX: MASS EXCISION: SHX2000

## 2013-07-07 HISTORY — DX: Presence of spectacles and contact lenses: Z97.3

## 2013-07-07 LAB — POCT HEMOGLOBIN-HEMACUE: Hemoglobin: 13.7 g/dL (ref 12.0–15.0)

## 2013-07-07 SURGERY — EXCISION MASS
Anesthesia: Regional | Site: Finger | Laterality: Left

## 2013-07-07 MED ORDER — BUPIVACAINE-EPINEPHRINE PF 0.5-1:200000 % IJ SOLN
INTRAMUSCULAR | Status: DC | PRN
  Administered 2013-07-07: 30 mL via PERINEURAL

## 2013-07-07 MED ORDER — LIDOCAINE HCL (CARDIAC) 20 MG/ML IV SOLN
INTRAVENOUS | Status: DC | PRN
  Administered 2013-07-07: 60 mg via INTRAVENOUS

## 2013-07-07 MED ORDER — CEFAZOLIN SODIUM-DEXTROSE 2-3 GM-% IV SOLR
2.0000 g | INTRAVENOUS | Status: AC
  Administered 2013-07-07: 2 g via INTRAVENOUS

## 2013-07-07 MED ORDER — CHLORHEXIDINE GLUCONATE 4 % EX LIQD: 60.0000 mL | Freq: Once | CUTANEOUS | Status: DC

## 2013-07-07 MED ORDER — LACTATED RINGERS IV SOLN
INTRAVENOUS | Status: DC
  Administered 2013-07-07 (×2): via INTRAVENOUS

## 2013-07-07 MED ORDER — HYDROCODONE-ACETAMINOPHEN 5-325 MG PO TABS: 1.0000 | ORAL_TABLET | Freq: Four times a day (QID) | ORAL | Status: DC | PRN

## 2013-07-07 MED ORDER — ONDANSETRON HCL 4 MG/2ML IJ SOLN
INTRAMUSCULAR | Status: DC | PRN
  Administered 2013-07-07: 4 mg via INTRAVENOUS

## 2013-07-07 MED ORDER — OXYCODONE HCL 5 MG/5ML PO SOLN: 5.0000 mg | Freq: Once | ORAL | Status: DC | PRN

## 2013-07-07 MED ORDER — FENTANYL CITRATE 0.05 MG/ML IJ SOLN
INTRAMUSCULAR | Status: AC
  Filled 2013-07-07: qty 6

## 2013-07-07 MED ORDER — MIDAZOLAM HCL 2 MG/2ML IJ SOLN
INTRAMUSCULAR | Status: AC
  Filled 2013-07-07: qty 2

## 2013-07-07 MED ORDER — DEXAMETHASONE SODIUM PHOSPHATE 10 MG/ML IJ SOLN
INTRAMUSCULAR | Status: DC | PRN
  Administered 2013-07-07: 10 mg via INTRAVENOUS

## 2013-07-07 MED ORDER — MEPERIDINE HCL 25 MG/ML IJ SOLN: 6.2500 mg | INTRAMUSCULAR | Status: DC | PRN

## 2013-07-07 MED ORDER — BUPIVACAINE HCL (PF) 0.25 % IJ SOLN
INTRAMUSCULAR | Status: AC
  Filled 2013-07-07: qty 30

## 2013-07-07 MED ORDER — ONDANSETRON HCL 4 MG/2ML IJ SOLN
4.0000 mg | Freq: Once | INTRAMUSCULAR | Status: AC | PRN
  Administered 2013-07-07: 4 mg via INTRAVENOUS

## 2013-07-07 MED ORDER — FENTANYL CITRATE 0.05 MG/ML IJ SOLN: INTRAMUSCULAR | Status: DC | PRN

## 2013-07-07 MED ORDER — OXYCODONE HCL 5 MG PO TABS: 5.0000 mg | ORAL_TABLET | Freq: Once | ORAL | Status: DC | PRN

## 2013-07-07 MED ORDER — ONDANSETRON HCL 4 MG/2ML IJ SOLN
INTRAMUSCULAR | Status: AC
  Filled 2013-07-07: qty 2

## 2013-07-07 MED ORDER — FENTANYL CITRATE 0.05 MG/ML IJ SOLN
50.0000 ug | INTRAMUSCULAR | Status: DC | PRN
  Administered 2013-07-07: 100 ug via INTRAVENOUS

## 2013-07-07 MED ORDER — PROPOFOL 10 MG/ML IV BOLUS
INTRAVENOUS | Status: DC | PRN
  Administered 2013-07-07: 200 mg via INTRAVENOUS
  Administered 2013-07-07: 30 mg via INTRAVENOUS

## 2013-07-07 MED ORDER — HYDROMORPHONE HCL PF 1 MG/ML IJ SOLN: 0.2500 mg | INTRAMUSCULAR | Status: DC | PRN

## 2013-07-07 MED ORDER — FENTANYL CITRATE 0.05 MG/ML IJ SOLN
INTRAMUSCULAR | Status: AC
Start: 2013-07-07 — End: 2013-07-07
  Filled 2013-07-07: qty 2

## 2013-07-07 MED ORDER — MIDAZOLAM HCL 2 MG/2ML IJ SOLN
1.0000 mg | INTRAMUSCULAR | Status: DC | PRN
  Administered 2013-07-07: 2 mg via INTRAVENOUS

## 2013-07-07 MED ORDER — BUPIVACAINE HCL (PF) 0.25 % IJ SOLN
INTRAMUSCULAR | Status: DC | PRN
  Administered 2013-07-07: 6 mL

## 2013-07-07 MED ORDER — FENTANYL CITRATE 0.05 MG/ML IJ SOLN
INTRAMUSCULAR | Status: DC | PRN
  Administered 2013-07-07: 50 ug via INTRAVENOUS

## 2013-07-07 SURGICAL SUPPLY — 87 items
BAG DECANTER FOR FLEXI CONT (MISCELLANEOUS) IMPLANT
BANDAGE COBAN STERILE 2 (GAUZE/BANDAGES/DRESSINGS) IMPLANT
BLADE MINI RND TIP GREEN BEAV (BLADE) IMPLANT
BLADE SURG 15 STRL LF DISP TIS (BLADE) ×1 IMPLANT
BLADE SURG 15 STRL SS (BLADE) ×1
BNDG COHESIVE 1X5 TAN STRL LF (GAUZE/BANDAGES/DRESSINGS) ×2 IMPLANT
BNDG COHESIVE 3X5 TAN STRL LF (GAUZE/BANDAGES/DRESSINGS) IMPLANT
BNDG ESMARK 4X9 LF (GAUZE/BANDAGES/DRESSINGS) ×2 IMPLANT
BNDG GAUZE ELAST 4 BULKY (GAUZE/BANDAGES/DRESSINGS) IMPLANT
CHLORAPREP W/TINT 26ML (MISCELLANEOUS) ×2 IMPLANT
CORDS BIPOLAR (ELECTRODE) ×2 IMPLANT
COTTONBALL LRG STERILE PKG (GAUZE/BANDAGES/DRESSINGS) IMPLANT
COVER MAYO STAND STRL (DRAPES) ×2 IMPLANT
COVER TABLE BACK 60X90 (DRAPES) ×2 IMPLANT
CUFF TOURNIQUET SINGLE 18IN (TOURNIQUET CUFF) ×2 IMPLANT
DECANTER SPIKE VIAL GLASS SM (MISCELLANEOUS) IMPLANT
DRAIN PENROSE 1/2X12 LTX STRL (WOUND CARE) IMPLANT
DRAIN TLS ROUND 10FR (DRAIN) IMPLANT
DRAPE EXTREMITY T 121X128X90 (DRAPE) ×2 IMPLANT
DRAPE OEC MINIVIEW 54X84 (DRAPES) IMPLANT
DRAPE SURG 17X23 STRL (DRAPES) ×2 IMPLANT
DRSG KUZMA FLUFF (GAUZE/BANDAGES/DRESSINGS) IMPLANT
GAUZE SPONGE 4X4 16PLY XRAY LF (GAUZE/BANDAGES/DRESSINGS) IMPLANT
GAUZE XEROFORM 1X8 LF (GAUZE/BANDAGES/DRESSINGS) ×2 IMPLANT
GLOVE BIO SURGEON STRL SZ 6.5 (GLOVE) IMPLANT
GLOVE BIOGEL PI IND STRL 7.0 (GLOVE) ×1 IMPLANT
GLOVE BIOGEL PI IND STRL 8.5 (GLOVE) ×1 IMPLANT
GLOVE BIOGEL PI INDICATOR 7.0 (GLOVE) ×1
GLOVE BIOGEL PI INDICATOR 8.5 (GLOVE) ×1
GLOVE ECLIPSE 6.5 STRL STRAW (GLOVE) ×2 IMPLANT
GLOVE EXAM NITRILE LRG STRL (GLOVE) ×2 IMPLANT
GLOVE SURG ORTHO 8.0 STRL STRW (GLOVE) ×2 IMPLANT
GOWN BRE IMP PREV XXLGXLNG (GOWN DISPOSABLE) ×2 IMPLANT
GOWN PREVENTION PLUS XLARGE (GOWN DISPOSABLE) ×2 IMPLANT
K-WIRE .035X4 (WIRE) IMPLANT
LOOP VESSEL MAXI BLUE (MISCELLANEOUS) IMPLANT
NDL SAFETY ECLIPSE 18X1.5 (NEEDLE) ×1 IMPLANT
NEEDLE 27GAX1X1/2 (NEEDLE) ×2 IMPLANT
NEEDLE HYPO 18GX1.5 SHARP (NEEDLE) ×1
NEEDLE HYPO 22GX1.5 SAFETY (NEEDLE) IMPLANT
NEEDLE KEITH (NEEDLE) IMPLANT
NS IRRIG 1000ML POUR BTL (IV SOLUTION) ×2 IMPLANT
PACK BASIN DAY SURGERY FS (CUSTOM PROCEDURE TRAY) ×2 IMPLANT
PAD CAST 3X4 CTTN HI CHSV (CAST SUPPLIES) IMPLANT
PADDING CAST ABS 3INX4YD NS (CAST SUPPLIES)
PADDING CAST ABS 4INX4YD NS (CAST SUPPLIES) ×1
PADDING CAST ABS COTTON 3X4 (CAST SUPPLIES) IMPLANT
PADDING CAST ABS COTTON 4X4 ST (CAST SUPPLIES) ×1 IMPLANT
PADDING CAST COTTON 3X4 STRL (CAST SUPPLIES)
SLEEVE SCD COMPRESS KNEE MED (MISCELLANEOUS) ×2 IMPLANT
SLING ARM FOAM STRAP LRG (SOFTGOODS) ×2 IMPLANT
SPLINT FNGR PLAIN END 5/8X3.25 (CAST SUPPLIES) ×1 IMPLANT
SPLINT PLASTALUME 3 1/4 (CAST SUPPLIES) ×2
SPLINT PLASTER CAST XFAST 3X15 (CAST SUPPLIES) IMPLANT
SPLINT PLASTER XTRA FASTSET 3X (CAST SUPPLIES)
SPONGE GAUZE 4X4 12PLY (GAUZE/BANDAGES/DRESSINGS) ×2 IMPLANT
STOCKINETTE 4X48 STRL (DRAPES) ×2 IMPLANT
SUT CHROMIC 5 0 P 3 (SUTURE) IMPLANT
SUT ETHIBOND 3-0 V-5 (SUTURE) IMPLANT
SUT ETHILON 5 0 PC 1 (SUTURE) ×2 IMPLANT
SUT FIBERWIRE 2-0 18 17.9 3/8 (SUTURE)
SUT FIBERWIRE 4-0 18 TAPR NDL (SUTURE)
SUT MERSILENE 2.0 SH NDLE (SUTURE) IMPLANT
SUT MERSILENE 3 0 FS 1 (SUTURE) IMPLANT
SUT MERSILENE 4 0 P 3 (SUTURE) IMPLANT
SUT MERSILENE 5 0 P 3 (SUTURE) ×2 IMPLANT
SUT POLY BUTTON 15MM (SUTURE) IMPLANT
SUT PROLENE 2 0 SH DA (SUTURE) IMPLANT
SUT SILK 2 0 FS (SUTURE) IMPLANT
SUT SILK 4 0 PS 2 (SUTURE) IMPLANT
SUT STEEL 3 0 (SUTURE) IMPLANT
SUT STEEL 4 0 V 26 (SUTURE) IMPLANT
SUT VIC AB 3-0 PS1 18 (SUTURE)
SUT VIC AB 3-0 PS1 18XBRD (SUTURE) IMPLANT
SUT VIC AB 4-0 P-3 18XBRD (SUTURE) IMPLANT
SUT VIC AB 4-0 P2 18 (SUTURE) IMPLANT
SUT VIC AB 4-0 P3 18 (SUTURE)
SUT VICRYL 4-0 PS2 18IN ABS (SUTURE) IMPLANT
SUT VICRYL RAPID 5 0 P 3 (SUTURE) IMPLANT
SUT VICRYL RAPIDE 4/0 PS 2 (SUTURE) ×4 IMPLANT
SUTURE FIBERWR 2-0 18 17.9 3/8 (SUTURE) IMPLANT
SUTURE FIBERWR 4-0 18 TAPR NDL (SUTURE) IMPLANT
SYR BULB 3OZ (MISCELLANEOUS) ×2 IMPLANT
SYR CONTROL 10ML LL (SYRINGE) ×2 IMPLANT
TOWEL OR 17X24 6PK STRL BLUE (TOWEL DISPOSABLE) ×4 IMPLANT
TUBE FEEDING 5FR 15 INCH (TUBING) IMPLANT
UNDERPAD 30X30 INCONTINENT (UNDERPADS AND DIAPERS) ×2 IMPLANT

## 2013-07-07 NOTE — Brief Op Note (Signed)
07/07/2013  10:40 AM  PATIENT:  Tracy Landry  52 y.o. female  PRE-OPERATIVE DIAGNOSIS:  LACERATION PROXIMAL INTERPHALANGEAL JOINT LEFT RING FINGER,MASS DORSAL  POST-OPERATIVE DIAGNOSIS:  LACERATION PROXIMAL INTERPHALANGEAL JOINT LEFT RING FINGER,MASS DORSAL  PROCEDURE:  Procedure(s): EXCISION MASS LEFT RING FINGER (Left) REPAIR EXTENSOR TENDON LEFT WITH DEBRIDMENT PROXIMAL INTERPHALANGEAL JOINT (Left)  SURGEON:  Surgeon(s) and Role:    * Nicki Reaper, MD - Primary  PHYSICIAN ASSISTANT:   ASSISTANTS: none   ANESTHESIA:   local and regional  EBL:  Total I/O In: 1000 [I.V.:1000] Out: -   BLOOD ADMINISTERED:none  DRAINS: none   LOCAL MEDICATIONS USED:  BUPIVICAINE   SPECIMEN:  No Specimen  DISPOSITION OF SPECIMEN:  N/A  COUNTS:  YES  TOURNIQUET:   Total Tourniquet Time Documented: Upper Arm (Left) - 15 minutes Total: Upper Arm (Left) - 15 minutes   DICTATION: .Other Dictation: Dictation Number [id number]  PLAN OF CARE: Discharge to home after PACU  PATIENT DISPOSITION:  PACU - hemodynamically stable.

## 2013-07-07 NOTE — Op Note (Signed)
Dictation Number [id number]

## 2013-07-07 NOTE — Anesthesia Postprocedure Evaluation (Signed)
Anesthesia Post Note  Patient: Tracy Landry  Procedure(s) Performed: Procedure(s) (LRB): EXCISION MASS LEFT RING FINGER (Left) REPAIR EXTENSOR TENDON LEFT WITH DEBRIDMENT PROXIMAL INTERPHALANGEAL JOINT (Left)  Anesthesia type: general  Patient location: PACU  Post pain: Pain level controlled  Post assessment: Patient's Cardiovascular Status Stable  Last Vitals:  Filed Vitals:   07/07/13 1156  BP: 131/81  Pulse: 87  Temp: 36.8 C  Resp: 16    Post vital signs: Reviewed and stable  Level of consciousness: sedated  Complications: No apparent anesthesia complications

## 2013-07-07 NOTE — Anesthesia Procedure Notes (Addendum)
Anesthesia Regional Block:  Supraclavicular block  Pre-Anesthetic Checklist: ,, timeout performed, Correct Patient, Correct Site, Correct Laterality, Correct Procedure, Correct Position, site marked, Risks and benefits discussed,  Surgical consent,  Pre-op evaluation,  At surgeon's request and post-op pain management  Laterality: Left  Prep: chloraprep       Needles:  Injection technique: Single-shot  Needle Type: Echogenic Stimulator Needle     Needle Length: 9cm  Needle Gauge: 21 and 21 G    Additional Needles:  Procedures: ultrasound guided (picture in chart) and nerve stimulator Supraclavicular block  Nerve Stimulator or Paresthesia:  Response: 0.4 mA,   Additional Responses:   Narrative:  Start time: 07/07/2013 9:30 AM End time: 07/07/2013 9:41 AM Injection made incrementally with aspirations every 5 mL.  Performed by: Personally  Anesthesiologist: Arta Bruce MD  Additional Notes: Monitors applied. Patient sedated. Sterile prep and drape,hand hygiene and sterile gloves were used. Relevant anatomy identified.Needle position confirmed.Local anesthetic injected incrementally after negative aspiration. Local anesthetic spread visualized around nerve(s). Vascular puncture avoided. No complications. Image printed for medical record.The patient tolerated the procedure well.        Procedure Name: LMA Insertion Date/Time: 07/07/2013 10:12 AM Performed by: Nicki Reaper Pre-anesthesia Checklist: Patient identified, Emergency Drugs available, Suction available and Patient being monitored Patient Re-evaluated:Patient Re-evaluated prior to inductionOxygen Delivery Method: Circle System Utilized Preoxygenation: Pre-oxygenation with 100% oxygen Intubation Type: IV induction Ventilation: Mask ventilation without difficulty LMA: LMA inserted LMA Size: 4.0 Number of attempts: 1 Airway Equipment and Method: bite block Placement Confirmation: positive ETCO2 and breath sounds  checked- equal and bilateral Tube secured with: Tape Dental Injury: Teeth and Oropharynx as per pre-operative assessment

## 2013-07-07 NOTE — Anesthesia Preprocedure Evaluation (Signed)
Anesthesia Evaluation  Patient identified by MRN, date of birth, ID band Patient awake    Reviewed: Allergy & Precautions, H&P , NPO status , Patient's Chart, lab work & pertinent test results  Airway Mallampati: I TM Distance: >3 FB Neck ROM: Full    Dental   Pulmonary asthma ,          Cardiovascular hypertension, Pt. on medications + dysrhythmias Supra Ventricular Tachycardia     Neuro/Psych    GI/Hepatic GERD-  Medicated and Controlled,  Endo/Other    Renal/GU      Musculoskeletal   Abdominal   Peds  Hematology   Anesthesia Other Findings   Reproductive/Obstetrics                           Anesthesia Physical Anesthesia Plan  ASA: II  Anesthesia Plan: General   Post-op Pain Management:    Induction: Intravenous  Airway Management Planned:   Additional Equipment:   Intra-op Plan:   Post-operative Plan: Extubation in OR  Informed Consent: I have reviewed the patients History and Physical, chart, labs and discussed the procedure including the risks, benefits and alternatives for the proposed anesthesia with the patient or authorized representative who has indicated his/her understanding and acceptance.     Plan Discussed with: CRNA and Surgeon  Anesthesia Plan Comments:         Anesthesia Quick Evaluation

## 2013-07-07 NOTE — Transfer of Care (Signed)
Immediate Anesthesia Transfer of Care Note  Patient: Tracy Landry  Procedure(s) Performed: Procedure(s): EXCISION MASS LEFT RING FINGER (Left) REPAIR EXTENSOR TENDON LEFT WITH DEBRIDMENT PROXIMAL INTERPHALANGEAL JOINT (Left)  Patient Location: PACU  Anesthesia Type:GA combined with regional for post-op pain  Level of Consciousness: awake, alert  and oriented  Airway & Oxygen Therapy: Patient Spontanous Breathing and Patient connected to face mask oxygen  Post-op Assessment: Report given to PACU RN, Post -op Vital signs reviewed and stable and Patient moving all extremities  Post vital signs: Reviewed and stable  Complications: No apparent anesthesia complications

## 2013-07-07 NOTE — H&P (Signed)
Tracy Landry is a 52 year old right hand dominant former patient who comes in complaining of progressive pain in her right wrist. She localized this over the ulnar aspect. She is also complaining of a mass over the dorsal aspect of her left ring finger following a laceration 3 months ago. She states that the mass has gradually increased in size and is painful to touch. She has no prior history of injury. She is s/p reconstruction of her wrist with TFCC repair on her right side. She has not taken anything for this. She complains of an intermittent, moderate sharp pain and occasional popping. She states it is getting worse. Activity and work makes this worse. She has been taking Tylenol. She did not seek any treatment for her left ring finger injury. This is directly over the dorsal ulnar aspect of the PIP joint.  PAST MEDICAL HISTORY: She is allergic to Codeine. She is on Dexilant, Effexor, Losartan, Montelukast, Diclofenac, and Cyclobenzaprine. She has had the arm wrist surgery in 2002, hysterectomy in 2008, appendectomy in 1970, heart ablation in 2013, foot surgery in 2000.  FAMILY H ISTORY: Positive for diabetes, high BP.  SOCIAL HISTORY: She does not smoke. She drinks socially. She is married and a Administrator.  REVIEW OF SYSTEMS: Positive for glasses, ringing in her ears, asthma, pneumonia, otherwise negative for 14 points. Tracy Landry is an 52 y.o. female.   Chief Complaint: Mass left ring finger extensor tendon old  HPI: see above  Past Medical History  Diagnosis Date  . GERD (gastroesophageal reflux disease)   . Asthma   . Depression   . Back pain   . Hypertension   . 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  . Wears glasses     Past Surgical History  Procedure Laterality Date  . Abdominal hysterectomy  3/09  . Right arm reconstruction  1/01  . Foot surgery  2000    rt foot fx  . Appendectomy    . Ablation of dysrhythmic focus  10/07/2012   SVT  . Hardware removal  2003    rt arm    Family History  Problem Relation Age of Onset  . Alcohol abuse Mother   . Diabetes Mother   . Hyperlipidemia Mother   . Hypertension Mother   . Alzheimer's disease Father   . Diabetes Sister   . Hyperlipidemia Sister   . Hypertension Sister   . Heart attack Maternal Uncle   . Cancer Maternal Grandmother     lung  . Heart attack Maternal Grandfather   . Heart disease Mother     Rheumatic disease   Social History:  reports that she has never smoked. She has never used smokeless tobacco. She reports that she drinks alcohol. She reports that she does not use illicit drugs.  Allergies:  Allergies  Allergen Reactions  . Codeine Sulfate Itching    No prescriptions prior to admission    No results found for this or any previous visit (from the past 48 hour(s)).  No results found.   Pertinent items are noted in HPI.  Height 5\' 5"  (1.651 m), weight 212 lb (96.163 kg), last menstrual period 08/05/2006.  General appearance: alert, cooperative and appears stated age Head: Normocephalic, without obvious abnormality Neck: no JVD Resp: clear to auscultation bilaterally Cardio: regular rate and rhythm, S1, S2 normal, no murmur, click, rub or gallop GI: soft, non-tender; bowel sounds normal; no masses,  no organomegaly Extremities: extremities normal,  atraumatic, no cyanosis or edema Pulses: 2+ and symmetric Skin: Skin color, texture, turgor normal. No rashes or lesions Neurologic: Grossly normal Incision/Wound: na  Assessment/Plan She would like to have the mass on her ring finger removed with possible repair extensor tendon, debridement PIP joint. This will be scheduled as an outpatient under regional anesthesia for excision mass, debridement of PIP, repair extensor tendon as dictated by findings left ring finger.  Haelie Clapp R 07/07/2013, 8:30 AM

## 2013-07-07 NOTE — Progress Notes (Signed)
Assisted Dr. Ossey with left, ultrasound guided, supraclavicular block. Side rails up, monitors on throughout procedure. See vital signs in flow sheet. Tolerated Procedure well. 

## 2013-07-08 ENCOUNTER — Other Ambulatory Visit: Payer: Self-pay | Admitting: Sports Medicine

## 2013-07-08 ENCOUNTER — Other Ambulatory Visit: Payer: Self-pay | Admitting: Family Medicine

## 2013-07-08 ENCOUNTER — Encounter: Payer: Self-pay | Admitting: Physician Assistant

## 2013-07-08 DIAGNOSIS — B9689 Other specified bacterial agents as the cause of diseases classified elsewhere: Secondary | ICD-10-CM

## 2013-07-08 DIAGNOSIS — J329 Chronic sinusitis, unspecified: Secondary | ICD-10-CM

## 2013-07-08 MED ORDER — AMOXICILLIN-POT CLAVULANATE 500-125 MG PO TABS: ORAL_TABLET | ORAL | Status: AC

## 2013-07-08 NOTE — Op Note (Signed)
NAMEAIBHLINN, KALMAR              ACCOUNT NO.:  [id number]  MEDICAL RECORD NO.:  [id number]  LOCATION:                                 FACILITY:  PHYSICIAN:  Cindee Salt, M.D.            DATE OF BIRTH:  DATE OF PROCEDURE:  07/07/2013 DATE OF DISCHARGE:                              OPERATIVE REPORT   PREOPERATIVE DIAGNOSIS:  Mass over the dorsal aspect proximal interphalangeal joint of left ring finger.  POSTOPERATIVE DIAGNOSIS:  Mass over the dorsal aspect proximal interphalangeal joint of left ring finger.  OPERATION:  Excision cyst debridement of laceration extensor tendon with repair, left ring finger.  SURGEON:  Cindee Salt, MD  ANESTHESIA:  Axillary block with additional metacarpal block.  ANESTHESIOLOGIST:  Kaylyn Layer. Michelle Piper, MD  HISTORY:  The patient is a 52 year old female who suffered a laceration over the PIP joint of her left ring finger.  She has had an enlarging mass since the injury which occurred approximately 3 months ago.  She is desirous of having this explored, repaired, removed.  Pre, peri, postoperative course have been discussed along with risks and complications.  She is aware of the possibility of recurrence of the mass, infection, injury to arteries and nerves, possibility of repair and debridement of the joint with repair of the extensor tendon.  In the preoperative area, the patient is seen, the extremity marked by both patient and surgeon.  Antibiotic given.  PROCEDURE IN DETAIL:  The patient was brought to the operating room where an axillary block was carried out without difficulty under the direction of Dr. Michelle Piper.  She was sedated.  The limb exsanguinated with an Esmarch bandage after prepped and draped using ChloraPrep.  A 3- minute dry time was allowed.  Time-out taken confirming patient and procedure.  On making the incision, she still had some residual discomfort.  A metacarpal block was given with 0.25% Marcaine without epinephrine  approximately 6 mL was used.  The incision was deepened down to the extensor tendon.  A scar tissue was present over the tendon.  A cyst present, this was followed down into the joint, this was excised. The wound was then irrigated.  The tendon repaired with figure-of-eight 5-0 Mersilene sutures.  The skin closed with interrupted 4-0 Vicryl Rapide.  A sterile compressive dressing and splint to the finger applied with the PIP joint in extension.  On deflation of the tourniquet, all fingers immediately pinked.  She was taken to the recovery room for observation in satisfactory condition.  She will be discharged home to return in 1 week on Norco.          ______________________________ Cindee Salt, M.D.     GK/MEDQ  D:  07/07/2013  T:  07/08/2013  Job:  [id number]

## 2013-07-09 ENCOUNTER — Other Ambulatory Visit: Payer: Self-pay | Admitting: Physician Assistant

## 2013-07-09 MED ORDER — DEXLANSOPRAZOLE 60 MG PO CPDR: 60.0000 mg | DELAYED_RELEASE_CAPSULE | Freq: Every morning | ORAL | Status: DC

## 2013-07-10 ENCOUNTER — Other Ambulatory Visit: Payer: Self-pay | Admitting: Physician Assistant

## 2013-07-12 ENCOUNTER — Encounter (HOSPITAL_BASED_OUTPATIENT_CLINIC_OR_DEPARTMENT_OTHER): Payer: Self-pay | Admitting: Orthopedic Surgery

## 2013-08-17 ENCOUNTER — Other Ambulatory Visit: Payer: Self-pay | Admitting: Family Medicine

## 2013-08-29 ENCOUNTER — Other Ambulatory Visit: Payer: Self-pay | Admitting: Physician Assistant

## 2013-08-30 NOTE — Telephone Encounter (Signed)
Are you ok to fill this?

## 2013-09-09 ENCOUNTER — Encounter: Payer: Self-pay | Admitting: Physician Assistant

## 2013-09-10 ENCOUNTER — Other Ambulatory Visit: Payer: Self-pay | Admitting: *Deleted

## 2013-09-10 MED ORDER — ALBUTEROL SULFATE HFA 108 (90 BASE) MCG/ACT IN AERS: 2.0000 | INHALATION_SPRAY | Freq: Four times a day (QID) | RESPIRATORY_TRACT | Status: DC | PRN

## 2013-09-27 ENCOUNTER — Other Ambulatory Visit: Payer: Self-pay | Admitting: Physician Assistant

## 2013-09-29 ENCOUNTER — Ambulatory Visit: Payer: BC Managed Care – PPO | Admitting: Physician Assistant

## 2013-09-30 ENCOUNTER — Other Ambulatory Visit: Payer: Self-pay | Admitting: Physician Assistant

## 2013-10-08 ENCOUNTER — Ambulatory Visit (INDEPENDENT_AMBULATORY_CARE_PROVIDER_SITE_OTHER): Payer: BC Managed Care – PPO | Admitting: Physician Assistant

## 2013-10-08 ENCOUNTER — Encounter: Payer: Self-pay | Admitting: Physician Assistant

## 2013-10-08 VITALS — BP 114/71 | HR 97 | Temp 97.9°F | Wt 225.0 lb

## 2013-10-08 DIAGNOSIS — J019 Acute sinusitis, unspecified: Secondary | ICD-10-CM

## 2013-10-08 DIAGNOSIS — J209 Acute bronchitis, unspecified: Secondary | ICD-10-CM

## 2013-10-08 MED ORDER — HYDROCODONE-HOMATROPINE 5-1.5 MG/5ML PO SYRP: 5.0000 mL | ORAL_SOLUTION | Freq: Every evening | ORAL | Status: DC | PRN

## 2013-10-08 MED ORDER — AZITHROMYCIN 250 MG PO TABS: ORAL_TABLET | ORAL | Status: DC

## 2013-10-08 MED ORDER — METHYLPREDNISOLONE SODIUM SUCC 125 MG IJ SOLR
125.0000 mg | Freq: Once | INTRAMUSCULAR | Status: AC
  Administered 2013-10-08: 125 mg via INTRAMUSCULAR

## 2013-10-08 NOTE — Progress Notes (Signed)
   Subjective:    Patient ID: Tracy Landry, female    DOB: Jan 06, 1961, 53 y.o.   MRN: [id number]  HPI Patient is a 53 year old female who presents to the clinic with her partner chief complaint of coughing, fever, chest congestion, sinus pressure. She's had symptoms for the last 3 weeks. They've continued to worsen. The most concerning symptom is her productive cough with green sputum. She's tried multiple over-the-counter options with Alka-Seltzer, Mucinex, Delsym and Robitussin. She's not had any relief. She does admit to wheezing, and having to use her inhaler. She denies any nausea, vomiting , sore throat or ear pain. Her temperature has gone to around 99 or 100.    Review of Systems     Objective:   Physical Exam  Constitutional: She appears well-developed and well-nourished.  HENT:  Head: Normocephalic and atraumatic.  Right Ear: External ear normal.  Left Ear: External ear normal.  Nose: Nose normal.  Mouth/Throat: Oropharynx is clear and moist.  TMs. Injected but clear.  Maxillary and frontal sinus tenderness to palpation.  Eyes: Conjunctivae are normal. Right eye exhibits no discharge. Left eye exhibits no discharge.  Neck: Normal range of motion. Neck supple.  Cardiovascular: Normal rate, regular rhythm and normal heart sounds.   Pulmonary/Chest:  Hacking productive cough throughout entire exam. A few expiratory wheezes heard at the base of both lungs. Deep breathing produced significant coughing and wheezing.  Lymphadenopathy:    She has no cervical adenopathy.  Skin:  Flushed cheeks bilaterally.  Psychiatric: She has a normal mood and affect. Her behavior is normal.          Assessment & Plan:  Sinusitis/bronchitis-treated with zpak, Solu-Medrol 125 mg IM was given in office today, Hycodan was printed out for patient to use for cough at night. Encouraged patient to cough during the day. Gave handout for symptomatic care. Rest and stay hydrated. Call if not  improving.

## 2013-10-08 NOTE — Patient Instructions (Signed)

## 2013-10-15 ENCOUNTER — Ambulatory Visit (INDEPENDENT_AMBULATORY_CARE_PROVIDER_SITE_OTHER): Payer: BC Managed Care – PPO | Admitting: Physician Assistant

## 2013-10-15 ENCOUNTER — Encounter: Payer: Self-pay | Admitting: Physician Assistant

## 2013-10-15 VITALS — BP 118/76 | HR 90 | Temp 98.1°F | Wt 220.0 lb

## 2013-10-15 DIAGNOSIS — J45909 Unspecified asthma, uncomplicated: Secondary | ICD-10-CM

## 2013-10-15 DIAGNOSIS — R05 Cough: Secondary | ICD-10-CM

## 2013-10-15 DIAGNOSIS — R059 Cough, unspecified: Secondary | ICD-10-CM

## 2013-10-15 MED ORDER — HYDROCOD POLST-CHLORPHEN POLST 10-8 MG/5ML PO LQCR: 5.0000 mL | Freq: Two times a day (BID) | ORAL | Status: DC | PRN

## 2013-10-15 MED ORDER — PREDNISONE 20 MG PO TABS: ORAL_TABLET | ORAL | Status: DC

## 2013-10-15 MED ORDER — METHYLPREDNISOLONE SODIUM SUCC 125 MG IJ SOLR
125.0000 mg | Freq: Once | INTRAMUSCULAR | Status: AC
  Administered 2013-10-15: 125 mg via INTRAMUSCULAR

## 2013-10-15 MED ORDER — ALBUTEROL SULFATE (2.5 MG/3ML) 0.083% IN NEBU
2.5000 mg | INHALATION_SOLUTION | Freq: Once | RESPIRATORY_TRACT | Status: AC
  Administered 2013-10-15: 2.5 mg via RESPIRATORY_TRACT

## 2013-10-15 NOTE — Patient Instructions (Signed)
Asthma, Acute Bronchospasm Acute bronchospasm caused by asthma is also referred to as an asthma attack. Bronchospasm means your air passages become narrowed. The narrowing is caused by inflammation and tightening of the muscles in the air tubes (bronchi) in your lungs. This can make it hard to breath or cause you to wheeze and cough. CAUSES Possible triggers are:  Animal dander from the skin, hair, or feathers of animals.  Dust mites contained in house dust.  Cockroaches.  Pollen from trees or grass.  Mold.  Cigarette or tobacco smoke.  Air pollutants such as dust, household cleaners, hair sprays, aerosol sprays, paint fumes, strong chemicals, or strong odors.  Cold air or weather changes. Cold air may trigger inflammation. Winds increase molds and pollens in the air.  Strong emotions such as crying or laughing hard.  Stress.  Certain medicines such as aspirin or beta-blockers.  Sulfites in foods and drinks, such as dried fruits and wine.  Infections or inflammatory conditions, such as a flu, cold, or inflammation of the nasal membranes (rhinitis).  Gastroesophageal reflux disease (GERD). GERD is a condition where stomach acid backs up into your throat (esophagus).  Exercise or strenuous activity. SIGNS AND SYMPTOMS   Wheezing.  Excessive coughing, particularly at night.  Chest tightness.  Shortness of breath. DIAGNOSIS  Your health care provider will ask you about your medical history and perform a physical exam. A chest X-ray or blood testing may be performed to look for other causes of your symptoms or other conditions that may have triggered your asthma attack. TREATMENT  Treatment is aimed at reducing inflammation and opening up the airways in your lungs. Most asthma attacks are treated with inhaled medicines. These include quick relief or rescue medicines (such as bronchodilators) and controller medicines (such as inhaled corticosteroids). These medicines are  sometimes given through an inhaler or a nebulizer. Systemic steroid medicine taken by mouth or given through an IV tube also can be used to reduce the inflammation when an attack is moderate or severe. Antibiotic medicines are only used if a bacterial infection is present.  HOME CARE INSTRUCTIONS   Rest.  Drink plenty of liquids. This helps the mucus to remain thin and be easily coughed up. Only use caffeine in moderation and do not use alcohol until you have recovered from your illness.  Do not smoke. Avoid being exposed to secondhand smoke.  You play a critical role in keeping yourself in good health. Avoid exposure to things that cause you to wheeze or to have breathing problems.  Keep your medicines up to date and available. Carefully follow your health care provider's treatment plan.  Take your medicine exactly as prescribed.  When pollen or pollution is bad, keep windows closed and use an air conditioner or go to places with air conditioning.  Asthma requires careful medical care. See your health care provider for a follow-up as advised. If you are more than [redacted] weeks pregnant and you were prescribed any new medicines, let your obstetrician know about the visit and how you are doing. Follow-up with your health care provider as directed.  After you have recovered from your asthma attack, make an appointment with your outpatient doctor to talk about ways to reduce the likelihood of future attacks. If you do not have a doctor who manages your asthma, make an appointment with a primary care doctor to discuss your asthma. SEEK IMMEDIATE MEDICAL CARE IF:   You are getting worse.  You have trouble breathing. If severe, call  your local emergency services (911 in the [place] °· You develop chest pain or discomfort. °· You are vomiting. °· You are not able to keep fluids down. °· You are coughing up yellow, green, brown, or bloody sputum. °· You have a fever and your symptoms suddenly get  worse. °· You have trouble swallowing. °MAKE SURE YOU:  °· Understand these instructions. °· Will watch your condition. °· Will get help right away if you are not doing well or get worse. °Document Released: 11/06/2006 Document Revised: 03/24/2013 Document Reviewed: 01/27/2013 °[employer]® Patient Information ©2014 [employer]. °Bronchitis °Bronchitis is inflammation of the airways that extend from the windpipe into the lungs (bronchi). The inflammation often causes mucus to develop, which leads to a cough. If the inflammation becomes severe, it may cause shortness of breath. °CAUSES  °Bronchitis may be caused by:  °· Viral infections.   °· Bacteria.   °· Cigarette smoke.   °· Allergens, pollutants, and other irritants.   °SIGNS AND SYMPTOMS  °The most common symptom of bronchitis is a frequent cough that produces mucus. Other symptoms include: °· Fever.   °· Body aches.   °· Chest congestion.   °· Chills.   °· Shortness of breath.   °· Sore throat.   °DIAGNOSIS  °Bronchitis is usually diagnosed through a medical history and physical exam. Tests, such as chest X-rays, are sometimes done to rule out other conditions.  °TREATMENT  °You may need to avoid contact with whatever caused the problem (smoking, for example). Medicines are sometimes needed. These may include: °· Antibiotics. These may be prescribed if the condition is caused by bacteria. °· Cough suppressants. These may be prescribed for relief of cough symptoms.   °· Inhaled medicines. These may be prescribed to help open your airways and make it easier for you to breathe.   °· Steroid medicines. These may be prescribed for those with recurrent (chronic) bronchitis. °HOME CARE INSTRUCTIONS °· Get plenty of rest.   °· Drink enough fluids to keep your urine clear or pale yellow (unless you have a medical condition that requires fluid restriction). Increasing fluids may help thin your secretions and will prevent dehydration.   °· Only take over-the-counter or  prescription medicines as directed by your health care provider. °· Only take antibiotics as directed. Make sure you finish them even if you start to feel better. °· Avoid secondhand smoke, irritating chemicals, and strong fumes. These will make bronchitis worse. If you are a smoker, quit smoking. Consider using nicotine gum or skin patches to help control withdrawal symptoms. Quitting smoking will help your lungs heal faster.   °· Put a cool-mist humidifier in your bedroom at night to moisten the air. This may help loosen mucus. Change the water in the humidifier daily. You can also run the hot water in your shower and sit in the bathroom with the door closed for 5 10 minutes.   °· Follow up with your health care provider as directed.   °· Wash your hands frequently to avoid catching bronchitis again or spreading an infection to others.   °SEEK MEDICAL CARE IF: °Your symptoms do not improve after 1 week of treatment.  °SEEK IMMEDIATE MEDICAL CARE IF: °· Your fever increases. °· You have chills.   °· You have chest pain.   °· You have worsening shortness of breath.   °· You have bloody sputum. °· You faint.   °· You have lightheadedness. °· You have a severe headache.   °· You vomit repeatedly. °MAKE SURE YOU:  °· Understand these instructions. °· Will watch your condition. °· Will get help right away if you   are not doing well or get worse. °Document Released: 07/22/2005 Document Revised: 05/12/2013 Document Reviewed: 03/16/2013 °Exit[employer]® Patient Information ©2014 [employer]. ° °

## 2013-10-17 NOTE — Progress Notes (Signed)
   Subjective:    Patient ID: Tracy Landry, female    DOB: Apr 07, 1961, 53 y.o.   MRN: [id number]  HPI Pt was seen last week for asthmatic bronchitis and given steroid shot and abx. Cough and wheezing has now worsened. Hycodan does help. Even with that she continues to cough. Denies any fever, chills, vomiting, body aches, sinus pressure, ear pain. She has hx of wheezing but per pt's ruled out she had asthma. Taking zyrtec and singulair daily.    Review of Systems     Objective:   Physical Exam  Constitutional: She is oriented to person, place, and time. She appears well-developed and well-nourished.  HENT:  Head: Normocephalic and atraumatic.  Right Ear: External ear normal.  Left Ear: External ear normal.  Nose: Nose normal.  Mouth/Throat: Oropharynx is clear and moist.  Eyes: Conjunctivae are normal. Right eye exhibits no discharge. Left eye exhibits no discharge.  Neck: Normal range of motion. Neck supple.  Cardiovascular: Normal rate, regular rhythm and normal heart sounds.   Pulmonary/Chest:  Dry hacking cough throughout entire encounter. Not able to talk without coughing. Expiratory wheezing heard bilaterally.   Lymphadenopathy:    She has no cervical adenopathy.  Neurological: She is alert and oriented to person, place, and time.  Skin: Skin is dry.  Psychiatric: She has a normal mood and affect. Her behavior is normal.          Assessment & Plan:  Asthmatic bronchitis/reactive airway disease/cough- albuterol nebulizer in office today. shot of solumedrol 125mg  IM. Oral steroid burst given. Encouraged albuterol inhaler every 2-4 hours. Gave tussinonex for cough at bedtime. Discussed short term ICS;we will and see if improvement occurs in next couple of days. Continue on singulair and zyrtec.

## 2013-10-20 ENCOUNTER — Ambulatory Visit (INDEPENDENT_AMBULATORY_CARE_PROVIDER_SITE_OTHER): Payer: BC Managed Care – PPO | Admitting: Physician Assistant

## 2013-10-20 DIAGNOSIS — Z2839 Other underimmunization status: Secondary | ICD-10-CM

## 2013-10-20 DIAGNOSIS — Z283 Underimmunization status: Secondary | ICD-10-CM

## 2013-10-20 DIAGNOSIS — Z23 Encounter for immunization: Secondary | ICD-10-CM

## 2013-10-20 NOTE — Progress Notes (Signed)
   Subjective:    Patient ID: Tracy Landry, female    DOB: Feb 02, 1961, 53 y.o.   MRN: [id number]  HPI   Pt is here for a TDAP injection Review of Systems     Objective:   Physical Exam        Assessment & Plan:  Given without complication

## 2013-10-29 ENCOUNTER — Other Ambulatory Visit: Payer: Self-pay | Admitting: Physician Assistant

## 2013-11-03 ENCOUNTER — Other Ambulatory Visit: Payer: Self-pay | Admitting: Physician Assistant

## 2013-11-05 ENCOUNTER — Other Ambulatory Visit: Payer: Self-pay | Admitting: Physician Assistant

## 2013-11-08 ENCOUNTER — Other Ambulatory Visit: Payer: Self-pay | Admitting: *Deleted

## 2013-11-08 MED ORDER — DICLOFENAC SODIUM 75 MG PO TBEC: DELAYED_RELEASE_TABLET | ORAL | Status: DC

## 2013-11-15 ENCOUNTER — Ambulatory Visit (INDEPENDENT_AMBULATORY_CARE_PROVIDER_SITE_OTHER): Payer: BC Managed Care – PPO | Admitting: Physician Assistant

## 2013-11-15 ENCOUNTER — Encounter: Payer: Self-pay | Admitting: Physician Assistant

## 2013-11-15 ENCOUNTER — Other Ambulatory Visit: Payer: Self-pay | Admitting: Physician Assistant

## 2013-11-15 VITALS — BP 130/90 | HR 99 | Ht 65.0 in | Wt 221.0 lb

## 2013-11-15 DIAGNOSIS — M2550 Pain in unspecified joint: Secondary | ICD-10-CM

## 2013-11-15 DIAGNOSIS — Z23 Encounter for immunization: Secondary | ICD-10-CM

## 2013-11-15 DIAGNOSIS — M719 Bursopathy, unspecified: Secondary | ICD-10-CM

## 2013-11-15 DIAGNOSIS — Z13220 Encounter for screening for lipoid disorders: Secondary | ICD-10-CM

## 2013-11-15 DIAGNOSIS — R5383 Other fatigue: Secondary | ICD-10-CM

## 2013-11-15 DIAGNOSIS — M67919 Unspecified disorder of synovium and tendon, unspecified shoulder: Secondary | ICD-10-CM

## 2013-11-15 DIAGNOSIS — M7551 Bursitis of right shoulder: Secondary | ICD-10-CM

## 2013-11-15 DIAGNOSIS — R5381 Other malaise: Secondary | ICD-10-CM

## 2013-11-15 DIAGNOSIS — R42 Dizziness and giddiness: Secondary | ICD-10-CM

## 2013-11-15 DIAGNOSIS — Z0000 Encounter for general adult medical examination without abnormal findings: Secondary | ICD-10-CM

## 2013-11-15 DIAGNOSIS — Z131 Encounter for screening for diabetes mellitus: Secondary | ICD-10-CM

## 2013-11-15 NOTE — Progress Notes (Signed)
Subjective:     Tracy Landry is a 53 y.o. female and is here for a comprehensive physical exam. The patient reports problems - multiple ongoing problems she is concerned with.  Patient and partner are present today. The last couple months she has multiple disconnected concerns. She feels very tired and fatigued. Last Friday she was so fatigued and worn out she started shaking and crying because she hated the way she felt. She will wake up with headaches that are dull and persistent. She has pain in her right shoulder, left elbow, bilateral knees and lower back. She does carry the diagnosis of ankylosing spondylitis. She feels very dizzy and lightheaded sometimes. 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 Her ears are always ringing that she frequently feels nausea when she is busy. She has a weird sensation of maple syrup of smell for the last couple months happen all of a sudden she just smells it. Her eyes. And tired. She works in [employer] and has a very physical job. She denies any shortness of breath, wheezing, hearing loss. Denies any vision changes. She is alert there is a disconnect with her vision in her head.  History   Social History  . Marital Status: Married    Spouse Name: N/A    Number of Children: N/A  . 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 Smoking status: Never Smoker   . Smokeless tobacco: Never Used  . Alcohol Use: 0.0 oz/week     Comment: 1-2 glasses wine per night  . Drug Use: No  . Sexual Activity: Yes    Partners: Female   Other Topics Concern  . Not on file   Social History Narrative  . No narrative on file   Health Maintenance  Topic Date Due  . Pneumococcal Polysaccharide Vaccine (##1) 07/13/1964  . Influenza Vaccine  03/05/2014  . Mammogram  04/20/2014  . Colonoscopy  05/08/2022  . Tetanus/tdap  10/21/2023    The following portions of the patient's  history were reviewed and updated as appropriate: allergies, current medications, past family history, past medical history, past social history, past surgical history and problem list.  Review of Systems A comprehensive review of systems was negative.   Objective:    BP 130/90  Pulse 99  Ht 5\' 5"  (1.651 m)  Wt 221 lb (100.245 kg)  BMI 36.78 kg/m2  LMP 08/05/2006 General appearance: alert, cooperative, appears stated age and mildly obese Head: Normocephalic, without obvious abnormality, atraumatic Eyes: conjunctivae/corneas clear. PERRL, EOM's intact. Fundi benign. Ears: normal TM's and external ear canals both ears Nose: Nares normal. Septum midline. Mucosa normal. No drainage or sinus tenderness. Throat: lips, mucosa, and tongue normal; teeth and gums normal Neck: no adenopathy, no carotid bruit, no JVD, supple, symmetrical, trachea midline and thyroid not enlarged, symmetric, no tenderness/mass/nodules Back: symmetric, no curvature. ROM normal. No CVA tenderness. Lungs: clear to auscultation bilaterally Heart: regular rate and rhythm, S1, S2 normal, no murmur, click, rub or gallop Abdomen: soft, non-tender; bowel sounds normal; no masses,  no organomegaly Extremities: extremities normal, atraumatic, no cyanosis or edema ROM limited due to pain with abduction to 120 degrees. Pain with palpation posteriorly and anteriorly around acromion. Strength 4/5 right arm.  Pulses: 2+ and symmetric Skin: Skin color, texture, turgor normal. No rashes or lesions Lymph nodes: Cervical, supraclavicular, and axillary nodes normal. Neurologic:  Grossly normal    Assessment:    Healthy female exam.      Plan:    CPE- Prevnar was given today without complications due to high risk. Will get screening labs. Mammogram up to date. Hysterectomy(no need for pap smear). colonscopy up to date. Pt has advanced directives. Will email copy to place in chart. Depression 0/2. Discussed regular exercise  148minutes a week. Reccommended calcium 1200mg  and vitamin D 800units.   Dizziness/tinnitis/nausea- orthostatics were negative. would like for pt to be evaluated for menieres disease. Will send to ENT. Will order labs for other metabolic causes.   Pain multiple joints/fatigue/right shoulder pain/strength decreases/Fatigue- pt concerned something bigger is going on. Will certainly evaluate for other autoimmune diseases as well as as RA, Thyroid issues etc. However pt does lead a very physical job and could be some osteoarthritis/overuse of some joints. Will continue to monitor.   Right shoulder bursitis exam and HPI seem consistent. Injection given today. Diclofenac helps but causes GI upset so cannot take regularly. I would like pt to try cream on affected areas up to three times a day to see if helps. Denies norco for pain control. Exercises given. Could consider PT. Follow up in 1 months.   Shoulder Injection Procedure Note  Pre-operative Diagnosis: right shoulder bursitis  Post-operative Diagnosis: same  Indications: pain  Anesthesia: ethyl chloride Procedure Details   Verbal consent was obtained for the procedure. The shoulder was prepped with iodine and the skin was anesthetized. Using a 22 gauge needle the glenohumeral joint is injected with 9 mL 1% lidocaine and 1 mL of depo medrol 40mg  under the posterior aspect of the acromion. The injection site was cleansed with topical isopropyl alcohol and a dressing was applied.  Complications:  None; patient tolerated the procedure well.  See After Visit Summary for Counseling Recommendations

## 2013-11-15 NOTE — Patient Instructions (Signed)

## 2013-11-15 NOTE — Addendum Note (Signed)
Addended by: Beatris Ship L on: 11/15/2013 01:13 PM   Modules accepted: Orders

## 2013-11-16 ENCOUNTER — Other Ambulatory Visit: Payer: Self-pay | Admitting: *Deleted

## 2013-11-16 LAB — COMPREHENSIVE METABOLIC PANEL
ALT: 25 U/L (ref 0–35)
AST: 21 U/L (ref 0–37)
Albumin: 4.3 g/dL (ref 3.5–5.2)
Alkaline Phosphatase: 62 U/L (ref 39–117)
BUN: 9 mg/dL (ref 6–23)
CALCIUM: 9.8 mg/dL (ref 8.4–10.5)
CHLORIDE: 101 meq/L (ref 96–112)
CO2: 29 meq/L (ref 19–32)
CREATININE: 0.68 mg/dL (ref 0.50–1.10)
Glucose, Bld: 109 mg/dL — ABNORMAL HIGH (ref 70–99)
Potassium: 4.1 mEq/L (ref 3.5–5.3)
Sodium: 140 mEq/L (ref 135–145)
Total Bilirubin: 0.3 mg/dL (ref 0.2–1.2)
Total Protein: 7.2 g/dL (ref 6.0–8.3)

## 2013-11-16 LAB — CBC WITH DIFFERENTIAL/PLATELET
Basophils Absolute: 0 10*3/uL (ref 0.0–0.1)
Basophils Relative: 0 % (ref 0–1)
EOS ABS: 0.1 10*3/uL (ref 0.0–0.7)
EOS PCT: 2 % (ref 0–5)
HEMATOCRIT: 39.9 % (ref 36.0–46.0)
HEMOGLOBIN: 13.7 g/dL (ref 12.0–15.0)
LYMPHS PCT: 35 % (ref 12–46)
Lymphs Abs: 2.6 10*3/uL (ref 0.7–4.0)
MCH: 31 pg (ref 26.0–34.0)
MCHC: 34.3 g/dL (ref 30.0–36.0)
MCV: 90.3 fL (ref 78.0–100.0)
MONO ABS: 0.5 10*3/uL (ref 0.1–1.0)
MONOS PCT: 7 % (ref 3–12)
Neutro Abs: 4.1 10*3/uL (ref 1.7–7.7)
Neutrophils Relative %: 56 % (ref 43–77)
Platelets: 431 10*3/uL — ABNORMAL HIGH (ref 150–400)
RBC: 4.42 MIL/uL (ref 3.87–5.11)
RDW: 13.6 % (ref 11.5–15.5)
WBC: 7.3 10*3/uL (ref 4.0–10.5)

## 2013-11-16 LAB — CYCLIC CITRUL PEPTIDE ANTIBODY, IGG: Cyclic Citrullin Peptide Ab: <2 U/mL (ref 0.0–5.0)

## 2013-11-16 LAB — LIPID PANEL
CHOL/HDL RATIO: 4.4 ratio
Cholesterol: 288 mg/dL — ABNORMAL HIGH (ref 0–200)
HDL: 65 mg/dL (ref >39)
LDL Cholesterol: 183 mg/dL — ABNORMAL HIGH (ref 0–99)
Triglycerides: 199 mg/dL — ABNORMAL HIGH (ref <150)
VLDL: 40 mg/dL (ref 0–40)

## 2013-11-16 LAB — T4, FREE: FREE T4: 0.96 ng/dL (ref 0.80–1.80)

## 2013-11-16 LAB — SEDIMENTATION RATE: Sed Rate: 11 mm/hr (ref 0–22)

## 2013-11-16 LAB — RHEUMATOID FACTOR

## 2013-11-16 LAB — VITAMIN D 25 HYDROXY (VIT D DEFICIENCY, FRACTURES): Vit D, 25-Hydroxy: 42 ng/mL (ref 30–89)

## 2013-11-16 LAB — TSH: TSH: 2.089 u[IU]/mL (ref 0.350–4.500)

## 2013-11-16 LAB — C-REACTIVE PROTEIN: CRP: 1.1 mg/dL — ABNORMAL HIGH (ref <0.60)

## 2013-11-16 LAB — ANA: Anti Nuclear Antibody(ANA): NEGATIVE

## 2013-11-16 LAB — VITAMIN B12: Vitamin B-12: 601 pg/mL (ref 211–911)

## 2013-11-16 MED ORDER — AMBULATORY NON FORMULARY MEDICATION: Status: DC

## 2013-11-17 LAB — HEMOGLOBIN A1C
HEMOGLOBIN A1C: 5.6 % (ref <5.7)
MEAN PLASMA GLUCOSE: 114 mg/dL (ref <117)

## 2013-11-29 ENCOUNTER — Other Ambulatory Visit: Payer: Self-pay | Admitting: Physician Assistant

## 2013-12-01 ENCOUNTER — Other Ambulatory Visit: Payer: Self-pay | Admitting: *Deleted

## 2013-12-01 MED ORDER — CYCLOBENZAPRINE HCL 10 MG PO TABS: ORAL_TABLET | ORAL | Status: DC

## 2013-12-26 ENCOUNTER — Other Ambulatory Visit: Payer: Self-pay | Admitting: Physician Assistant

## 2013-12-29 ENCOUNTER — Other Ambulatory Visit: Payer: Self-pay | Admitting: *Deleted

## 2013-12-29 MED ORDER — CYCLOBENZAPRINE HCL 10 MG PO TABS: ORAL_TABLET | ORAL | Status: DC

## 2014-01-03 ENCOUNTER — Other Ambulatory Visit: Payer: Self-pay | Admitting: Physician Assistant

## 2014-01-27 ENCOUNTER — Other Ambulatory Visit: Payer: Self-pay | Admitting: Physician Assistant

## 2014-02-03 ENCOUNTER — Encounter: Payer: Self-pay | Admitting: Sports Medicine

## 2014-02-03 ENCOUNTER — Ambulatory Visit (INDEPENDENT_AMBULATORY_CARE_PROVIDER_SITE_OTHER): Payer: BC Managed Care – PPO

## 2014-02-03 ENCOUNTER — Ambulatory Visit (INDEPENDENT_AMBULATORY_CARE_PROVIDER_SITE_OTHER): Payer: BC Managed Care – PPO | Admitting: Sports Medicine

## 2014-02-03 VITALS — BP 128/78 | HR 102 | Ht 65.0 in | Wt 221.0 lb

## 2014-02-03 DIAGNOSIS — M19019 Primary osteoarthritis, unspecified shoulder: Secondary | ICD-10-CM

## 2014-02-03 DIAGNOSIS — M25519 Pain in unspecified shoulder: Secondary | ICD-10-CM

## 2014-02-03 DIAGNOSIS — M19011 Primary osteoarthritis, right shoulder: Secondary | ICD-10-CM | POA: Insufficient documentation

## 2014-02-03 DIAGNOSIS — M25511 Pain in right shoulder: Secondary | ICD-10-CM

## 2014-02-03 NOTE — Assessment & Plan Note (Addendum)
Symptoms are consistent with both right acromioclavicular joint osteoarthritis and subacromial bursitis. Injection placed into the acromioclavicular joint for diagnostic and therapeutic purposes. There was incomplete pain relief so I then placed another injection into the subacromial space, I did see both articular and bursal sided tears of the supraspinatus. She had better pain relief after the subacromial injection. Formal physical therapy. X-rays. Return in a month. I do think she has a 50/50 chance of proceeding to arthroscopy considering the appearance of her rotator cuff.

## 2014-02-03 NOTE — Progress Notes (Signed)
   Subjective:    I'm seeing Tracy Landry as a consultation for:  Iran Planas, PA-C  CC: Right shoulder pain  HPI: Tracy is a very pleasant 53 year old female, she has a history of adhesive capsulitis post manipulation under anesthesia. For the past several months she's had pain that she localizes over the deltoid, worse with overhead activities and worse with movement across her chest. Pain is moderate, persistent, no neck pain or radicular symptoms. She had an injection couple of months ago by Iran Planas, PA-C which provided some relief but the pain is back. She points to her acromioclavicular joint as the origin of her pain.  Past medical history, Surgical history, Family history not pertinant except as noted below, Social history, Allergies, and medications have been entered into the medical record, reviewed, and no changes needed.   Review of Systems: No headache, visual changes, nausea, vomiting, diarrhea, constipation, dizziness, abdominal pain, skin rash, fevers, chills, night sweats, weight loss, swollen lymph nodes, body aches, joint swelling, muscle aches, chest pain, shortness of breath, mood changes, visual or auditory hallucinations.   Objective:   General: Well Developed, well nourished, and in no acute distress.  Neuro/Psych: Alert and oriented x3, extra-ocular muscles intact, able to move all 4 extremities, sensation grossly intact. Skin: Warm and dry, no rashes noted.  Respiratory: Not using accessory muscles, speaking in full sentences, trachea midline.  Cardiovascular: Pulses palpable, no extremity edema. Abdomen: Does not appear distended. Right Shoulder: Inspection reveals no abnormalities, atrophy or asymmetry. Tender to palpation over the a.c. joint. Positive cross arm sign. ROM is full in all planes. Rotator cuff strength normal throughout. Positive Neer and Hawkin's tests, empty can sign. Speeds and Yergason's tests normal. No labral pathology noted with  negative Obrien's, negative clunk and good stability. Normal scapular function observed. No painful arc and no drop arm sign. No apprehension sign  Procedure: Real-time Ultrasound Guided Injection of right acromioclavicular joint Device: GE Logiq E  Verbal informed consent obtained.  Time-out conducted.  Noted no overlying erythema, induration, or other signs of local infection.  Skin prepped in a sterile fashion.  Local anesthesia: Topical Ethyl chloride.  With sterile technique and under real time ultrasound guidance:  0.5 cc Kenalog 40, 0.5 cc lidocaine injected easily. Completed without difficulty  Pain did not resolve suggesting that the a.c. joint is not the pain generator.  Advised to call if fevers/chills, erythema, induration, drainage, or persistent bleeding.  Images permanently stored and available for review in the ultrasound unit.  Impression: Technically successful ultrasound guided injection.  Procedure: Real-time Ultrasound Guided Injection of right subacromial bursa Device: GE Logiq E  Verbal informed consent obtained.  Time-out conducted.  Noted no overlying erythema, induration, or other signs of local infection.  Skin prepped in a sterile fashion.  Local anesthesia: Topical Ethyl chloride.  With sterile technique and under real time ultrasound guidance:  Noted both the bursal and articular sided tears of the supraspinatus, near full-thickness, 25-gauge needle advanced into the bursa, 1 cc Kenalog 40, 4 cc lidocaine injected easily. Completed without difficulty  Pain partially resolved suggesting accurate placement of the medication. Advised to call if fevers/chills, erythema, induration, drainage, or persistent bleeding.  Images permanently stored and available for review in the ultrasound unit.  Impression: Technically successful ultrasound guided injection.  Impression and Recommendations:   Tracy case required medical decision making of moderate complexity.

## 2014-03-03 ENCOUNTER — Ambulatory Visit: Payer: BC Managed Care – PPO | Admitting: Sports Medicine

## 2014-03-04 ENCOUNTER — Ambulatory Visit: Payer: BC Managed Care – PPO | Admitting: Sports Medicine

## 2014-03-22 ENCOUNTER — Other Ambulatory Visit: Payer: Self-pay | Admitting: Physician Assistant

## 2014-04-01 ENCOUNTER — Encounter: Payer: Self-pay | Admitting: Sports Medicine

## 2014-04-01 ENCOUNTER — Ambulatory Visit (INDEPENDENT_AMBULATORY_CARE_PROVIDER_SITE_OTHER): Payer: BC Managed Care – PPO | Admitting: Sports Medicine

## 2014-04-01 VITALS — BP 125/76 | HR 102 | Ht 64.0 in | Wt 218.0 lb

## 2014-04-01 DIAGNOSIS — M7750 Other enthesopathy of unspecified foot: Secondary | ICD-10-CM

## 2014-04-01 DIAGNOSIS — M7672 Peroneal tendinitis, left leg: Secondary | ICD-10-CM | POA: Insufficient documentation

## 2014-04-01 NOTE — Assessment & Plan Note (Signed)
Persistent despite NSAIDs. Peroneal tendon sheath injection as above, formal physical therapy. Return for custom orthotics. There is also an element of plantar fasciitis.

## 2014-04-01 NOTE — Progress Notes (Signed)
Patient ID: Tracy Landry, female   DOB: 06/28/1961, 53 y.o.   MRN: [id number]  Subjective:    CC: Left ankle and foot pain  HPI: Tracy Landry is a very pleasant 53 year old female with history of ankylosing spondylitis and childhood overpronation who presents with one week of left ankle and foot pain. Pain of the foot is worse in the morning and with walking. Pain of the ankle is worse with walking and with eversion. She has also noted some swelling of the lateral ankle. Currently on a daily regimen of Diclofenac for her ankylosing spondylitis and has been adding Tylenol for this new-onset foot/ankle pain, but Tylenol has been unsuccessful. Does report hearing a popping sound in the left ankle several times this week. 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  Past medical history, Surgical history, Family history not pertinant except as noted below, Social history, Allergies, and medications have been entered into the medical record, reviewed, and no changes needed.   Review of Systems: No fevers, chills, night sweats, weight loss, chest pain, or shortness of breath.   Objective:    General: Well Developed, well nourished, and in no acute distress.  Neuro: Alert and oriented x3, extra-ocular muscles intact, sensation grossly intact.  HEENT: Normocephalic, atraumatic, pupils equal round reactive to light, neck supple, no masses, no lymphadenopathy, thyroid nonpalpable.  Skin: Warm and dry, no rashes. Cardiac: Regular rate and rhythm, no murmurs rubs or gallops, no lower extremity edema.  Respiratory: Clear to auscultation bilaterally. Not using accessory muscles, speaking in full sentences. Left Ankle: No visible erythema. Mild swelling anterior to the lateral malleolus. Range of motion is full in all directions. Strength is 5/5 in all directions. Stable lateral and medial ligaments; squeeze test and kleiger test unremarkable. Talar dome nontender. Pain  present at base of 5th MT. No tenderness over cuboid. No tenderness over N spot or navicular prominence. No tenderness on posterior aspect of medial malleolus. Pain reproducible with palpation at the lateral malleolus. Ankle pain is reproducible with resisted active dorsiflexion and eversion of the ankle. Tenderness to palpation of the peroneal tendon without noted subluxation. Negative tarsal tunnel tinel's Able to walk 4 steps, but with visible discomfort and mild limp. Left Foot: No visible erythema or swelling. Range of motion is full in all directions. Strength is 5/5 in all directions. No hallux valgus. Noted pes planus. No abnormal callus noted. No pain over the navicular prominence. Pain at base of fifth metatarsal. Tenderness to palpation at the calcaneal insertion of plantar fascia. No pain at the Achilles insertion. No pain over the calcaneal bursa. No pain of the retrocalcaneal bursa. No tenderness to palpation over the tarsals, metatarsals, or phalanges. No hallux rigidus or limitus. No tenderness palpation over interphalangeal joints. No pain with compression of the metatarsal heads. Neurovascularly intact distally.  Procedure: Real-time Ultrasound Guided Injection of left peroneal tendon sheath Device: GE Logiq E  Verbal informed consent obtained.  Time-out conducted.  Noted no overlying erythema, induration, or other signs of local infection.  Skin prepped in a sterile fashion.  Local anesthesia: Topical Ethyl chloride.  With sterile technique and under real time ultrasound guidance: 1 cc kenalog 40, 4 cc lidocaine injected easily into the tendon sheath.  Completed without difficulty  Pain immediately resolved suggesting accurate placement of the medication.  Advised to call if fevers/chills, erythema, induration, drainage, or persistent bleeding.  Images permanently stored and available for review in the ultrasound unit.  Impression:  Technically successful  ultrasound guided injection.  Impression and Recommendations:   Left Ankle Pain: Peroneal tendinitis is likely in this patient with overuse of the ankle with work as a Development worker, international aid, pain with palpation of the peroneal tendons, pain with palpation at the base of the fifth metatarsal, and reproducible pain with resisted active dorsiflexion and eversion of the ankle. This has persisted despite use of Diclofenac and Tylenol; therefore, she is a candidate for peroneal tendon sheath injection. - Peroneal tendon sheath injection today - Referral to formal PT - Return for custom orthotics  Left Foot Pain: Plantar fasciitis is likely in this patient with pain of the heel and tenderness to palpation at the calcaneal insertion of the plantar fascia. - Return for custom orthotics

## 2014-04-07 ENCOUNTER — Ambulatory Visit (INDEPENDENT_AMBULATORY_CARE_PROVIDER_SITE_OTHER): Payer: BC Managed Care – PPO | Admitting: Sports Medicine

## 2014-04-07 ENCOUNTER — Encounter: Payer: Self-pay | Admitting: Sports Medicine

## 2014-04-07 VITALS — BP 123/77 | HR 105 | Ht 64.0 in | Wt 215.0 lb

## 2014-04-07 DIAGNOSIS — M7672 Peroneal tendinitis, left leg: Secondary | ICD-10-CM

## 2014-04-07 DIAGNOSIS — M7750 Other enthesopathy of unspecified foot: Secondary | ICD-10-CM | POA: Diagnosis not present

## 2014-04-07 MED ORDER — LOSARTAN POTASSIUM-HCTZ 50-12.5 MG PO TABS
ORAL_TABLET | ORAL | Status: DC
Start: 2014-04-07 — End: 2014-09-06

## 2014-04-07 MED ORDER — CYCLOBENZAPRINE HCL 10 MG PO TABS
ORAL_TABLET | ORAL | Status: DC
Start: 2014-04-07 — End: 2014-05-18

## 2014-04-07 NOTE — Progress Notes (Signed)
    Patient was fitted for a : standard, cushioned, semi-rigid orthotic. The orthotic was heated and afterward the patient stood on the orthotic blank positioned on the orthotic stand. The patient was positioned in subtalar neutral position and 10 degrees of ankle dorsiflexion in a weight bearing stance. After completion of molding, a stable base was applied to the orthotic blank. The blank was ground to a stable position for weight bearing. Size: 8 Base: Blue EVA Additional Posting and Padding: None The patient ambulated these, and they were very comfortable.  I spent 40 minutes with this patient, greater than 50% was face-to-face time counseling regarding the below diagnosis.

## 2014-04-07 NOTE — Assessment & Plan Note (Signed)
Custom orthotics as above. 

## 2014-04-20 ENCOUNTER — Ambulatory Visit (INDEPENDENT_AMBULATORY_CARE_PROVIDER_SITE_OTHER): Payer: BC Managed Care – PPO | Admitting: Physician Assistant

## 2014-04-20 VITALS — Temp 98.3°F

## 2014-04-20 DIAGNOSIS — Z23 Encounter for immunization: Secondary | ICD-10-CM

## 2014-04-20 NOTE — Progress Notes (Signed)
   Subjective:    Patient ID: Tracy Landry, female    DOB: 06-16-1961, 53 y.o.   MRN: [id number]  HPI    Review of Systems     Objective:   Physical Exam        Assessment & Plan:  Flu shot given without complication. Iran Planas PA-C

## 2014-04-25 ENCOUNTER — Telehealth: Payer: Self-pay

## 2014-04-25 ENCOUNTER — Other Ambulatory Visit: Payer: Self-pay | Admitting: Obstetrics & Gynecology

## 2014-04-25 DIAGNOSIS — Z0000 Encounter for general adult medical examination without abnormal findings: Secondary | ICD-10-CM

## 2014-04-25 NOTE — Telephone Encounter (Signed)
Added to allergy list.  

## 2014-05-04 ENCOUNTER — Ambulatory Visit: Payer: BC Managed Care – PPO

## 2014-05-12 ENCOUNTER — Telehealth: Payer: Self-pay | Admitting: Obstetrics & Gynecology

## 2014-05-12 ENCOUNTER — Ambulatory Visit: Payer: BC Managed Care – PPO | Admitting: Obstetrics & Gynecology

## 2014-05-12 NOTE — Telephone Encounter (Signed)
Thank you for message.  Encounter closed.

## 2014-05-12 NOTE — Telephone Encounter (Signed)
Pt called to reschedule her aex because her wife just got out of hospital and need to take care of her.

## 2014-05-18 ENCOUNTER — Encounter: Payer: Self-pay | Admitting: Family Medicine

## 2014-05-18 ENCOUNTER — Ambulatory Visit (INDEPENDENT_AMBULATORY_CARE_PROVIDER_SITE_OTHER): Payer: BC Managed Care – PPO | Admitting: Family Medicine

## 2014-05-18 VITALS — BP 111/77 | HR 80 | Temp 97.9°F | Wt 213.0 lb

## 2014-05-18 DIAGNOSIS — J4521 Mild intermittent asthma with (acute) exacerbation: Secondary | ICD-10-CM | POA: Diagnosis not present

## 2014-05-18 MED ORDER — PREDNISONE 20 MG PO TABS: ORAL_TABLET | ORAL | Status: AC

## 2014-05-18 MED ORDER — HYDROCODONE-HOMATROPINE 5-1.5 MG/5ML PO SYRP: 5.0000 mL | ORAL_SOLUTION | Freq: Three times a day (TID) | ORAL | Status: DC | PRN

## 2014-05-18 MED ORDER — DOXYCYCLINE HYCLATE 100 MG PO TABS: ORAL_TABLET | ORAL | Status: AC

## 2014-05-18 NOTE — Progress Notes (Signed)
CC: Tracy Landry is a 53 y.o. female is here for Sinusitis   Subjective: HPI:  Complains of bilateral facial pressure localized in the cheek that does not radiate. Has been present for one week worsening on a daily basis. Accompanied by nonproductive cough and occasional shortness of breath with exertion. Mild benefit from a shortness of breath standpoint with albuterol. No interventions for any other aspect. It's been present on a daily basis and worse first thing in the morning. Denies chest pain, bloody sputum, confusion, wheezing, motor sensory disturbances. Reports subjective fever over the past 2 days.    Review Of Systems Outlined In HPI  Past Medical History  Diagnosis Date  . GERD (gastroesophageal reflux disease)   . Asthma   . Depression   . Back pain   . Hypertension   . 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  . Wears glasses     Past Surgical History  Procedure Laterality Date  . Abdominal hysterectomy  3/09  . Right arm reconstruction  1/01  . Foot surgery  2000    rt foot fx  . Appendectomy    . Ablation of dysrhythmic focus  10/07/2012    SVT  . Hardware removal  2003    rt arm  . Mass excision Left 07/07/2013    Procedure: EXCISION MASS LEFT RING FINGER;  Surgeon: Wynonia Sours, MD;  Location: [place];  Service: Orthopedics;  Laterality: Left;  . Repair extensor tendon Left 07/07/2013    Procedure: REPAIR EXTENSOR TENDON LEFT WITH DEBRIDMENT PROXIMAL INTERPHALANGEAL JOINT;  Surgeon: Wynonia Sours, MD;  Location: [place];  Service: Orthopedics;  Laterality: Left;   Family History  Problem Relation Age of Onset  . Alcohol abuse Mother   . Diabetes Mother   . Hyperlipidemia Mother   . Hypertension Mother   . Alzheimer's disease Father   . Diabetes Sister   . Hyperlipidemia Sister   . Hypertension Sister   . Heart attack Maternal Uncle   . Cancer Maternal Grandmother     lung  . Heart  attack Maternal Grandfather   . Heart disease Mother     Rheumatic disease    History   Social History  . Marital Status: Married    Spouse Name: N/A    Number of Children: N/A  . 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 Smoking status: Never Smoker   . Smokeless tobacco: Never Used  . Alcohol Use: 0.0 oz/week     Comment: 1-2 glasses wine per night  . Drug Use: No  . Sexual Activity: Yes    Partners: Female   Other Topics Concern  . Not on file   Social History Narrative  . No narrative on file     Objective: BP 111/77  Pulse 80  Temp(Src) 97.9 F (36.6 C) (Oral)  Wt 213 lb (96.616 kg)  LMP 08/05/2006  General: Alert and Oriented, No Acute Distress HEENT: Pupils equal, round, reactive to light. Conjunctivae clear.  External ears unremarkable, canals clear with intact TMs with appropriate landmarks.  Middle ear appears open without effusion. Pink inferior turbinates.  Moist mucous membranes, pharynx without inflammation nor lesions however moderate cobblestoning and postnasal drip.  Shotty anterior chain lymphadenopathy on the left. Lungs: Comfortable work of breathing with good air movement, no rhonchi nor rales but trace  and expiratory wheezing in all lung fields Extremities: No peripheral edema.  Strong peripheral pulses.  Mental Status: No depression, anxiety, nor agitation. Skin: Warm and dry.  Assessment & Plan: Destinae was seen today for sinusitis.  Diagnoses and associated orders for this visit:  Asthma with exacerbation, mild intermittent - doxycycline (VIBRA-TABS) 100 MG tablet; One by mouth twice a day for ten days. - predniSONE (DELTASONE) 20 MG tablet; Three tabs at once daily for five days. - HYDROcodone-homatropine (HYCODAN) 5-1.5 MG/5ML syrup; Take 5 mLs by mouth every 8 (eight) hours as needed for cough.    Asthma exacerbation due to bacterial sinusitis therefore start doxycycline and  consider prednisone however discussed that if fearful of side effects she could wait after 48 hours of doxycycline determine if she truly needs to start prednisone for wheezing, shortness of breath, cough. Hycodan to help with cough   Return if symptoms worsen or fail to improve.

## 2014-06-01 ENCOUNTER — Other Ambulatory Visit: Payer: Self-pay | Admitting: Obstetrics & Gynecology

## 2014-06-01 NOTE — Telephone Encounter (Signed)
Last refill 05/31/13 #90/12 refills Last AEX 03/03/13 Next appt 06/23/14  Please advise.

## 2014-06-03 ENCOUNTER — Other Ambulatory Visit: Payer: Self-pay | Admitting: Physician Assistant

## 2014-06-06 ENCOUNTER — Other Ambulatory Visit: Payer: Self-pay | Admitting: Physician Assistant

## 2014-06-09 ENCOUNTER — Ambulatory Visit (INDEPENDENT_AMBULATORY_CARE_PROVIDER_SITE_OTHER): Payer: BC Managed Care – PPO | Admitting: Sports Medicine

## 2014-06-09 ENCOUNTER — Encounter: Payer: Self-pay | Admitting: Sports Medicine

## 2014-06-09 VITALS — BP 145/91 | HR 105 | Ht 64.0 in | Wt 216.0 lb

## 2014-06-09 DIAGNOSIS — M722 Plantar fascial fibromatosis: Secondary | ICD-10-CM | POA: Diagnosis not present

## 2014-06-09 DIAGNOSIS — M7672 Peroneal tendinitis, left leg: Secondary | ICD-10-CM

## 2014-06-09 NOTE — Progress Notes (Signed)
  Subjective:    CC: left heel pain  HPI: Tracy Landry returns, I saw her last month for Peroneal tendinitis, we injected her peroneal tendon sheath, built her custom orthotics and she returns pain-free from this perspective. She now has pain at the calcaneal insertion of the plantar fascia, moderate, persistent worse with the first few steps in the morning, without radiation.  Past medical history, Surgical history, Family history not pertinant except as noted below, Social history, Allergies, and medications have been entered into the medical record, reviewed, and no changes needed.   Review of Systems: No fevers, chills, night sweats, weight loss, chest pain, or shortness of breath.   Objective:    General: Well Developed, well nourished, and in no acute distress.  Neuro: Alert and oriented x3, extra-ocular muscles intact, sensation grossly intact.  HEENT: Normocephalic, atraumatic, pupils equal round reactive to light, neck supple, no masses, no lymphadenopathy, thyroid nonpalpable.  Skin: Warm and dry, no rashes. Cardiac: Regular rate and rhythm, no murmurs rubs or gallops, no lower extremity edema.  Respiratory: Clear to auscultation bilaterally. Not using accessory muscles, speaking in full sentences. leftFoot: No visible erythema or swelling. Range of motion is full in all directions. Strength is 5/5 in all directions. No hallux valgus. No pes cavus or pes planus. No abnormal callus noted. No pain over the navicular prominence, or base of fifth metatarsal. tender to palpation of the calcaneal insertion of plantar fascia. No pain at the Achilles insertion. No pain over the calcaneal bursa. No pain of the retrocalcaneal bursa. No tenderness to palpation over the tarsals, metatarsals, or phalanges. No hallux rigidus or limitus. No tenderness palpation over interphalangeal joints. No pain with compression of the metatarsal heads. Neurovascularly intact distally.  Procedure:  Real-time Ultrasound Guided Injection of Left plantar fascia Device: GE Logiq E  Verbal informed consent obtained.  Time-out conducted.  Noted no overlying erythema, induration, or other signs of local infection.  Skin prepped in a sterile fashion.  Local anesthesia: Topical Ethyl chloride.  With sterile technique and under real time ultrasound guidance:  1 mL kenalog 40, 4 mL lidocaine injected easily just deep to the calcaneal origin of the plantar fascia. Completed without difficulty  Pain immediately resolved suggesting accurate placement of the medication.  Advised to call if fevers/chills, erythema, induration, drainage, or persistent bleeding.  Images permanently stored and available for review in the ultrasound unit.  Impression: Technically successful ultrasound guided injection.  Impression and Recommendations:

## 2014-06-09 NOTE — Assessment & Plan Note (Signed)
Continue orthotics. Injection as above. Home rehabilitation exercises given. Return in one month.

## 2014-06-09 NOTE — Assessment & Plan Note (Signed)
Resolved after injection and orthotics.

## 2014-06-21 ENCOUNTER — Other Ambulatory Visit: Payer: Self-pay | Admitting: Sports Medicine

## 2014-06-22 ENCOUNTER — Ambulatory Visit: Payer: BC Managed Care – PPO

## 2014-06-22 ENCOUNTER — Other Ambulatory Visit: Payer: Self-pay | Admitting: *Deleted

## 2014-06-22 MED ORDER — CYCLOBENZAPRINE HCL 10 MG PO TABS: ORAL_TABLET | ORAL | Status: DC

## 2014-06-23 ENCOUNTER — Ambulatory Visit (INDEPENDENT_AMBULATORY_CARE_PROVIDER_SITE_OTHER): Payer: BC Managed Care – PPO | Admitting: Nurse Practitioner

## 2014-06-23 ENCOUNTER — Encounter: Payer: Self-pay | Admitting: Nurse Practitioner

## 2014-06-23 VITALS — BP 130/68 | HR 78 | Resp 16 | Ht 64.0 in | Wt 214.0 lb

## 2014-06-23 DIAGNOSIS — R8290 Unspecified abnormal findings in urine: Secondary | ICD-10-CM

## 2014-06-23 DIAGNOSIS — Z0000 Encounter for general adult medical examination without abnormal findings: Secondary | ICD-10-CM

## 2014-06-23 LAB — POCT URINALYSIS DIPSTICK
PH UA: 5
Urobilinogen, UA: NEGATIVE

## 2014-06-23 MED ORDER — VENLAFAXINE HCL ER 37.5 MG PO CP24: ORAL_CAPSULE | ORAL | Status: DC

## 2014-06-23 NOTE — Patient Instructions (Signed)

## 2014-06-23 NOTE — Progress Notes (Signed)
53 y.o. G0P0 Married (partner is Williemae Area) Caucasian Fe here for annual exam. No new health problems for her.  The partner was in hospital recently for renal disease.  Her father who had worsening of Alzheimer's died about 2 weeks ago.  She has only a small amount of paper work to be done and she and sister will have settled everything. She thinks she is ready to come off Effexor.   Patient's last menstrual period was 10/28/2006.          Sexually active: Yes.    The current method of family planning is status post hysterectomy.    Exercising: Yes.    Work  Smoker:  no  Health Maintenance: Pap:  08/07/2006 Negative  MMG:  04/21/13 Bi-Rads 2; sched for 07/06/14 Colonoscopy:   05/08/2012 f/u in 10 years ago  BMD:   10/31/10 normal TDaP:  10/20/2013  Labs: Hgb: 13.2  ; Urine: Leuk's 2+ no symptoms     reports that she has never smoked. She has never used smokeless tobacco. She reports that she drinks alcohol. She reports that she does not use illicit drugs.  Past Medical History  Diagnosis Date  . GERD (gastroesophageal reflux disease)   . Asthma   . Depression   . Back pain   . Hypertension   . 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  . Wears glasses     Past Surgical History  Procedure Laterality Date  . Abdominal hysterectomy  3/09  . Right arm reconstruction  1/01  . Foot surgery  2000    rt foot fx  . Appendectomy    . Ablation of dysrhythmic focus  10/07/2012    SVT  . Hardware removal  2003    rt arm  . Mass excision Left 07/07/2013    Procedure: EXCISION MASS LEFT RING FINGER;  Surgeon: Wynonia Sours, MD;  Location: [facility];  Service: Orthopedics;  Laterality: Left;  . Repair extensor tendon Left 07/07/2013    Procedure: REPAIR EXTENSOR TENDON LEFT WITH DEBRIDMENT PROXIMAL INTERPHALANGEAL JOINT;  Surgeon: Wynonia Sours, MD;  Location: [place];  Service: Orthopedics;  Laterality: Left;    Current Outpatient  Prescriptions  Medication Sig Dispense Refill  . albuterol (PROVENTIL HFA;VENTOLIN HFA) 108 (90 BASE) MCG/ACT inhaler Inhale 2 puffs into the lungs every 6 (six) hours as needed. For wheezing 1 Inhaler 1  . cyclobenzaprine (FLEXERIL) 10 MG tablet TAKE ONE TABLET BY MOUTH NIGHTLY AT BEDTIME 30 tablet 0  . DEXILANT 60 MG capsule TAKE ONE CAPSULE BY MOUTH ONCE DAILY IN THE MORNING 30 capsule 0  . diclofenac (VOLTAREN) 75 MG EC tablet TAKE ONE TABLET BY MOUTH ONCE DAILY 30 tablet 0  . HYDROcodone-homatropine (HYCODAN) 5-1.5 MG/5ML syrup Take 5 mLs by mouth every 8 (eight) hours as needed for cough. 120 mL 0  . losartan-hydrochlorothiazide (HYZAAR) 50-12.5 MG per tablet TAKE ONE TABLET BY MOUTH ONE TIME DAILY 30 tablet 4  . montelukast (SINGULAIR) 10 MG tablet Take one tablet by mouth   nightly at bedtime 30 tablet 10  . Multiple Vitamins-Minerals (MULTIVITAMIN PO) Take by mouth daily.    Marland Kitchen venlafaxine XR (EFFEXOR-XR) 37.5 MG 24 hr capsule TAKE THREE CAPSULES BY MOUTH ONCE DAILY 180 capsule 3   No current facility-administered medications for this visit.    Family History  Problem Relation Age of Onset  . Alcohol abuse Mother   . Diabetes Mother   .  Hyperlipidemia Mother   . Hypertension Mother   . Alzheimer's disease Father   . Diabetes Sister   . Hyperlipidemia Sister   . Hypertension Sister   . Heart attack Maternal Uncle   . Cancer Maternal Grandmother     lung  . Heart attack Maternal Grandfather   . Heart disease Mother     Rheumatic disease    ROS:  Pertinent items are noted in HPI.  Otherwise, a comprehensive ROS was negative.  Exam:   BP 130/68 mmHg  Pulse 78  Resp 16  Ht 5\' 4"  (1.626 m)  Wt 214 lb (97.07 kg)  BMI 36.72 kg/m2  LMP 10/28/2006 Height: 5\' 4"  (162.6 cm)  Ht Readings from Last 3 Encounters:  06/23/14 5\' 4"  (1.626 m)  06/09/14 5\' 4"  (1.626 m)  04/07/14 5\' 4"  (1.626 m)    General appearance: alert, cooperative and appears stated age Head: Normocephalic,  without obvious abnormality, atraumatic Neck: no adenopathy, supple, symmetrical, trachea midline and thyroid normal to inspection and palpation Lungs: clear to auscultation bilaterally Breasts: normal appearance, no masses or tenderness Heart: regular rate and rhythm Abdomen: soft, non-tender; no masses,  no organomegaly Extremities: extremities normal, atraumatic, no cyanosis or edema Skin: Skin color, texture, turgor normal. No rashes or lesions Lymph nodes: Cervical, supraclavicular, and axillary nodes normal. No abnormal inguinal nodes palpated Neurologic: Grossly normal   Pelvic: External genitalia:  no lesions              Urethra:  normal appearing urethra with no masses, tenderness or lesions              Bartholin's and Skene's: normal                 Vagina: normal appearing vagina with normal color and discharge, no lesions              Cervix: absent              Pap taken: No. Bimanual Exam:  Uterus:  uterus absent              Adnexa: no mass, fullness, tenderness               Rectovaginal: Confirms               Anus:  normal sphincter tone, no lesions  A:  Well Woman with normal exam  S/P TAH & RSO for endometrial polyp, right OV cyst 10/2006, not on HRT  Osteopenia - borderline  H/O colonic polyps  History of ankylosing Spondylitis  History of SVT wit cardiac ablation 10/07/12  Grief reaction   P:   Reviewed health and wellness pertinent to exam  Pap smear not taken today  Mammogram is due now and will schedule  Refill on Effexor (currently has tried 75 mg daily and ok) - will follow a slow taper schedule- 75 mg and 37.5 mg every other day for a month, then 37.5 mg daily for a month, then 37.5 mg every other day, then 37.5 mg every third day for a month, then can stop.  Counseled on breast self exam, mammography screening, adequate intake of calcium and vitamin D, diet and exercise, Kegel's exercises return annually or prn  An After Visit Summary was printed  and given to the patient.

## 2014-06-24 LAB — HEMOGLOBIN A1C
Hgb A1c MFr Bld: 5.9 % — ABNORMAL HIGH (ref <5.7)
Mean Plasma Glucose: 123 mg/dL — ABNORMAL HIGH (ref <117)

## 2014-06-24 LAB — URINALYSIS, MICROSCOPIC ONLY
BACTERIA UA: NONE SEEN
Casts: NONE SEEN
Squamous Epithelial / LPF: NONE SEEN

## 2014-06-24 LAB — VITAMIN D 25 HYDROXY (VIT D DEFICIENCY, FRACTURES): Vit D, 25-Hydroxy: 30 ng/mL (ref 30–100)

## 2014-06-24 LAB — HEMOGLOBIN, FINGERSTICK: Hemoglobin, fingerstick: 13.2 g/dL (ref 12.0–16.0)

## 2014-06-24 LAB — TSH: TSH: 2.553 u[IU]/mL (ref 0.350–4.500)

## 2014-06-25 LAB — LIPID PANEL
Cholesterol: 240 mg/dL — ABNORMAL HIGH (ref 0–200)
HDL: 67 mg/dL (ref >39)
LDL Cholesterol: 148 mg/dL — ABNORMAL HIGH (ref 0–99)
Total CHOL/HDL Ratio: 3.6 Ratio
Triglycerides: 126 mg/dL (ref <150)
VLDL: 25 mg/dL (ref 0–40)

## 2014-06-25 LAB — URINE CULTURE

## 2014-06-25 LAB — COMPREHENSIVE METABOLIC PANEL
ALBUMIN: 4 g/dL (ref 3.5–5.2)
ALT: 21 U/L (ref 0–35)
AST: 20 U/L (ref 0–37)
Alkaline Phosphatase: 70 U/L (ref 39–117)
BUN: 18 mg/dL (ref 6–23)
CALCIUM: 9.3 mg/dL (ref 8.4–10.5)
CHLORIDE: 102 meq/L (ref 96–112)
CO2: 30 meq/L (ref 19–32)
Creat: 0.85 mg/dL (ref 0.50–1.10)
Glucose, Bld: 92 mg/dL (ref 70–99)
Potassium: 4.2 mEq/L (ref 3.5–5.3)
Sodium: 140 mEq/L (ref 135–145)
Total Bilirubin: 0.2 mg/dL (ref 0.2–1.2)
Total Protein: 7.2 g/dL (ref 6.0–8.3)

## 2014-06-26 NOTE — Progress Notes (Signed)
Encounter reviewed by Dr. Leroy Pettway Silva.  

## 2014-07-03 ENCOUNTER — Other Ambulatory Visit: Payer: Self-pay | Admitting: Family Medicine

## 2014-07-03 ENCOUNTER — Other Ambulatory Visit: Payer: Self-pay | Admitting: Physician Assistant

## 2014-07-06 ENCOUNTER — Ambulatory Visit (INDEPENDENT_AMBULATORY_CARE_PROVIDER_SITE_OTHER): Payer: BC Managed Care – PPO

## 2014-07-06 DIAGNOSIS — Z1231 Encounter for screening mammogram for malignant neoplasm of breast: Secondary | ICD-10-CM

## 2014-07-06 DIAGNOSIS — Z0000 Encounter for general adult medical examination without abnormal findings: Secondary | ICD-10-CM

## 2014-07-07 ENCOUNTER — Ambulatory Visit: Payer: BC Managed Care – PPO | Admitting: Sports Medicine

## 2014-07-14 ENCOUNTER — Encounter (HOSPITAL_COMMUNITY): Payer: Self-pay | Admitting: Internal Medicine

## 2014-07-21 ENCOUNTER — Other Ambulatory Visit: Payer: Self-pay | Admitting: Sports Medicine

## 2014-08-03 ENCOUNTER — Other Ambulatory Visit: Payer: Self-pay | Admitting: Physician Assistant

## 2014-08-03 ENCOUNTER — Other Ambulatory Visit: Payer: Self-pay | Admitting: Family Medicine

## 2014-08-08 ENCOUNTER — Ambulatory Visit (INDEPENDENT_AMBULATORY_CARE_PROVIDER_SITE_OTHER): Payer: BLUE CROSS/BLUE SHIELD | Admitting: Sports Medicine

## 2014-08-08 ENCOUNTER — Encounter: Payer: Self-pay | Admitting: Sports Medicine

## 2014-08-08 VITALS — BP 120/72 | HR 77 | Wt 222.0 lb

## 2014-08-08 DIAGNOSIS — M722 Plantar fascial fibromatosis: Secondary | ICD-10-CM

## 2014-08-08 DIAGNOSIS — M76822 Posterior tibial tendinitis, left leg: Secondary | ICD-10-CM

## 2014-08-08 NOTE — Progress Notes (Signed)
  Subjective:    CC: follow-up  HPI: Left ankle pain: Initially treated for plantar fasciitis which has since resolved, she now has pain that she localizes behind the medial malleolus, moderate, persistent with radiation into the plantar aspect of the foot. No numbness or tingling, no trauma, She did have a recent change in activity level and has been doing some more work around the house.  Past medical history, Surgical history, Family history not pertinant except as noted below, Social history, Allergies, and medications have been entered into the medical record, reviewed, and no changes needed.   Review of Systems: No fevers, chills, night sweats, weight loss, chest pain, or shortness of breath.   Objective:    General: Well Developed, well nourished, and in no acute distress.  Neuro: Alert and oriented x3, extra-ocular muscles intact, sensation grossly intact.  HEENT: Normocephalic, atraumatic, pupils equal round reactive to light, neck supple, no masses, no lymphadenopathy, thyroid nonpalpable.  Skin: Warm and dry, no rashes. Cardiac: Regular rate and rhythm, no murmurs rubs or gallops, no lower extremity edema.  Respiratory: Clear to auscultation bilaterally. Not using accessory muscles, speaking in full sentences. Left ankle: No visible erythema or swelling. Range of motion is full in all directions. Strength is 5/5 in all directions. Stable lateral and medial ligaments; squeeze test and kleiger test unremarkable; Talar dome nontender; No pain at base of 5th MT; No tenderness over cuboid; No tenderness over N spot or navicular prominence Exquisitely tender to palpation behind the medial malleolus, reproduction of pain with resisted inversion. There is no reproduction of pain with resisted flexion of the small toes or the great toe. No sign of peroneal tendon subluxations; Negative tarsal tunnel tinel's Able to walk 4 steps.  Procedure: Real-time Ultrasound Guided Injection of  left tibialis posterior tendon sheath Device: GE Logiq E  Verbal informed consent obtained.  Time-out conducted.  Noted no overlying erythema, induration, or other signs of local infection.  Skin prepped in a sterile fashion.  Local anesthesia: Topical Ethyl chloride.  With sterile technique and under real time ultrasound guidance:  Using a 25-gauge needle and taking extra care to avoid intratendinous injection total of 1 mL kenalog 40, 2 mL lidocaine was injected between the tibialis posterior and the flexor digitorum longus at the level of the malleolus. Completed without difficulty  Pain immediately resolved suggesting accurate placement of the medication.  Advised to call if fevers/chills, erythema, induration, drainage, or persistent bleeding.  Images permanently stored and available for review in the ultrasound unit.  Impression: Technically successful ultrasound guided injection.  Impression and Recommendations:

## 2014-08-08 NOTE — Assessment & Plan Note (Signed)
Continues to be resolved after injection. 

## 2014-08-08 NOTE — Assessment & Plan Note (Signed)
Guided injection into the tibialis posterior and flexor digitorum longus tendon sheath. CAM boot for 3 weeks. Patient informed of the risk of rupture. Return in 3 weeks, MRI for no better.  Continue orthotics.

## 2014-08-23 ENCOUNTER — Encounter: Payer: Self-pay | Admitting: Physician Assistant

## 2014-08-23 ENCOUNTER — Ambulatory Visit (INDEPENDENT_AMBULATORY_CARE_PROVIDER_SITE_OTHER): Payer: BLUE CROSS/BLUE SHIELD | Admitting: Physician Assistant

## 2014-08-23 VITALS — BP 127/77 | HR 109 | Temp 99.6°F

## 2014-08-23 DIAGNOSIS — J209 Acute bronchitis, unspecified: Secondary | ICD-10-CM | POA: Diagnosis not present

## 2014-08-23 DIAGNOSIS — J4521 Mild intermittent asthma with (acute) exacerbation: Secondary | ICD-10-CM | POA: Diagnosis not present

## 2014-08-23 MED ORDER — IPRATROPIUM-ALBUTEROL 0.5-2.5 (3) MG/3ML IN SOLN: 3.0000 mL | RESPIRATORY_TRACT | Status: DC

## 2014-08-23 MED ORDER — PREDNISONE 50 MG PO TABS: ORAL_TABLET | ORAL | Status: DC

## 2014-08-23 MED ORDER — IPRATROPIUM-ALBUTEROL 0.5-2.5 (3) MG/3ML IN SOLN
3.0000 mL | RESPIRATORY_TRACT | Status: DC
  Administered 2014-08-23: 3 mL via RESPIRATORY_TRACT

## 2014-08-23 MED ORDER — METHYLPREDNISOLONE SODIUM SUCC 125 MG IJ SOLR
125.0000 mg | Freq: Once | INTRAMUSCULAR | Status: AC
  Administered 2014-08-23: 125 mg via INTRAMUSCULAR

## 2014-08-23 MED ORDER — AZITHROMYCIN 250 MG PO TABS: ORAL_TABLET | ORAL | Status: DC

## 2014-08-23 MED ORDER — HYDROCOD POLST-CHLORPHEN POLST 10-8 MG/5ML PO LQCR: 5.0000 mL | Freq: Two times a day (BID) | ORAL | Status: DC | PRN

## 2014-08-23 NOTE — Progress Notes (Signed)
   Subjective:    Patient ID: KEREN ALVERIO, female    DOB: 05-Nov-1960, 54 y.o.   MRN: [id number]  HPI Patient is a 54 year old female who presents to the clinic with fever, cough, wheezing for 48 hours. Patient does have asthma. She cannot stop coughing. She's been using her rescue inhaler every 2 hours. She denies any sore throat or ear pain. Her cough is mostly dry. Her fever last night was 101.7. She has taken Tylenol for her fever today. She denies any nausea. Denies any body aches or extreme fatigue.   Review of Systems  All other systems reviewed and are negative.      Objective:   Physical Exam  Constitutional: She is oriented to person, place, and time. She appears well-developed and well-nourished.  HENT:  Head: Normocephalic and atraumatic.  Right Ear: External ear normal.  Left Ear: External ear normal.  Nose: Nose normal.  Mouth/Throat: Oropharynx is clear and moist. No oropharyngeal exudate.  Eyes: Conjunctivae are normal. Right eye exhibits no discharge. Left eye exhibits no discharge.  Neck: Normal range of motion. Neck supple.  Cardiovascular: Regular rhythm and normal heart sounds.   Tachycardia at 109.  Pulmonary/Chest:  Bilateral coarse breath sounds. Bilateral wheezing and rhonchi.  Lymphadenopathy:    She has no cervical adenopathy.  Neurological: She is alert and oriented to person, place, and time.  Skin: Skin is dry.  Psychiatric: She has a normal mood and affect. Her behavior is normal.          Assessment & Plan:  Acute bronchitis with asthma exacerbation-Solu-Medrol 125 mg IM given in office today. DuoNeb nebulizer treatment given in office today. With some benefit. See pack and prednisone for the next 5 days started. Continue to use albuterol inhaler every 2 hours as needed for shortness of breath and wheezing. Tussionex given for cough only at bedtime. Encouraged patient to cough throughout the day. Rest and hydration discussed. Follow-up as  needed.

## 2014-08-25 ENCOUNTER — Telehealth: Payer: Self-pay | Admitting: *Deleted

## 2014-08-25 ENCOUNTER — Encounter: Payer: Self-pay | Admitting: Physician Assistant

## 2014-08-25 ENCOUNTER — Other Ambulatory Visit: Payer: Self-pay | Admitting: *Deleted

## 2014-08-25 MED ORDER — FLUCONAZOLE 150 MG PO TABS: 150.0000 mg | ORAL_TABLET | Freq: Once | ORAL | Status: DC

## 2014-08-25 NOTE — Telephone Encounter (Signed)
[name] left vm stating that Tracy Landry is still coughing & having trouble sleeping.  The tussionex you gave her isn't helping & she wanted to know if there is something better that you could give her.  Please advise.

## 2014-08-26 NOTE — Telephone Encounter (Signed)
tussinonex one of better ones. Sent partner a note. Concerned if not getting better needs CXR and further work up.

## 2014-08-28 ENCOUNTER — Emergency Department
Admission: EM | Admit: 2014-08-28 | Discharge: 2014-08-28 | Disposition: A | Discharge status: Home / Self Care | Payer: BLUE CROSS/BLUE SHIELD | Source: Home / Self Care | Attending: Emergency Medicine | Admitting: Emergency Medicine

## 2014-08-28 ENCOUNTER — Emergency Department (INDEPENDENT_AMBULATORY_CARE_PROVIDER_SITE_OTHER): Payer: BLUE CROSS/BLUE SHIELD

## 2014-08-28 DIAGNOSIS — R05 Cough: Secondary | ICD-10-CM | POA: Diagnosis not present

## 2014-08-28 DIAGNOSIS — R059 Cough, unspecified: Secondary | ICD-10-CM

## 2014-08-28 DIAGNOSIS — R0602 Shortness of breath: Secondary | ICD-10-CM

## 2014-08-28 DIAGNOSIS — J208 Acute bronchitis due to other specified organisms: Secondary | ICD-10-CM

## 2014-08-28 MED ORDER — IPRATROPIUM-ALBUTEROL 0.5-2.5 (3) MG/3ML IN SOLN
3.0000 mL | RESPIRATORY_TRACT | Status: DC
  Administered 2014-08-28: 3 mL via RESPIRATORY_TRACT

## 2014-08-28 MED ORDER — BENZONATATE 100 MG PO CAPS: 100.0000 mg | ORAL_CAPSULE | Freq: Three times a day (TID) | ORAL | Status: DC

## 2014-08-28 MED ORDER — LEVOFLOXACIN 500 MG PO TABS: 500.0000 mg | ORAL_TABLET | Freq: Every day | ORAL | Status: DC

## 2014-08-28 MED ORDER — IPRATROPIUM-ALBUTEROL 0.5-2.5 (3) MG/3ML IN SOLN
3.0000 mL | Freq: Once | RESPIRATORY_TRACT | Status: AC
  Administered 2014-08-28: 3 mL via RESPIRATORY_TRACT

## 2014-08-28 NOTE — Discharge Instructions (Signed)

## 2014-08-28 NOTE — ED Provider Notes (Signed)
CSN: [id number]     Arrival date & time 08/28/14  1442 History   First MD Initiated Contact with Patient 08/28/14 1506     Chief Complaint  Patient presents with  . Cough   (Consider location/radiation/quality/duration/timing/severity/associated sxs/prior Treatment) Patient is a 54 y.o. female presenting with cough. The history is provided by the patient. No language interpreter was used.  Cough Cough characteristics:  Productive Severity:  Moderate Onset quality:  Gradual Duration:  1 week Timing:  Constant Progression:  Worsening Chronicity:  New Smoker: no   Relieved by:  Nothing Worsened by:  Nothing tried Ineffective treatments:  None tried Associated symptoms: fever and shortness of breath   Associated symptoms: no chest pain   Risk factors: recent infection     Past Medical History  Diagnosis Date  . GERD (gastroesophageal reflux disease)   . Asthma   . Depression   . Back pain   . Hypertension   . 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  . Wears glasses    Past Surgical History  Procedure Laterality Date  . Abdominal hysterectomy  3/09  . Right arm reconstruction  1/01  . Foot surgery  2000    rt foot fx  . Appendectomy    . Ablation of dysrhythmic focus  10/07/2012    SVT  . Hardware removal  2003    rt arm  . Mass excision Left 07/07/2013    Procedure: EXCISION MASS LEFT RING FINGER;  Surgeon: Wynonia Sours, MD;  Location: [facility];  Service: Orthopedics;  Laterality: Left;  . Repair extensor tendon Left 07/07/2013    Procedure: REPAIR EXTENSOR TENDON LEFT WITH DEBRIDMENT PROXIMAL INTERPHALANGEAL JOINT;  Surgeon: Wynonia Sours, MD;  Location: [facility];  Service: Orthopedics;  Laterality: Left;  . Supraventricular tachycardia ablation N/A 10/07/2012    Procedure: SUPRAVENTRICULAR TACHYCARDIA ABLATION;  Surgeon: Evans Lance, MD;  Location: [facility] CATH LAB;  Service: Cardiovascular;  Laterality: N/A;    Family History  Problem Relation Age of Onset  . Alcohol abuse Mother   . Diabetes Mother   . Hyperlipidemia Mother   . Hypertension Mother   . Alzheimer's disease Father   . Diabetes Sister   . Hyperlipidemia Sister   . Hypertension Sister   . Heart attack Maternal Uncle   . Cancer Maternal Grandmother     lung  . Heart attack Maternal Grandfather   . Heart disease Mother     Rheumatic disease   History  Substance Use Topics  . Smoking status: Never Smoker   . Smokeless tobacco: Never Used  . Alcohol Use: 0.0 oz/week     Comment: 1-2 glasses wine per night   OB History    Gravida Para Term Preterm AB TAB SAB Ectopic Multiple Living   0              Review of Systems  Constitutional: Positive for fever.  Respiratory: Positive for cough and shortness of breath.   Cardiovascular: Negative for chest pain.  All other systems reviewed and are negative.   Allergies  Codeine sulfate and Influenza vaccines  Home Medications   Prior to Admission medications   Medication Sig Start Date End Date Taking? Authorizing Provider  albuterol (PROVENTIL HFA;VENTOLIN HFA) 108 (90 BASE) MCG/ACT inhaler Inhale 2 puffs into the lungs every 6 (six) hours as needed. For wheezing 09/10/13  Yes Jade L Breeback, PA-C  azithromycin (ZITHROMAX) 250 MG tablet  Take 2 tablets now and then one tablet for 4 days. 08/23/14  Yes Jade L Breeback, PA-C  chlorpheniramine-HYDROcodone (TUSSIONEX) 10-8 MG/5ML LQCR Take 5 mLs by mouth every 12 (twelve) hours as needed for cough (cough, will cause drowsiness.). 08/23/14  Yes Jade L Breeback, PA-C  cyclobenzaprine (FLEXERIL) 10 MG tablet TAKE ONE TABLET BY MOUTH AT BEDTIME 07/21/14  Yes Jade L Breeback, PA-C  DEXILANT 60 MG capsule TAKE ONE CAPSULE BY MOUTH ONCE DAILY IN THE MORNING 08/04/14  Yes Jade L Breeback, PA-C  diclofenac (VOLTAREN) 75 MG EC tablet TAKE ONE TABLET BY MOUTH ONCE DAILY 08/04/14  Yes Jade L Breeback, PA-C  fluconazole (DIFLUCAN) 150 MG  tablet Take 1 tablet (150 mg total) by mouth once. Take 2nd tab 5 days after the first dose if symptoms persist. 08/25/14  Yes Jade L Breeback, PA-C  HYDROcodone-homatropine (HYCODAN) 5-1.5 MG/5ML syrup Take 5 mLs by mouth every 8 (eight) hours as needed for cough. 05/18/14  Yes Sean Hommel, DO  losartan-hydrochlorothiazide (HYZAAR) 50-12.5 MG per tablet TAKE ONE TABLET BY MOUTH ONE TIME DAILY 04/07/14  Yes Silverio Decamp, MD  montelukast (SINGULAIR) 10 MG tablet TAKE ONE TABLET BY MOUTH ONCE DAILY AT BEDTIME 08/04/14  Yes Jade L Breeback, PA-C  Multiple Vitamins-Minerals (MULTIVITAMIN PO) Take by mouth daily.   Yes Historical Provider, MD  predniSONE (DELTASONE) 50 MG tablet Take one tablet for 5 days. 08/23/14  Yes Jade L Breeback, PA-C  venlafaxine XR (EFFEXOR-XR) 37.5 MG 24 hr capsule TAKE THREE CAPSULES BY MOUTH ONCE DAILY 06/23/14  Yes Patricia Rolen-Grubb, FNP   BP 142/79 mmHg  Pulse 100  Temp(Src) 97.3 F (36.3 C) (Oral)  Wt 217 lb (98.431 kg)  SpO2 95%  LMP 10/28/2006 Physical Exam  Constitutional: She is oriented to person, place, and time. She appears well-developed and well-nourished.  HENT:  Head: Normocephalic.  Right Ear: External ear normal.  Left Ear: External ear normal.  Nose: Nose normal.  Mouth/Throat: Oropharynx is clear and moist.  Eyes: EOM are normal. Pupils are equal, round, and reactive to light.  Neck: Normal range of motion.  Cardiovascular: Normal rate and normal heart sounds.   Pulmonary/Chest: Effort normal. She has wheezes.  Abdominal: She exhibits no distension.  Musculoskeletal: Normal range of motion.  Neurological: She is alert and oriented to person, place, and time.  Skin: Skin is warm.  Psychiatric: She has a normal mood and affect.  Nursing note and vitals reviewed.   ED Course  Procedures (including critical care time) Labs Review Labs Reviewed - No data to display  Imaging Review Dg Chest 2 View  08/28/2014   CLINICAL DATA:   Cough and shortness of breath for 1 week  EXAM: CHEST  2 VIEW  COMPARISON:  08/21/2012  FINDINGS: The heart size and mediastinal contours are within normal limits. Both lungs are clear. The visualized skeletal structures are unremarkable.  IMPRESSION: No active cardiopulmonary disease.   Electronically Signed   By: Inez Catalina M.D.   On: 08/28/2014 15:56     MDM   1. Acute bronchitis due to other specified organisms   2. Cough    avs Tessalon levaquin See Jade for recheck in 2 days    Fransico Meadow, [place] 08/28/14 1937

## 2014-08-28 NOTE — ED Notes (Signed)
Patient c/o cough, shortness of breath, wheezing and congestion. Patient was seen at PCP on the 19th and after treatment has not improved.

## 2014-08-29 ENCOUNTER — Ambulatory Visit: Payer: BC Managed Care – PPO | Admitting: Sports Medicine

## 2014-09-02 ENCOUNTER — Other Ambulatory Visit: Payer: Self-pay | Admitting: Physician Assistant

## 2014-09-05 ENCOUNTER — Other Ambulatory Visit: Payer: Self-pay | Admitting: Sports Medicine

## 2014-09-05 ENCOUNTER — Encounter: Payer: Self-pay | Admitting: Physician Assistant

## 2014-09-06 ENCOUNTER — Other Ambulatory Visit: Payer: Self-pay | Admitting: Physician Assistant

## 2014-09-06 MED ORDER — MONTELUKAST SODIUM 10 MG PO TABS: 10.0000 mg | ORAL_TABLET | Freq: Every day | ORAL | Status: DC

## 2014-09-06 MED ORDER — LOSARTAN POTASSIUM-HCTZ 50-12.5 MG PO TABS: ORAL_TABLET | ORAL | Status: DC

## 2014-09-06 MED ORDER — DICLOFENAC SODIUM 75 MG PO TBEC: 75.0000 mg | DELAYED_RELEASE_TABLET | Freq: Every day | ORAL | Status: DC

## 2014-09-12 ENCOUNTER — Encounter: Payer: Self-pay | Admitting: Sports Medicine

## 2014-09-22 ENCOUNTER — Other Ambulatory Visit: Payer: Self-pay | Admitting: Physician Assistant

## 2014-10-01 ENCOUNTER — Other Ambulatory Visit: Payer: Self-pay | Admitting: Physician Assistant

## 2014-10-07 ENCOUNTER — Ambulatory Visit (INDEPENDENT_AMBULATORY_CARE_PROVIDER_SITE_OTHER): Payer: BLUE CROSS/BLUE SHIELD

## 2014-10-07 ENCOUNTER — Ambulatory Visit (INDEPENDENT_AMBULATORY_CARE_PROVIDER_SITE_OTHER): Payer: BLUE CROSS/BLUE SHIELD | Admitting: Podiatrist

## 2014-10-07 ENCOUNTER — Encounter: Payer: Self-pay | Admitting: Podiatrist

## 2014-10-07 VITALS — BP 119/86 | HR 99 | Resp 16

## 2014-10-07 DIAGNOSIS — M779 Enthesopathy, unspecified: Secondary | ICD-10-CM

## 2014-10-07 NOTE — Progress Notes (Signed)
   Subjective:    Patient ID: Tracy Landry, female    DOB: [birth year], 54 y.o.   MRN: [id number]  HPI Comments: "I have a lot of pain in the left foot"  Patient c/o sharp, burning medial foot and heel/posterior too left since December. She has seen her orthopedist and he gave her an injection and told to wear boot for 3 weeks. Its no better and some days worse. Would like another opinion. Already taking Tylenol and Diclofenac for shoulder pain.  Foot Pain Associated symptoms include arthralgias.      Review of Systems  Musculoskeletal: Positive for arthralgias and gait problem.  All other systems reviewed and are negative.      Objective:   Physical Exam GENERAL APPEARANCE: Alert, conversant. Appropriately groomed. No acute distress.  VASCULAR: Pedal pulses palpable at 2/4 DP and PT bilateral.  Capillary refill time is immediate to all digits,  Proximal to distal cooling it warm to warm.  Digital hair growth is present bilateral  NEUROLOGIC: sensation is intact epicritically and protectively to 5.07 monofilament at 5/5 sites bilateral.  Light touch is intact bilateral, vibratory sensation intact bilateral, achilles tendon reflex is intact bilateral.  MUSCULOSKELETAL: pain on the posterior central aspect of the calcaneus is palpated with pain at the insertion site of the achilles tendon medially and lateral slips left foot.   Otherwise acceptable muscle strength, tone and stability bilateral.  Intrinsic muscluature intact bilateral.  Rectus appearance of foot and digits noted bilateral.   DERMATOLOGIC: skin color, texture, and turger are within normal limits.  No preulcerative lesions are seen, no interdigital maceration noted.  No open lesions present.  Digital nails are asymptomatic.    xrays show a haglunds deformity with inflammation posterior aspect of the left calcaneus.  No appreciable bone spur.    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  I discussed EPAT versus surgical considerations which could include a haglunds resection with detatchment and reattachment of the achilles tendon.  I have referred her to Dr. Milinda Pointer for a pre operative consult as she is interested in having surgery.  I printed out information for post operative considerations following achilles tendon surgery discussing that it goes over some of the considerations such as non weight bearing she will need to prepare for- she will follow up with Dr. Milinda Pointer.

## 2014-10-07 NOTE — Patient Instructions (Signed)
Achilles Tendon Repair  Your Achilles tendon is the strong, rope-like cord of tissue that connects your lower leg muscles to your heel. If the Achilles tendon tears (ruptures), you may have surgery to reconnect the ends of the tendon. This surgery is called Achilles tendon repair.   Achilles tendon repair may be done at a hospital or at an outpatient surgery center. It takes from 30 minutes to 1 hour to complete. Most people who have the surgery are able to go home the same day as the surgery. The surgery is usually successful, but the recovery period can be long.  LET YOUR HEALTH CARE PROVIDER KNOW ABOUT:  · Any allergies you have.  · All medicines you are taking, including vitamins, herbs, eye drops, creams, and over-the-counter medicines.  · Previous problems you or members of your family have had with the use of anesthetics.  · Any blood disorders you have.  · Previous surgeries you have had.  · Medical conditions you have, including any skin conditions you develop before surgery.  RISKS AND COMPLICATIONS  Generally, this is a safe procedure. However, as with any procedure, problems can occur. Possible problems include:  · Reactions to anesthetics.  · Infection.  · Bleeding.  · Delayed healing.  · Scarring.  · Nerve damage causing numbness.  · Re-rupture of your tendon (rare).  BEFORE THE PROCEDURE  · Discuss the risks and benefits of the surgery with your health care provider.  · Ask your health care provider if you can take your regular medicines with a small sip of water on the morning of your surgery.  · Be careful not to injure the skin on your leg.  · Shower or bathe the night before the surgery or the morning of the surgery.  · Do not eat or drink anything for 6-8 hours before your surgery or as directed by your health care provider.  · If you smoke, quit before your surgery. Smoking makes it harder for your body to heal.  PROCEDURE  · A numbing medicine (local anesthetic) will be injected into the area  around your ankle and lower leg.  · Your lower leg will be cleaned and draped for surgery.  · You will be given medicine that makes you sleep (general anesthetic).  · The surgeon will make a cut (incision) over your Achilles tendon.  · The torn ends of your tendon will be stitched back together.  · The incisions will be closed with stitches or staples.  · A bandage will be applied.  AFTER THE PROCEDURE  · After the medicine wears off, you will be taken to the recovery area and monitored closely.  · You will feel some pain when the numbing medicine wears off. Your health care provider will prescribe pain medicine for you to take at home.  · Your leg may be put in a cast or splint.  · Use crutches or another type of walking aid to keep weight off your leg.  · You will be allowed to go home once you are awake, stable, able to drink, and have no other problems.  Document Released: 07/27/2013 Document Reviewed: 07/27/2013  [employer]® Patient Information ©2015 [employer]. This information is not intended to replace advice given to you by your health care provider. Make sure you discuss any questions you have with your health care provider.

## 2014-10-11 ENCOUNTER — Ambulatory Visit (INDEPENDENT_AMBULATORY_CARE_PROVIDER_SITE_OTHER): Payer: BLUE CROSS/BLUE SHIELD | Admitting: Podiatry

## 2014-10-11 ENCOUNTER — Encounter: Payer: Self-pay | Admitting: Podiatry

## 2014-10-11 DIAGNOSIS — M779 Enthesopathy, unspecified: Secondary | ICD-10-CM

## 2014-10-11 DIAGNOSIS — M722 Plantar fascial fibromatosis: Secondary | ICD-10-CM

## 2014-10-11 NOTE — Progress Notes (Signed)
She presents today for evaluation and possible surgical consult regarding her left heel. She states that she is at the point now that she can barely put weight on the left foot and walk normally.  Objective: Vital signs are stable she is alert and oriented 3. She presents today with an antalgic gait left foot. Pulses are strongly palpable. Neurologic sensorium is intact. She has pain on palpation medial calcaneal tubercle of the left heel. An pain on palpation of the Achilles from the posterior inferior insertion site proximally past the watershed area into the proximal Achilles tendon. She has a mild gastroc equinus.  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 Moderate to severe proximal plantar fasciitis left heel.  Plan: We discussed the etiology pathology conservative versus surgical therapies. Due to failure of all conservative therapies of this treatment plan for her left foot we are going to perform an MRI of her left rear foot and ankle. Once her MRI results have been read we will have her in the office for surgical consult.

## 2014-10-11 NOTE — Patient Instructions (Signed)
Pre-Operative Instructions  Congratulations, you have decided to take an important step to improving your quality of life.  You can be assured that the doctors of [facility] will be with you every step of the way.  1. Plan to be at the surgery center/hospital at least 1 (one) hour prior to your scheduled time unless otherwise directed by the surgical center/hospital staff.  You must have a responsible adult accompany you, remain during the surgery and drive you home.  Make sure you have directions to the surgical center/hospital and know how to get there on time. 2. For hospital based surgery you will need to obtain a history and physical form from your family physician within 1 month prior to the date of surgery- we will give you a form for you primary physician.  3. We make every effort to accommodate the date you request for surgery.  There are however, times where surgery dates or times have to be moved.  We will contact you as soon as possible if a change in schedule is required.   4. No Aspirin/Ibuprofen for one week before surgery.  If you are on aspirin, any non-steroidal anti-inflammatory medications (Mobic, Aleve, Ibuprofen) you should stop taking it 7 days prior to your surgery.  You make take Tylenol  For pain prior to surgery.  5. Medications- If you are taking daily heart and blood pressure medications, seizure, reflux, allergy, asthma, anxiety, pain or diabetes medications, make sure the surgery center/hospital is aware before the day of surgery so they may notify you which medications to take or avoid the day of surgery. 6. No food or drink after midnight the night before surgery unless directed otherwise by surgical center/hospital staff. 7. No alcoholic beverages 24 hours prior to surgery.  No smoking 24 hours prior to or 24 hours after surgery. 8. Wear loose pants or shorts- loose enough to fit over bandages, boots, and casts. 9. No slip on shoes, sneakers are best. 10. Bring  your boot with you to the surgery center/hospital.  Also bring crutches or a walker if your physician has prescribed it for you.  If you do not have this equipment, it will be provided for you after surgery. 11. If you have not been contracted by the surgery center/hospital by the day before your surgery, call to confirm the date and time of your surgery. 12. Leave-time from work may vary depending on the type of surgery you have.  Appropriate arrangements should be made prior to surgery with your employer. 13. Prescriptions will be provided immediately following surgery by your doctor.  Have these filled as soon as possible after surgery and take the medication as directed. 14. Remove nail polish on the operative foot. 15. Wash the night before surgery.  The night before surgery wash the foot and leg well with the antibacterial soap provided and water paying special attention to beneath the toenails and in between the toes.  Rinse thoroughly with water and dry well with a towel.  Perform this wash unless told not to do so by your physician.  Enclosed: 1 Ice pack (please put in freezer the night before surgery)   1 Hibiclens skin cleaner   Pre-op Instructions  If you have any questions regarding the instructions, do not hesitate to call our office.  [place]: [address] [place], [place] [address] [phone]  [place]: [address] [place], [place] [address] [phone]  [place]: [address]  [place], [place] [address] [phone]  Dr. Richard   Tuchman DPM, Dr. Norman Regal DPM Dr. Richard Sikora DPM, Dr. M. Todd Hyatt DPM, Dr. Kathryn Egerton DPM 

## 2014-10-13 ENCOUNTER — Telehealth: Payer: Self-pay | Admitting: *Deleted

## 2014-10-13 NOTE — Telephone Encounter (Signed)
I faxed authorization to [facility].  MRI was authorized from 10/13/2014 to 11/11/2014.  [place] order number is [id number].

## 2014-10-13 NOTE — Telephone Encounter (Signed)
Patient is scheduled for a MRI on tomorrow it needs authorization.  The procedure code is 73721, MRI of Ankle.

## 2014-10-14 ENCOUNTER — Ambulatory Visit
Admission: RE | Admit: 2014-10-14 | Discharge: 2014-10-14 | Disposition: A | Discharge status: Home / Self Care | Payer: BLUE CROSS/BLUE SHIELD | Source: Ambulatory Visit | Attending: Podiatry | Admitting: Podiatry

## 2014-10-14 DIAGNOSIS — M722 Plantar fascial fibromatosis: Secondary | ICD-10-CM

## 2014-10-14 DIAGNOSIS — M779 Enthesopathy, unspecified: Secondary | ICD-10-CM

## 2014-10-17 NOTE — Progress Notes (Signed)
Patient already has an appointment scheduled to discuss results on 3/15 at 3:30pm

## 2014-10-18 ENCOUNTER — Encounter: Payer: Self-pay | Admitting: Podiatry

## 2014-10-18 ENCOUNTER — Ambulatory Visit (INDEPENDENT_AMBULATORY_CARE_PROVIDER_SITE_OTHER): Payer: BLUE CROSS/BLUE SHIELD | Admitting: Podiatry

## 2014-10-18 VITALS — BP 119/78 | HR 98 | Resp 16

## 2014-10-18 DIAGNOSIS — M722 Plantar fascial fibromatosis: Secondary | ICD-10-CM | POA: Diagnosis not present

## 2014-10-18 NOTE — Progress Notes (Signed)
She presents today for her MRI results. She still complaining of pain to her left heel plantarly and posteriorly.  Objective: Vital signs are stable she is alert and oriented 3. She has pain on palpation medial calcaneal tubercle of her left heel and tenderness on palpation posterior aspect of the calcaneus. MRI is suggestive of Achilles tendinitis and chronic proximal plantar fasciitis medial band.  Assessment: Plantar fasciitis left foot. Achilles tendinitis left foot.  Plan: We went over a consent form today 1 Number by number giving her ample time to ask questions she saw fit regarding an endoscopic plantar fasciotomy of her left foot and an injection of her Achilles tendon left heel with dexamethasone. I answered all the questions regarding these procedures to the best of my ability in layman's terms. She understands she will be non-ambulatory in a cam boot for the first 2 weeks will then be placed back into her tennis shoes and will continue the use of either the cam boot or the night splints during the night for 1 month. She understands that by doing surgery this Friday she will follow up with Dr. Earleen Newport for her first and second postop dressing changes. We did discuss the possible postop complications which may include but are not limited to postop pain bleeding swelling infection recurrence need for further surgery also did have loss of limb loss of life. She understands that we may need to eventually perform a procedure to the posterior aspect of her calcaneus to repair her Achilles tendon. She understands that this will be a much longer endeavor and she wants to wait until fall to do this.

## 2014-10-18 NOTE — Patient Instructions (Signed)
Pre-Operative Instructions  Congratulations, you have decided to take an important step to improving your quality of life.  You can be assured that the doctors of [facility] will be with you every step of the way.  1. Plan to be at the surgery center/hospital at least 1 (one) hour prior to your scheduled time unless otherwise directed by the surgical center/hospital staff.  You must have a responsible adult accompany you, remain during the surgery and drive you home.  Make sure you have directions to the surgical center/hospital and know how to get there on time. 2. For hospital based surgery you will need to obtain a history and physical form from your family physician within 1 month prior to the date of surgery- we will give you a form for you primary physician.  3. We make every effort to accommodate the date you request for surgery.  There are however, times where surgery dates or times have to be moved.  We will contact you as soon as possible if a change in schedule is required.   4. No Aspirin/Ibuprofen for one week before surgery.  If you are on aspirin, any non-steroidal anti-inflammatory medications (Mobic, Aleve, Ibuprofen) you should stop taking it 7 days prior to your surgery.  You make take Tylenol  For pain prior to surgery.  5. Medications- If you are taking daily heart and blood pressure medications, seizure, reflux, allergy, asthma, anxiety, pain or diabetes medications, make sure the surgery center/hospital is aware before the day of surgery so they may notify you which medications to take or avoid the day of surgery. 6. No food or drink after midnight the night before surgery unless directed otherwise by surgical center/hospital staff. 7. No alcoholic beverages 24 hours prior to surgery.  No smoking 24 hours prior to or 24 hours after surgery. 8. Wear loose pants or shorts- loose enough to fit over bandages, boots, and casts. 9. No slip on shoes, sneakers are best. 10. Bring  your boot with you to the surgery center/hospital.  Also bring crutches or a walker if your physician has prescribed it for you.  If you do not have this equipment, it will be provided for you after surgery. 11. If you have not been contracted by the surgery center/hospital by the day before your surgery, call to confirm the date and time of your surgery. 12. Leave-time from work may vary depending on the type of surgery you have.  Appropriate arrangements should be made prior to surgery with your employer. 13. Prescriptions will be provided immediately following surgery by your doctor.  Have these filled as soon as possible after surgery and take the medication as directed. 14. Remove nail polish on the operative foot. 15. Wash the night before surgery.  The night before surgery wash the foot and leg well with the antibacterial soap provided and water paying special attention to beneath the toenails and in between the toes.  Rinse thoroughly with water and dry well with a towel.  Perform this wash unless told not to do so by your physician.  Enclosed: 1 Ice pack (please put in freezer the night before surgery)   1 Hibiclens skin cleaner   Pre-op Instructions  If you have any questions regarding the instructions, do not hesitate to call our office.  [place]: [address] [place], [place] [address] [phone]  [place]: [address] [place], [place] [address] [phone]  [place]: [address]  [place], [place] [address] [phone]  Dr. Richard   Tuchman DPM, Dr. Norman Regal DPM Dr. Richard Sikora DPM, Dr. M. Todd Hyatt DPM, Dr. Kathryn Egerton DPM 

## 2014-10-19 ENCOUNTER — Other Ambulatory Visit: Payer: Self-pay | Admitting: Podiatry

## 2014-10-19 MED ORDER — MEPERIDINE HCL 50 MG PO TABS: ORAL_TABLET | ORAL | Status: DC

## 2014-10-19 MED ORDER — PROMETHAZINE HCL 25 MG PO TABS: 25.0000 mg | ORAL_TABLET | Freq: Three times a day (TID) | ORAL | Status: DC | PRN

## 2014-10-19 MED ORDER — CEPHALEXIN 500 MG PO CAPS: 500.0000 mg | ORAL_CAPSULE | Freq: Three times a day (TID) | ORAL | Status: DC

## 2014-10-21 ENCOUNTER — Encounter: Payer: Self-pay | Admitting: Podiatry

## 2014-10-21 DIAGNOSIS — M722 Plantar fascial fibromatosis: Secondary | ICD-10-CM | POA: Diagnosis not present

## 2014-10-26 NOTE — Progress Notes (Signed)
DOS 10/21/2014 Left Endoscopic Plantar Fasciotomy.

## 2014-10-27 ENCOUNTER — Encounter: Payer: Self-pay | Admitting: Podiatry

## 2014-10-27 ENCOUNTER — Ambulatory Visit (INDEPENDENT_AMBULATORY_CARE_PROVIDER_SITE_OTHER): Payer: BLUE CROSS/BLUE SHIELD | Admitting: Podiatry

## 2014-10-27 VITALS — BP 137/78 | HR 88 | Resp 13

## 2014-10-27 DIAGNOSIS — M722 Plantar fascial fibromatosis: Secondary | ICD-10-CM | POA: Diagnosis not present

## 2014-10-27 DIAGNOSIS — Z9889 Other specified postprocedural states: Secondary | ICD-10-CM

## 2014-10-27 NOTE — Progress Notes (Signed)
She presents today 1 week status post endoscopic plantar fasciotomy left heel with injection of dexamethasone insertion site of the Achilles heel. She states that the foot is very tender to walk on and tender to touch. She denies fever chills nausea vomiting muscle aches or pain and trauma.  Objective: Vital signs are stable alert and oriented 3. Dry sterile dressing removed without blood no signs of purulence or malodor. She has ecchymosis plantar aspect of the foot along the medial longitudinal arch from the incision site medially. She has tenderness on palpation of the posterior aspect of the Achilles at its insertion site. The Achilles is intact.  Assessment: Status post EPF left.  Plan: Redressed today with 2 Band-Aids and a compression anklet. Encouraged her to soak the foot after showering with Epsom salts and warm water. I will allow her some Liberty's ambulating in the boot. She will follow up with Dr. Earleen Newport in 1 week for suture removal. If her foot is still extremely painful he may suggest contrast baths. She should continue at the very least to use her night splint. If she is markedly improved with no pain to her foot and she may try tennis shoes. Otherwise she is to maintain the use of the [employer].

## 2014-11-01 ENCOUNTER — Other Ambulatory Visit: Payer: Self-pay | Admitting: Physician Assistant

## 2014-11-02 NOTE — Telephone Encounter (Signed)
Spoke with Pt. She will contact office to schedule follow up appt.

## 2014-11-04 ENCOUNTER — Ambulatory Visit (INDEPENDENT_AMBULATORY_CARE_PROVIDER_SITE_OTHER): Payer: BLUE CROSS/BLUE SHIELD | Admitting: Podiatry

## 2014-11-04 ENCOUNTER — Encounter: Payer: Self-pay | Admitting: Podiatry

## 2014-11-04 VITALS — BP 117/81 | HR 99 | Resp 12

## 2014-11-04 DIAGNOSIS — Z9889 Other specified postprocedural states: Secondary | ICD-10-CM

## 2014-11-04 DIAGNOSIS — M722 Plantar fascial fibromatosis: Secondary | ICD-10-CM

## 2014-11-04 NOTE — Progress Notes (Signed)
   Subjective:    Patient ID: Tracy Landry, female    DOB: [birth year], 54 y.o.   MRN: [id number]  HPI Comments: DOS 10/21/2014 left Endoscopic Plantar Fasciotomy  54 year old female presents the office today status post left EPF states that she is doing "so much better". She states that her pain is controlled and is not requiring pain medication. She denies any systemic complaints as fevers, chills, nausea, vomiting. She has been continue with the Cam Walker. No other complaints at this time in no acute changes since last appointment.    Review of Systems  All other systems reviewed and are negative.      Objective:   Physical Exam AAO 3, NAD DP/PT pulses palpable, CRT less than 3 seconds Protective sensation intact with Simms Weinstein monofilament, Achilles tendon reflex intact Incisions are well coapted without any evidence of dehiscence and sutures are intact. There is no swelling erythema, ascending cellulitis, flexion, crepitus, malodor, or any other clinical signs of infection. There is no tenderness palpation along the surgical sites. There are no areas of tenderness to bilateral lower extremities. No overlying edema, erythema, increase in warmth. No open lesions or pre-ulcer lesions identified bilaterally. No pain with calf compression, swelling, warmth, erythema.     Assessment & Plan:  54 year old female status post left EPF, doing well -Sutures were removed without complications. Antibiotic ointment and a Band-Aid was applied. She can continue doing this at home as well. -As her symptoms have greatly improved she consented to transition into a regular shoe as tolerated. Discussed with her shoes to wear either the night splint of the Cam Walker at night. She would like to try the night splint. This was dispensed to her today. If she has any increase in pain while wearing a regular shoe to return to the [employer] and call the office. -Follow-up in 4 weeks or sooner if any  problems are to arise. In the meantime encouraged to call the office with any questions, concerns, change in symptoms.

## 2014-11-04 NOTE — Patient Instructions (Signed)
You may return to a regular shoe as tolerated. If there is any pain return to the CAM boot and call the office.  Wear either the night splint or CAM boot at night.

## 2014-11-29 ENCOUNTER — Encounter: Payer: Self-pay | Admitting: Podiatry

## 2014-11-29 ENCOUNTER — Ambulatory Visit (INDEPENDENT_AMBULATORY_CARE_PROVIDER_SITE_OTHER): Payer: BLUE CROSS/BLUE SHIELD | Admitting: Podiatry

## 2014-11-29 VITALS — BP 104/65 | HR 119 | Resp 16

## 2014-11-29 DIAGNOSIS — M722 Plantar fascial fibromatosis: Secondary | ICD-10-CM

## 2014-11-29 DIAGNOSIS — Z9889 Other specified postprocedural states: Secondary | ICD-10-CM

## 2014-11-30 ENCOUNTER — Encounter: Payer: BLUE CROSS/BLUE SHIELD | Admitting: Podiatry

## 2014-11-30 NOTE — Progress Notes (Signed)
She presents today about a months status post EPF left foot she states that she is back to work and doing very well.  Objective: Vital signs are stable she is alert and oriented 3. She has no pain on palpation to the surgical foot or surgical site.  Assessment: Well-healing endoscopic plantar fasciotomy left foot.  Plan: Encouraged her to continue massage therapy and to continue the use of the night splints 1 month and I will follow-up with her at that time.

## 2014-12-01 ENCOUNTER — Other Ambulatory Visit: Payer: Self-pay | Admitting: Physician Assistant

## 2014-12-26 ENCOUNTER — Telehealth: Payer: Self-pay | Admitting: *Deleted

## 2014-12-26 NOTE — Telephone Encounter (Addendum)
Pt states at last office visit with Dr. Milinda Pointer for her post-op EPF, he told her if she was doing good, she could cancel the next appt.  I left a message informing pt Dr. Marta Antu to cancel 12/27/2014 appt.

## 2014-12-26 NOTE — Telephone Encounter (Signed)
Ok to cancel appointment if she is doing better.

## 2014-12-27 ENCOUNTER — Encounter: Payer: BLUE CROSS/BLUE SHIELD | Admitting: Podiatry

## 2014-12-29 ENCOUNTER — Other Ambulatory Visit: Payer: Self-pay | Admitting: Physician Assistant

## 2015-01-02 ENCOUNTER — Other Ambulatory Visit: Payer: Self-pay | Admitting: Physician Assistant

## 2015-01-03 ENCOUNTER — Other Ambulatory Visit: Payer: Self-pay

## 2015-01-03 MED ORDER — LOSARTAN POTASSIUM-HCTZ 50-12.5 MG PO TABS: 1.0000 | ORAL_TABLET | Freq: Every day | ORAL | Status: DC

## 2015-01-03 MED ORDER — DEXILANT 60 MG PO CPDR: 60.0000 mg | DELAYED_RELEASE_CAPSULE | Freq: Every day | ORAL | Status: DC

## 2015-01-17 ENCOUNTER — Ambulatory Visit (INDEPENDENT_AMBULATORY_CARE_PROVIDER_SITE_OTHER): Payer: BLUE CROSS/BLUE SHIELD | Admitting: Podiatry

## 2015-01-17 ENCOUNTER — Encounter: Payer: Self-pay | Admitting: Podiatry

## 2015-01-17 DIAGNOSIS — M722 Plantar fascial fibromatosis: Secondary | ICD-10-CM | POA: Diagnosis not present

## 2015-01-17 DIAGNOSIS — D3610 Benign neoplasm of peripheral nerves and autonomic nervous system, unspecified: Secondary | ICD-10-CM

## 2015-01-17 DIAGNOSIS — Z9889 Other specified postprocedural states: Secondary | ICD-10-CM

## 2015-01-17 NOTE — Progress Notes (Signed)
She presents today regarding her surgical foot. She states that the heel is started herniating the outside of the foot is hurting particularly overlying the neuroma.  Objective: Vital signs are stable she is alert and oriented 3. Pulses are palpable left foot. She has pain on palpation medial calcaneal tubercle feels not related much like a scar. She also has a palpable neuroma with operable Mulder's click to the third interdigital space of the left foot.  Assessment: Status post EPF left foot with scar tissue. Plantar fasciitis. 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  Plan: Injected both areas today with Kenalog and local anesthesia. Started her on a Medrol Dosepak and I will follow-up with her in 1 month sooner if needed.

## 2015-01-19 ENCOUNTER — Other Ambulatory Visit: Payer: Self-pay | Admitting: Podiatry

## 2015-01-19 ENCOUNTER — Telehealth: Payer: Self-pay | Admitting: *Deleted

## 2015-01-19 MED ORDER — METHYLPREDNISOLONE 4 MG PO TBPK: ORAL_TABLET | ORAL | Status: DC

## 2015-01-19 NOTE — Telephone Encounter (Signed)
Pt states the antiinflammatory was not sent to her pharmacy.  Dr. Milinda Pointer ordered Medrol dose pack to the [facility] in [place].  I informed pt.

## 2015-01-30 ENCOUNTER — Other Ambulatory Visit: Payer: Self-pay | Admitting: Family Medicine

## 2015-02-07 ENCOUNTER — Encounter: Payer: Self-pay | Admitting: Podiatry

## 2015-02-07 ENCOUNTER — Ambulatory Visit (INDEPENDENT_AMBULATORY_CARE_PROVIDER_SITE_OTHER): Payer: BLUE CROSS/BLUE SHIELD | Admitting: Podiatry

## 2015-02-07 VITALS — BP 114/77 | HR 86 | Resp 16

## 2015-02-07 DIAGNOSIS — M722 Plantar fascial fibromatosis: Secondary | ICD-10-CM | POA: Diagnosis not present

## 2015-02-07 DIAGNOSIS — Z9889 Other specified postprocedural states: Secondary | ICD-10-CM

## 2015-02-07 NOTE — Progress Notes (Signed)
She presents today states that she is doing 100% better she says that she has no pain on the distal lateral aspect of the foot nor to the medial aspect of the left heel.  Objective: Vital signs are stable she is alert and oriented 3. Pulses are palpable. No pain on palpation of either the medial heel or the distal lateral left foot.  Assessment well healing surgical foot after a mild setback.  Plan: Continue anti-inflammatory and follow up with me as needed.

## 2015-02-28 ENCOUNTER — Other Ambulatory Visit: Payer: Self-pay | Admitting: Physician Assistant

## 2015-03-06 ENCOUNTER — Other Ambulatory Visit: Payer: Self-pay | Admitting: Physician Assistant

## 2015-03-27 ENCOUNTER — Encounter: Payer: Self-pay | Admitting: Physician Assistant

## 2015-03-27 ENCOUNTER — Ambulatory Visit (INDEPENDENT_AMBULATORY_CARE_PROVIDER_SITE_OTHER): Payer: BLUE CROSS/BLUE SHIELD | Admitting: Physician Assistant

## 2015-03-27 VITALS — BP 119/70 | HR 90 | Ht 64.0 in | Wt 215.0 lb

## 2015-03-27 DIAGNOSIS — I471 Supraventricular tachycardia: Secondary | ICD-10-CM | POA: Diagnosis not present

## 2015-03-27 DIAGNOSIS — K2100 Gastro-esophageal reflux disease with esophagitis, without bleeding: Secondary | ICD-10-CM

## 2015-03-27 DIAGNOSIS — Z23 Encounter for immunization: Secondary | ICD-10-CM | POA: Diagnosis not present

## 2015-03-27 DIAGNOSIS — F411 Generalized anxiety disorder: Secondary | ICD-10-CM

## 2015-03-27 DIAGNOSIS — I10 Essential (primary) hypertension: Secondary | ICD-10-CM | POA: Diagnosis not present

## 2015-03-27 MED ORDER — CYCLOBENZAPRINE HCL 10 MG PO TABS: 10.0000 mg | ORAL_TABLET | Freq: Every day | ORAL | Status: DC

## 2015-03-27 MED ORDER — DEXILANT 60 MG PO CPDR: 60.0000 mg | DELAYED_RELEASE_CAPSULE | Freq: Every day | ORAL | Status: DC

## 2015-03-27 MED ORDER — LOSARTAN POTASSIUM-HCTZ 50-12.5 MG PO TABS: 1.0000 | ORAL_TABLET | Freq: Every day | ORAL | Status: DC

## 2015-03-28 ENCOUNTER — Encounter: Payer: Self-pay | Admitting: Physician Assistant

## 2015-03-28 DIAGNOSIS — F411 Generalized anxiety disorder: Secondary | ICD-10-CM | POA: Insufficient documentation

## 2015-03-28 DIAGNOSIS — F419 Anxiety disorder, unspecified: Secondary | ICD-10-CM | POA: Insufficient documentation

## 2015-03-28 NOTE — Progress Notes (Signed)
   Subjective:    Patient ID: Tracy Landry, female    DOB: Jun 14, 1961, 54 y.o.   MRN: [id number]  HPI Pt presents to the clinic to follow up on HTN and SVT. Pt has been released from cardiology, Dr. Stanford Breed after ablation. She is only on hyzaar. She denies any palpitations, headaches, SOB, chest pain, dizziness. 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   GERD- needs refill. If goes more than 2 days without taking has severe acid reflux.   Her OB/GYN is tapering her off effexor and she is doing well.    Review of Systems  All other systems reviewed and are negative.      Objective:   Physical Exam  Constitutional: She is oriented to person, place, and time. She appears well-developed and well-nourished.  HENT:  Head: Normocephalic and atraumatic.  Cardiovascular: Regular rhythm and normal heart sounds.   106 orginally. Recheck at 90 after resting in exam room.   Pulmonary/Chest: Effort normal and breath sounds normal.  Neurological: She is alert and oriented to person, place, and time.  Psychiatric: She has a normal mood and affect. Her behavior is normal.          Assessment & Plan:  HTN/SVT- looks great today. Refilled hyzaar for 6 months.   GERD- refilled dexilant. Discussed new link to dementia. Encouraged use as little as possible to control symptoms. Encouraged zantac 150mg  bid in combination.   Anxiety- pt seems to be doing well tapering off effexor.

## 2015-04-04 ENCOUNTER — Other Ambulatory Visit: Payer: Self-pay | Admitting: Physician Assistant

## 2015-05-06 ENCOUNTER — Other Ambulatory Visit: Payer: Self-pay | Admitting: Physician Assistant

## 2015-05-11 ENCOUNTER — Encounter: Payer: Self-pay | Admitting: Podiatry

## 2015-05-11 ENCOUNTER — Ambulatory Visit (INDEPENDENT_AMBULATORY_CARE_PROVIDER_SITE_OTHER): Payer: BLUE CROSS/BLUE SHIELD | Admitting: Podiatry

## 2015-05-11 VITALS — BP 138/82 | HR 91 | Resp 16

## 2015-05-11 DIAGNOSIS — G5762 Lesion of plantar nerve, left lower limb: Secondary | ICD-10-CM

## 2015-05-11 DIAGNOSIS — D3610 Benign neoplasm of peripheral nerves and autonomic nervous system, unspecified: Secondary | ICD-10-CM

## 2015-05-11 MED ORDER — TRAMADOL HCL 50 MG PO TABS: ORAL_TABLET | ORAL | Status: DC

## 2015-05-11 NOTE — Progress Notes (Signed)
She presents today for follow-up of her neuroma third interdigital space of the left foot. She states that this thing is killing me and I cannot work like this. She was to have surgery to remove the neuroma.  Objective: Vital signs are stable alert and oriented 3. Pulses are strongly palpable. Palpable Mulder's click there are interdigital space left foot. No erythema edema saline as drainage or odor orthopedic evaluation demonstrates all joints distal to the ankle range of motion without crepitation. Previous EPF still decreases pain to the medial longitudinal arch.  Assessment: Neuroma third interdigital space left foot.  Plan: Discussed etiology pathology conservative versus surgical therapies. I injected the third interdigital space today with Kenalog and local and aesthetic to alleviate her symptoms. We signed a consent form today in line by line number by number giving her ample time to ask question she saw fit regarding a excision neuroma/neurectomy third interdigital space left foot. I answered all the questions regarding this procedure to the best of my ability in layman's terms. He understood it was amenable to it and signed operative patient of the consent form.  Tracy Landry DPM

## 2015-05-16 ENCOUNTER — Ambulatory Visit: Payer: BLUE CROSS/BLUE SHIELD | Admitting: Podiatry

## 2015-06-15 ENCOUNTER — Other Ambulatory Visit: Payer: Self-pay | Admitting: Podiatry

## 2015-06-15 DIAGNOSIS — G5760 Lesion of plantar nerve, unspecified lower limb: Secondary | ICD-10-CM | POA: Diagnosis not present

## 2015-06-15 MED ORDER — CEPHALEXIN 500 MG PO CAPS: 500.0000 mg | ORAL_CAPSULE | Freq: Three times a day (TID) | ORAL | Status: DC

## 2015-06-15 MED ORDER — PROMETHAZINE HCL 25 MG PO TABS
25.0000 mg | ORAL_TABLET | Freq: Three times a day (TID) | ORAL | Status: DC | PRN
Start: 2015-06-15 — End: 2016-01-23

## 2015-06-15 MED ORDER — TRAMADOL HCL 50 MG PO TABS: ORAL_TABLET | ORAL | Status: DC

## 2015-06-23 ENCOUNTER — Ambulatory Visit (INDEPENDENT_AMBULATORY_CARE_PROVIDER_SITE_OTHER): Payer: BLUE CROSS/BLUE SHIELD | Admitting: Podiatry

## 2015-06-23 VITALS — BP 134/82 | HR 73 | Temp 97.7°F | Resp 16

## 2015-06-23 DIAGNOSIS — D3610 Benign neoplasm of peripheral nerves and autonomic nervous system, unspecified: Secondary | ICD-10-CM

## 2015-06-24 NOTE — Progress Notes (Signed)
Subjective:     Patient ID: Tracy Landry, female   DOB: [birth year], 54 y.o.   MRN: [id number]  HPI patient states that I'm doing well with my left foot with mild edema in discomfort if I walk too much   Review of Systems     Objective:   Physical Exam Neurovascular status intact muscle strength adequate negative Homans sign noted with well coapted incision site third interspace left foot with wound edges intact and no drainage noted    Assessment:     Doing well post neurectomy left third interspace    Plan:     Advised on continued immobilization and surgical shoe usage. Reapplied sterile dressing and reappoint 2 weeks for suture removal or earlier if any issues should occur

## 2015-07-04 ENCOUNTER — Ambulatory Visit (INDEPENDENT_AMBULATORY_CARE_PROVIDER_SITE_OTHER): Payer: BLUE CROSS/BLUE SHIELD | Admitting: Podiatry

## 2015-07-04 ENCOUNTER — Encounter: Payer: Self-pay | Admitting: Podiatry

## 2015-07-04 ENCOUNTER — Encounter: Payer: Self-pay | Admitting: Nurse Practitioner

## 2015-07-04 ENCOUNTER — Ambulatory Visit (INDEPENDENT_AMBULATORY_CARE_PROVIDER_SITE_OTHER): Payer: BLUE CROSS/BLUE SHIELD | Admitting: Nurse Practitioner

## 2015-07-04 VITALS — BP 113/78 | HR 90 | Resp 16

## 2015-07-04 VITALS — BP 126/72 | HR 78 | Resp 18 | Ht 65.0 in | Wt 214.6 lb

## 2015-07-04 DIAGNOSIS — Z86010 Personal history of colonic polyps: Secondary | ICD-10-CM | POA: Diagnosis not present

## 2015-07-04 DIAGNOSIS — F4321 Adjustment disorder with depressed mood: Secondary | ICD-10-CM | POA: Diagnosis not present

## 2015-07-04 DIAGNOSIS — D3610 Benign neoplasm of peripheral nerves and autonomic nervous system, unspecified: Secondary | ICD-10-CM

## 2015-07-04 DIAGNOSIS — Z01419 Encounter for gynecological examination (general) (routine) without abnormal findings: Secondary | ICD-10-CM

## 2015-07-04 DIAGNOSIS — Z0000 Encounter for general adult medical examination without abnormal findings: Secondary | ICD-10-CM | POA: Diagnosis not present

## 2015-07-04 DIAGNOSIS — Z9889 Other specified postprocedural states: Secondary | ICD-10-CM

## 2015-07-04 LAB — LIPID PANEL
CHOLESTEROL: 268 mg/dL — AB (ref 125–200)
HDL: 56 mg/dL (ref >=46)
LDL Cholesterol: 183 mg/dL — ABNORMAL HIGH (ref <130)
Total CHOL/HDL Ratio: 4.8 Ratio (ref <=5.0)
Triglycerides: 143 mg/dL (ref <150)
VLDL: 29 mg/dL (ref <30)

## 2015-07-04 MED ORDER — VENLAFAXINE HCL ER 37.5 MG PO CP24: ORAL_CAPSULE | ORAL | Status: DC

## 2015-07-04 NOTE — Progress Notes (Signed)
She presents today for follow-up of excision neuroma third interdigital space of the left foot. She is status post 2 weeks now. She presents in her [employer] shoe.  Objective: Vital signs stable alert and oriented 3. Pulses are strongly palpable. Neurologic sensorium is intact she has some tingling around the surgical site and is painful on the plantar aspect of the foot. No signs of infection. Sutures remain well coapted.  Assessment: Well-healing surgical foot sutures were removed today.  Plan: Sutures were removed today.  Her compression anklet she walked out in her tennis shoe when I did suggest that she wear her [employer] shoe. I will follow-up with her in 2 weeks or so.  Roselind Messier DPM

## 2015-07-04 NOTE — Progress Notes (Signed)
54 y.o. G0P0 Married (partner is Williemae Area) Caucasian Fe here for annual exam. S/P left neuroma and plantar fascitis surgery 06/15/15.   She is doing well, still some bruising. She denies any vaso symptoms.  She is now trying to come off Effexor -down to 37.5 mg daily.  Will try to taper slowly over several months as her goal is to come off.  She does have concerns about partner who has polycytic kidneys and recent hospitalization.  Patient's last menstrual period was 10/28/2006.          Sexually active: Yes.    The current method of family planning is Hysterectomy    Exercising: No.   Smoker:  no  Health Maintenance: Pap:  08/07/2006-Negative MMG: 07/06/2014 BI-RADS Category 1:Negative - will schedule 12/16 Colonoscopy:  05/08/2012 F/U in 10 years  BMD:   10/31/10 Normal TDaP:  10/20/2013 Labs: Pt states she will get them at PCP. But if needed, she is willing to get them drawn today.    reports that she has never smoked. She has never used smokeless tobacco. She reports that she drinks alcohol. She reports that she does not use illicit drugs.  Past Medical History  Diagnosis Date  . GERD (gastroesophageal reflux disease)   . Asthma   . Depression   . Back pain   . Hypertension   . 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  . Wears glasses     Past Surgical History  Procedure Laterality Date  . Abdominal hysterectomy  3/09  . Right arm reconstruction  1/01  . Foot surgery  2000    rt foot fx  . Appendectomy    . Ablation of dysrhythmic focus  10/07/2012    SVT  . Hardware removal  2003    rt arm  . Mass excision Left 07/07/2013    Procedure: EXCISION MASS LEFT RING FINGER;  Surgeon: Wynonia Sours, MD;  Location: [facility];  Service: Orthopedics;  Laterality: Left;  . Repair extensor tendon Left 07/07/2013    Procedure: REPAIR EXTENSOR TENDON LEFT WITH DEBRIDMENT PROXIMAL INTERPHALANGEAL JOINT;  Surgeon: Wynonia Sours, MD;   Location: [facility];  Service: Orthopedics;  Laterality: Left;  . Supraventricular tachycardia ablation N/A 10/07/2012    Procedure: SUPRAVENTRICULAR TACHYCARDIA ABLATION;  Surgeon: Evans Lance, MD;  Location: [facility] CATH LAB;  Service: Cardiovascular;  Laterality: N/A;    Current Outpatient Prescriptions  Medication Sig Dispense Refill  . albuterol (PROVENTIL HFA;VENTOLIN HFA) 108 (90 BASE) MCG/ACT inhaler Inhale 2 puffs into the lungs every 6 (six) hours as needed. For wheezing 1 Inhaler 1  . cyclobenzaprine (FLEXERIL) 10 MG tablet Take 1 tablet (10 mg total) by mouth at bedtime. 30 tablet 5  . DEXILANT 60 MG capsule Take 1 capsule (60 mg total) by mouth daily. 30 capsule 11  . diclofenac (VOLTAREN) 75 MG EC tablet TAKE ONE TABLET BY MOUTH ONCE DAILY 30 tablet 5  . losartan-hydrochlorothiazide (HYZAAR) 50-12.5 MG per tablet Take 1 tablet by mouth daily. 90 tablet 1  . montelukast (SINGULAIR) 10 MG tablet Take 1 tablet (10 mg total) by mouth daily with breakfast. 30 tablet 11  . Multiple Vitamins-Minerals (MULTIVITAMIN PO) Take by mouth daily.    . promethazine (PHENERGAN) 25 MG tablet Take 1 tablet (25 mg total) by mouth every 8 (eight) hours as needed for nausea or vomiting. 20 tablet 0  . traMADol (ULTRAM) 50 MG tablet Take to  two by mouth every six to eight hours as needed for pain. 50 tablet 0  . venlafaxine XR (EFFEXOR-XR) 37.5 MG 24 hr capsule TAKE 1 CAPSULES BY MOUTH ONCE DAILY 90 capsule 4   No current facility-administered medications for this visit.    Family History  Problem Relation Age of Onset  . Alcohol abuse Mother   . Diabetes Mother   . Hyperlipidemia Mother   . Hypertension Mother   . Alzheimer's disease Father   . Diabetes Sister   . Hyperlipidemia Sister   . Hypertension Sister   . Heart attack Maternal Uncle   . Cancer Maternal Grandmother     lung  . Heart attack Maternal Grandfather   . Heart disease Mother     Rheumatic disease    ROS:   Pertinent items are noted in HPI.  Otherwise, a comprehensive ROS was negative.  Exam:   BP 126/72 mmHg  Pulse 78  Resp 18  Ht 5\' 5"  (1.651 m)  Wt 214 lb 9.6 oz (97.342 kg)  BMI 35.71 kg/m2  LMP 10/28/2006 Height: 5\' 5"  (165.1 cm) Ht Readings from Last 3 Encounters:  07/04/15 5\' 5"  (1.651 m)  03/27/15 5\' 4"  (1.626 m)  06/23/14 5\' 4"  (1.626 m)    General appearance: alert, cooperative and appears stated age Head: Normocephalic, without obvious abnormality, atraumatic Neck: no adenopathy, supple, symmetrical, trachea midline and thyroid normal to inspection and palpation Lungs: clear to auscultation bilaterally Breasts: normal appearance, no masses or tenderness Heart: regular rate and rhythm Abdomen: soft, non-tender; no masses,  no organomegaly Extremities: extremities normal, atraumatic, no cyanosis or edema Skin: Skin color, texture, turgor normal. No rashes or lesions Lymph nodes: Cervical, supraclavicular, and axillary nodes normal. No abnormal inguinal nodes palpated Neurologic: Grossly normal   Pelvic: External genitalia:  no lesions              Urethra:  normal appearing urethra with no masses, tenderness or lesions              Bartholin's and Skene's: normal                 Vagina: normal appearing vagina with normal color and discharge, no lesions              Cervix: absent              Pap taken: No. Bimanual Exam:  Uterus:  uterus absent              Adnexa: no mass, fullness, tenderness               Rectovaginal: Confirms               Anus:  normal sphincter tone, no lesions  Chaperone present: yes  A:  Well Woman with normal exam  S/P TAH & RSO for endometrial polyp, right OV cyst 10/2006, not on HRT Osteopenia - borderline H/O colonic polyps History of ankylosing Spondylitis History of SVT with cardiac ablation 10/07/12 Situational depression  P:   Reviewed health and wellness pertinent to  exam  Pap smear as above  Mammogram is due 12/16  Refill Effexor 37.5 mg for a year  Will follow with labs - non fasting  Counseled on breast self exam, mammography screening, adequate intake of calcium and vitamin D, diet and exercise return annually or prn  An After Visit Summary was printed and given to the patient.

## 2015-07-04 NOTE — Patient Instructions (Signed)

## 2015-07-04 NOTE — Progress Notes (Signed)
Encounter reviewed by Dr. Brook Amundson C. Silva.  

## 2015-07-05 LAB — HIV ANTIBODY (ROUTINE TESTING W REFLEX): HIV: NONREACTIVE

## 2015-07-05 LAB — VITAMIN D 25 HYDROXY (VIT D DEFICIENCY, FRACTURES): VIT D 25 HYDROXY: 35 ng/mL (ref 30–100)

## 2015-07-05 LAB — HEPATITIS C ANTIBODY: HCV Ab: NEGATIVE

## 2015-07-11 ENCOUNTER — Other Ambulatory Visit: Payer: Self-pay | Admitting: Obstetrics & Gynecology

## 2015-07-11 DIAGNOSIS — Z1231 Encounter for screening mammogram for malignant neoplasm of breast: Secondary | ICD-10-CM

## 2015-07-18 ENCOUNTER — Ambulatory Visit (INDEPENDENT_AMBULATORY_CARE_PROVIDER_SITE_OTHER): Payer: BLUE CROSS/BLUE SHIELD | Admitting: Podiatry

## 2015-07-18 ENCOUNTER — Encounter: Payer: Self-pay | Admitting: Podiatry

## 2015-07-18 VITALS — BP 116/78 | HR 90 | Resp 16

## 2015-07-18 DIAGNOSIS — Z9889 Other specified postprocedural states: Secondary | ICD-10-CM

## 2015-07-18 DIAGNOSIS — D3610 Benign neoplasm of peripheral nerves and autonomic nervous system, unspecified: Secondary | ICD-10-CM

## 2015-07-18 NOTE — Progress Notes (Signed)
She presents today for a follow-up visit 1 month status post neurectomy third interdigital space left foot. She states that she seems to be doing okay other than some soreness.  Objective: Vital signs are stable she is alert and oriented 3 she has minimal tenderness on palpation of the surgical site which is normally heal uneventfully. Skin overlying the area is supple and loose there is some mild tenderness in the plantar surface.  Assessment: Well-healing surgical foot left.  Plan: Follow up with me as needed. She is requesting not to return unless necessary.

## 2015-07-21 ENCOUNTER — Encounter: Payer: Self-pay | Admitting: Podiatry

## 2015-07-28 ENCOUNTER — Ambulatory Visit (INDEPENDENT_AMBULATORY_CARE_PROVIDER_SITE_OTHER): Payer: BLUE CROSS/BLUE SHIELD

## 2015-07-28 DIAGNOSIS — Z1231 Encounter for screening mammogram for malignant neoplasm of breast: Secondary | ICD-10-CM | POA: Diagnosis not present

## 2015-08-02 ENCOUNTER — Telehealth: Payer: Self-pay | Admitting: Nurse Practitioner

## 2015-08-02 ENCOUNTER — Other Ambulatory Visit: Payer: Self-pay | Admitting: Nurse Practitioner

## 2015-08-02 NOTE — Telephone Encounter (Signed)
Medication refill request: Effexor  Last AEX:  07/04/15 PG Next AEX: 07/09/16 PG Last MMG (if hormonal medication request): 08/01/15 BIRADS1:neg Refill authorized: 07/04/15 #90caps/4R. To [facility]

## 2015-08-02 NOTE — Telephone Encounter (Signed)
Tracy Landry with [facility] calling in regards to patient's venlafaxine getting denied. Patient typically takes this 3 times daily but it was sent in for once daily. Pharmacy wanting to know if this a new dosage change and if we could clarify.  [place]: [phone]

## 2015-08-02 NOTE — Telephone Encounter (Signed)
Rx sent to [facility] 07/04/15 #90caps/4Refill. Take 1 capsule by mouth daily.   Patient to take 1 cap a day or 3 caps a day?  Please advise.

## 2015-08-02 NOTE — Telephone Encounter (Signed)
Return call to [place] at [facility].  Advised per office note of 07/04/15, correct dose is 37.5 mg daily.  [facility]le will fill at this dose.  Routing to provider for review.

## 2015-09-13 ENCOUNTER — Encounter: Payer: Self-pay | Admitting: Physician Assistant

## 2015-09-13 ENCOUNTER — Ambulatory Visit (INDEPENDENT_AMBULATORY_CARE_PROVIDER_SITE_OTHER): Payer: BLUE CROSS/BLUE SHIELD | Admitting: Physician Assistant

## 2015-09-13 DIAGNOSIS — T148 Other injury of unspecified body region: Secondary | ICD-10-CM | POA: Diagnosis not present

## 2015-09-13 DIAGNOSIS — T148XXA Other injury of unspecified body region, initial encounter: Secondary | ICD-10-CM | POA: Insufficient documentation

## 2015-09-13 NOTE — Progress Notes (Addendum)
   Subjective:    Patient ID: Tracy Landry, female    DOB: 03/25/1961, 55 y.o.   MRN: [id number]  HPI  Patient is a 55 year old female presenting for basic wound care. Patient is receiving care at [facility] in [place], [place] for management of a puncture wound at the posterior aspect of the left elbow. Patient states that she requires frequent flushes with normal saline and dressing changes as her partner is unable to provide wound care. Patient denies nausea, vomiting, fever or chills. Patient is currently tolerating oral antibiotic regimen well.   Review of Systems   Please see HPI.  Objective:   Physical Exam  Constitutional: She is oriented to person, place, and time. She appears well-developed and well-nourished.  HENT:  Head: Normocephalic and atraumatic.  Eyes: Pupils are equal, round, and reactive to light.  Cardiovascular: Normal rate, regular rhythm, normal heart sounds and intact distal pulses.   Pulmonary/Chest: Effort normal and breath sounds normal.  Musculoskeletal:  Central wound is 1cm with approximately 2 additional centimeters of erythematous scabbing. Wound had some trace pus before cleansing with 250 mL of normal saline and chlorhexidine solution. Wound was covered with orthopedic supplied wound application, a 4x4, and an ACE wrap.   Neurological: She is alert and oriented to person, place, and time.  Skin: Skin is warm and dry.  Psychiatric: She has a normal mood and affect. Her behavior is normal. Judgment and thought content normal.      Assessment & Plan:  1. Puncture Wound See above for wound care. Continue with oral antibiotic. Patient was advised to keep wound dry and covered. Patient has appointment with Ortho PA on 09/18/15. Patient was advised to follow up at our office 09/15/2015.

## 2015-09-15 ENCOUNTER — Ambulatory Visit (INDEPENDENT_AMBULATORY_CARE_PROVIDER_SITE_OTHER): Payer: BLUE CROSS/BLUE SHIELD | Admitting: Physician Assistant

## 2015-09-15 DIAGNOSIS — T148 Other injury of unspecified body region: Secondary | ICD-10-CM | POA: Diagnosis not present

## 2015-09-15 DIAGNOSIS — T148XXA Other injury of unspecified body region, initial encounter: Secondary | ICD-10-CM

## 2015-09-15 NOTE — Progress Notes (Signed)
   Subjective:    Patient ID: Tracy Landry, female    DOB: 1961-03-17, 55 y.o.   MRN: [id number] Pt here today for a dressing change to her left elbow wound.  PA student Kern Reap did the dressing change.  Beatris Ship, CMA HPI    Review of Systems     Objective:   Physical Exam  Skin:  No debridement or saline irrigation needed today. Previous puncture wound skin new skin has grown in place and new pink skin around circumference. Wound size is now .75cm wide and 57mm depth. No warmth to touch. Mild swelling and tenderness to palpation.   Protein dressing(provided by ortho) was placed over wound with non-stick gauze on top. Ace bandage to keep in place.           Assessment & Plan:  Puncture wound- changed and dressed today. Likely the last time will need dressing change. Should be able to keep covered with dry bandage until fully heals. Follow up with ortho on 2.13.17.  Wound is improving daily with good new tissue around wound. Iran Planas PA-C

## 2015-09-22 ENCOUNTER — Other Ambulatory Visit: Payer: Self-pay | Admitting: Physician Assistant

## 2015-09-25 ENCOUNTER — Encounter: Payer: Self-pay | Admitting: Physician Assistant

## 2015-09-27 ENCOUNTER — Ambulatory Visit: Payer: BLUE CROSS/BLUE SHIELD | Admitting: Physician Assistant

## 2015-10-02 ENCOUNTER — Ambulatory Visit (INDEPENDENT_AMBULATORY_CARE_PROVIDER_SITE_OTHER): Payer: BLUE CROSS/BLUE SHIELD | Admitting: Physician Assistant

## 2015-10-02 ENCOUNTER — Encounter: Payer: Self-pay | Admitting: Physician Assistant

## 2015-10-02 ENCOUNTER — Ambulatory Visit: Payer: BLUE CROSS/BLUE SHIELD | Admitting: Physician Assistant

## 2015-10-02 VITALS — BP 125/76 | HR 106 | Ht 65.0 in | Wt 217.0 lb

## 2015-10-02 DIAGNOSIS — J302 Other seasonal allergic rhinitis: Secondary | ICD-10-CM | POA: Diagnosis not present

## 2015-10-02 DIAGNOSIS — M8580 Other specified disorders of bone density and structure, unspecified site: Secondary | ICD-10-CM

## 2015-10-02 DIAGNOSIS — K2100 Gastro-esophageal reflux disease with esophagitis, without bleeding: Secondary | ICD-10-CM

## 2015-10-02 DIAGNOSIS — I10 Essential (primary) hypertension: Secondary | ICD-10-CM

## 2015-10-02 MED ORDER — CYCLOBENZAPRINE HCL 10 MG PO TABS: 10.0000 mg | ORAL_TABLET | Freq: Every day | ORAL | Status: DC

## 2015-10-02 MED ORDER — LOSARTAN POTASSIUM-HCTZ 50-12.5 MG PO TABS: 1.0000 | ORAL_TABLET | Freq: Every day | ORAL | Status: DC

## 2015-10-02 MED ORDER — MONTELUKAST SODIUM 10 MG PO TABS: ORAL_TABLET | ORAL | Status: DC

## 2015-10-02 MED ORDER — DICLOFENAC SODIUM 75 MG PO TBEC: 75.0000 mg | DELAYED_RELEASE_TABLET | Freq: Two times a day (BID) | ORAL | Status: DC

## 2015-10-02 MED ORDER — DEXILANT 60 MG PO CPDR: 60.0000 mg | DELAYED_RELEASE_CAPSULE | Freq: Every day | ORAL | Status: DC

## 2015-10-02 NOTE — Patient Instructions (Signed)
DEXA 

## 2015-10-03 NOTE — Progress Notes (Signed)
   Subjective:    Patient ID: Tracy Landry, female    DOB: Mar 21, 1961, 55 y.o.   MRN: [id number]  HPI Pt presents to the clinic for 6 month follow up.   HTN- doing well no concerns. No CP, palpitations, SoB, headaches or dizziness.   GERD- dexilant working great.   singulair doing great. Needs refill with spring season starting.    Review of Systems  All other systems reviewed and are negative.      Objective:   Physical Exam  Constitutional: She is oriented to person, place, and time. She appears well-developed and well-nourished.  HENT:  Head: Normocephalic and atraumatic.  Cardiovascular: Normal rate, regular rhythm and normal heart sounds.   Pulmonary/Chest: Effort normal and breath sounds normal.  Neurological: She is alert and oriented to person, place, and time.  Psychiatric: She has a normal mood and affect. Her behavior is normal.          Assessment & Plan:  HTN- hyzaar refilled for 1 year.   GERD- dexilant refilled for 1 year. New card given.   Seasonal Allergies- singulair refilled for 1 year.   Hx of osteopenia- will order dexa especially due to recent fracture.

## 2015-10-04 ENCOUNTER — Ambulatory Visit (INDEPENDENT_AMBULATORY_CARE_PROVIDER_SITE_OTHER): Payer: BLUE CROSS/BLUE SHIELD | Admitting: Physician Assistant

## 2015-10-04 ENCOUNTER — Encounter: Payer: Self-pay | Admitting: Physician Assistant

## 2015-10-04 VITALS — BP 144/77 | HR 80 | Ht 65.0 in | Wt 214.0 lb

## 2015-10-04 DIAGNOSIS — L237 Allergic contact dermatitis due to plants, except food: Secondary | ICD-10-CM | POA: Diagnosis not present

## 2015-10-04 DIAGNOSIS — L259 Unspecified contact dermatitis, unspecified cause: Secondary | ICD-10-CM | POA: Diagnosis not present

## 2015-10-04 MED ORDER — METHYLPREDNISOLONE 4 MG PO TBPK: ORAL_TABLET | ORAL | Status: DC

## 2015-10-04 MED ORDER — METHYLPREDNISOLONE SODIUM SUCC 125 MG IJ SOLR
125.0000 mg | Freq: Once | INTRAMUSCULAR | Status: AC
  Administered 2015-10-04: 125 mg via INTRAMUSCULAR

## 2015-10-04 NOTE — Progress Notes (Addendum)
   Subjective:    Patient ID: Tracy Landry, female    DOB: 1961/03/28, 55 y.o.   MRN: [id number]  HPI  Patient is a 55 year old female that presents with left sided facial and left arm rash following exposure to [facility] yesterday. Patient denies nausea, vomiting, shortness of breath or wheezing. Patient has tried Calamine lotion and benadryl, which has provided her with limited relief.   Review of Systems Please see HPI     Objective:   Physical Exam  Constitutional: She is oriented to person, place, and time. She appears well-developed and well-nourished.  HENT:  Head: Normocephalic and atraumatic.  Eyes: Conjunctivae and EOM are normal. Pupils are equal, round, and reactive to light.  Neck: Normal range of motion. Neck supple.  Cardiovascular: Normal rate, regular rhythm, normal heart sounds and intact distal pulses.   Pulmonary/Chest: Effort normal and breath sounds normal. No respiratory distress. She has no wheezes. She has no rales.  Abdominal: Soft.  Musculoskeletal: Normal range of motion.  Neurological: She is alert and oriented to person, place, and time. She has normal reflexes.  Skin: Skin is warm and dry. Rash noted. No pallor.  Patient has raised erythematous patches localized to the left side of the face, chin and left forearm.   Psychiatric: She has a normal mood and affect. Her behavior is normal. Judgment and thought content normal.      Assessment & Plan:   1. [place] dermatitis Patient exhibits raised, erythematous rash after contact with [facility]. Patient was given an injection of solumedrol of 125mg  IM in the office. Patient was advised to continue using benadryl and calamine lotion as needed. Patient was started on a Medrol dose pack.

## 2015-10-11 ENCOUNTER — Ambulatory Visit (INDEPENDENT_AMBULATORY_CARE_PROVIDER_SITE_OTHER): Payer: BLUE CROSS/BLUE SHIELD

## 2015-10-11 ENCOUNTER — Other Ambulatory Visit: Payer: BLUE CROSS/BLUE SHIELD

## 2015-10-11 DIAGNOSIS — Z13820 Encounter for screening for osteoporosis: Secondary | ICD-10-CM | POA: Diagnosis not present

## 2015-10-15 ENCOUNTER — Other Ambulatory Visit: Payer: Self-pay | Admitting: Physician Assistant

## 2015-10-30 DIAGNOSIS — S52531A Colles' fracture of right radius, initial encounter for closed fracture: Secondary | ICD-10-CM | POA: Insufficient documentation

## 2015-10-30 DIAGNOSIS — R52 Pain, unspecified: Secondary | ICD-10-CM | POA: Insufficient documentation

## 2015-11-20 DIAGNOSIS — S52531D Colles' fracture of right radius, subsequent encounter for closed fracture with routine healing: Secondary | ICD-10-CM | POA: Diagnosis not present

## 2015-12-20 DIAGNOSIS — S52531D Colles' fracture of right radius, subsequent encounter for closed fracture with routine healing: Secondary | ICD-10-CM | POA: Diagnosis not present

## 2015-12-20 DIAGNOSIS — M1811 Unilateral primary osteoarthritis of first carpometacarpal joint, right hand: Secondary | ICD-10-CM | POA: Diagnosis not present

## 2016-01-23 ENCOUNTER — Ambulatory Visit (INDEPENDENT_AMBULATORY_CARE_PROVIDER_SITE_OTHER): Payer: BLUE CROSS/BLUE SHIELD | Admitting: Physician Assistant

## 2016-01-23 ENCOUNTER — Encounter: Payer: Self-pay | Admitting: Physician Assistant

## 2016-01-23 VITALS — BP 128/65 | HR 90 | Temp 98.3°F | Resp 16 | Wt 213.0 lb

## 2016-01-23 DIAGNOSIS — J0100 Acute maxillary sinusitis, unspecified: Secondary | ICD-10-CM

## 2016-01-23 DIAGNOSIS — R05 Cough: Secondary | ICD-10-CM | POA: Diagnosis not present

## 2016-01-23 DIAGNOSIS — R059 Cough, unspecified: Secondary | ICD-10-CM

## 2016-01-23 MED ORDER — AMOXICILLIN-POT CLAVULANATE 875-125 MG PO TABS: 1.0000 | ORAL_TABLET | Freq: Two times a day (BID) | ORAL | Status: DC

## 2016-01-23 MED ORDER — METHYLPREDNISOLONE SODIUM SUCC 125 MG IJ SOLR: 125.0000 mg | Freq: Once | INTRAMUSCULAR | Status: DC

## 2016-01-23 MED ORDER — METHYLPREDNISOLONE SODIUM SUCC 125 MG IJ SOLR
125.0000 mg | Freq: Once | INTRAMUSCULAR | Status: AC
  Administered 2016-01-23: 125 mg via INTRAMUSCULAR

## 2016-01-23 MED ORDER — HYDROCOD POLST-CPM POLST ER 10-8 MG/5ML PO SUER: 5.0000 mL | Freq: Every evening | ORAL | Status: DC | PRN

## 2016-01-23 NOTE — Progress Notes (Signed)
   Subjective:    Patient ID: Tracy Landry, female    DOB: 03-23-1961, 55 y.o.   MRN: [id number]  HPI Patient is a 55 year old female who presents to the clinic with one week of sinus pressure, nasal congestion, cough and episodes of shortness of breath. She works in [employer] and she also was cleaning out an old dusty room this weekend. She denies any fever, chills, body aches. She has not done anything to make better. She has not been using her inhaler. Her cough does seem to be worse at night when she goes to lay down.   Review of Systems  All other systems reviewed and are negative.      Objective:   Physical Exam  Constitutional: She is oriented to person, place, and time. She appears well-developed and well-nourished.  HENT:  Head: Normocephalic and atraumatic.  Right Ear: External ear normal.  Left Ear: External ear normal.  Mouth/Throat: No oropharyngeal exudate.  TM's clear.  Tenderness over maxillary sinuses to palpation.  Bilateral nasal turbinates red and swollen.  PND drip present on oropharynx.   Eyes: Conjunctivae are normal. Right eye exhibits no discharge. Left eye exhibits no discharge.  Neck: Normal range of motion. Neck supple.  Shotty lymph node enlargement.   Cardiovascular: Normal rate, regular rhythm and normal heart sounds.   Pulmonary/Chest: Effort normal and breath sounds normal. She has no wheezes.  Lymphadenopathy:    She has cervical adenopathy.  Neurological: She is alert and oriented to person, place, and time.  Psychiatric: She has a normal mood and affect. Her behavior is normal.          Assessment & Plan:  Acute maxillary sinusitis/cough- augmentin for 10 days given. Solumedrol 125mg  IM given in office today. tussinonex for cough at bedtime. Follow up as needed.

## 2016-02-22 ENCOUNTER — Ambulatory Visit (INDEPENDENT_AMBULATORY_CARE_PROVIDER_SITE_OTHER): Payer: BLUE CROSS/BLUE SHIELD | Admitting: Sports Medicine

## 2016-02-22 ENCOUNTER — Ambulatory Visit (INDEPENDENT_AMBULATORY_CARE_PROVIDER_SITE_OTHER): Payer: BLUE CROSS/BLUE SHIELD

## 2016-02-22 VITALS — BP 117/71 | HR 70 | Resp 16 | Wt 211.0 lb

## 2016-02-22 DIAGNOSIS — M545 Low back pain, unspecified: Secondary | ICD-10-CM

## 2016-02-22 DIAGNOSIS — M5134 Other intervertebral disc degeneration, thoracic region: Secondary | ICD-10-CM | POA: Diagnosis not present

## 2016-02-22 DIAGNOSIS — M4806 Spinal stenosis, lumbar region: Secondary | ICD-10-CM

## 2016-02-22 DIAGNOSIS — M5136 Other intervertebral disc degeneration, lumbar region: Secondary | ICD-10-CM | POA: Diagnosis not present

## 2016-02-22 NOTE — Assessment & Plan Note (Signed)
I questioned the diagnosis of ankylosing spondylitis.  We are going to get lumbar and thoracic spine x-rays, and check a new HLA-B27 as well as other rheumatologic testing. She does need to be negative to be up to donate a kidney to her wife.

## 2016-02-22 NOTE — Progress Notes (Signed)
   Subjective:    I'm seeing this patient as a consultation for:  Iran Planas, PA-C  CC: Question ankylosing spondylitis  HPI: This is a pleasant 55 year old female with a long history of mild back pain, without radiculopathy, axial, worse with sitting, flexion, Valsalva. No bowel or bladder dysfunction, saddle numbness, constitutional symptoms. X-rays almost 2 decades ago showed some spurring in the thoracic spine that was considered suggestive of ankylosing spondylitis. I have no further information. Her back pain has been mild, non-limiting. She needs to be tested and confirmed negative for ankylosing spondylitis before she can donate a kidney to her wife.  Past medical history, Surgical history, Family history not pertinant except as noted below, Social history, Allergies, and medications have been entered into the medical record, reviewed, and no changes needed.   Review of Systems: No headache, visual changes, nausea, vomiting, diarrhea, constipation, dizziness, abdominal pain, skin rash, fevers, chills, night sweats, weight loss, swollen lymph nodes, body aches, joint swelling, muscle aches, chest pain, shortness of breath, mood changes, visual or auditory hallucinations.   Objective:   General: Well Developed, well nourished, and in no acute distress.  Neuro/Psych: Alert and oriented x3, extra-ocular muscles intact, able to move all 4 extremities, sensation grossly intact. Skin: Warm and dry, no rashes noted.  Respiratory: Not using accessory muscles, speaking in full sentences, trachea midline.  Cardiovascular: Pulses palpable, no extremity edema. Abdomen: Does not appear distended. Back Exam:  Inspection: Unremarkable  Motion: Flexion 45 deg, Extension 45 deg, Side Bending to 45 deg bilaterally,  Rotation to 45 deg bilaterally  SLR laying: Negative  XSLR laying: Negative  Palpable tenderness: None. FABER: negative. Sensory change: Gross sensation intact to all lumbar and  sacral dermatomes.  Reflexes: 2+ at both patellar tendons, 2+ at achilles tendons, Babinski's downgoing.  Strength at foot  Plantar-flexion: 5/5 Dorsi-flexion: 5/5 Eversion: 5/5 Inversion: 5/5  Leg strength  Quad: 5/5 Hamstring: 5/5 Hip flexor: 5/5 Hip abductors: 5/5  Gait unremarkable.  Impression and Recommendations:   This case required medical decision making of moderate complexity.

## 2016-02-23 LAB — CYCLIC CITRUL PEPTIDE ANTIBODY, IGG: Cyclic Citrullin Peptide Ab: <16 U

## 2016-02-23 LAB — RHEUMATOID FACTOR: Rheumatoid fact SerPl-aCnc: <10 [IU]/mL (ref <=14)

## 2016-02-23 LAB — SEDIMENTATION RATE: Sed Rate: 22 mm/h (ref 0–30)

## 2016-02-26 ENCOUNTER — Encounter: Payer: Self-pay | Admitting: Sports Medicine

## 2016-03-01 LAB — HLA-B27 ANTIGEN: DNA Result:: POSITIVE — AB

## 2016-04-05 ENCOUNTER — Ambulatory Visit (INDEPENDENT_AMBULATORY_CARE_PROVIDER_SITE_OTHER): Payer: BLUE CROSS/BLUE SHIELD | Admitting: Physician Assistant

## 2016-04-05 VITALS — BP 138/76 | HR 63 | Temp 98.1°F

## 2016-04-05 DIAGNOSIS — Z23 Encounter for immunization: Secondary | ICD-10-CM | POA: Diagnosis not present

## 2016-04-05 NOTE — Progress Notes (Signed)
Spoke with Dr.Metheney about report of reaction to flu vaccination last season with localized redness and edema. She said since there was only localized rxn, patient could have vaccination today. I called and advised patient to take tylenol or ibuprofen before coming for injection. She tolerated the injection well without complications. An ice pack was place on the site prophylacticaly. She knows red flags and to seek help appropriately. pak

## 2016-05-08 ENCOUNTER — Ambulatory Visit (INDEPENDENT_AMBULATORY_CARE_PROVIDER_SITE_OTHER): Payer: BLUE CROSS/BLUE SHIELD | Admitting: Sports Medicine

## 2016-05-08 ENCOUNTER — Ambulatory Visit (INDEPENDENT_AMBULATORY_CARE_PROVIDER_SITE_OTHER): Payer: BLUE CROSS/BLUE SHIELD

## 2016-05-08 ENCOUNTER — Encounter: Payer: Self-pay | Admitting: Sports Medicine

## 2016-05-08 DIAGNOSIS — M1711 Unilateral primary osteoarthritis, right knee: Secondary | ICD-10-CM | POA: Insufficient documentation

## 2016-05-08 DIAGNOSIS — M170 Bilateral primary osteoarthritis of knee: Secondary | ICD-10-CM

## 2016-05-08 DIAGNOSIS — M179 Osteoarthritis of knee, unspecified: Secondary | ICD-10-CM | POA: Diagnosis not present

## 2016-05-08 DIAGNOSIS — M25561 Pain in right knee: Secondary | ICD-10-CM

## 2016-05-08 DIAGNOSIS — M25562 Pain in left knee: Secondary | ICD-10-CM

## 2016-05-08 NOTE — Progress Notes (Signed)
  Subjective:    CC: Bilateral knee pain  HPI:  This is a pleasant 55 year old female, she has bilateral anterior knee pain, with gelling, worse going up and down stairs, pain is moderate, persistent without radiation. Desires her bilateral interventional treatment today.  Past medical history:  Negative.  See flowsheet/record as well for more information.  Surgical history: Negative.  See flowsheet/record as well for more information.  Family history: Negative.  See flowsheet/record as well for more information.  Social history: Negative.  See flowsheet/record as well for more information.  Allergies, and medications have been entered into the medical record, reviewed, and no changes needed.   Review of Systems: No fevers, chills, night sweats, weight loss, chest pain, or shortness of breath.   Objective:    General: Well Developed, well nourished, and in no acute distress.  Neuro: Alert and oriented x3, extra-ocular muscles intact, sensation grossly intact.  HEENT: Normocephalic, atraumatic, pupils equal round reactive to light, neck supple, no masses, no lymphadenopathy, thyroid nonpalpable.  Skin: Warm and dry, no rashes. Cardiac: Regular rate and rhythm, no murmurs rubs or gallops, no lower extremity edema.  Respiratory: Clear to auscultation bilaterally. Not using accessory muscles, speaking in full sentences. Bilateral knees: Visibly swollen with tenderness around the patellar facets, medial joint line, and a palpable fluid wave and effusion. ROM normal in flexion and extension and lower leg rotation. Ligaments with solid consistent endpoints including ACL, PCL, LCL, MCL. Negative Mcmurray's and provocative meniscal tests. Non painful patellar compression. Patellar and quadriceps tendons unremarkable. Hamstring and quadriceps strength is normal.  Procedure: Real-time Ultrasound Guided aspiration/injection of right knee Device: GE Logiq E  Verbal informed consent obtained.    Time-out conducted.  Noted no overlying erythema, induration, or other signs of local infection.  Skin prepped in a sterile fashion.  Local anesthesia: Topical Ethyl chloride.  With sterile technique and under real time ultrasound guidance:  With an 18-gauge needle aspirated 20 mL of straw-colored fluid from the right knee, syringe switched and 1 mL kenalog 40, 2 mL lidocaine, 2 mL Marcaine injected easily. Completed without difficulty  Pain immediately resolved suggesting accurate placement of the medication.  Advised to call if fevers/chills, erythema, induration, drainage, or persistent bleeding.  Images permanently stored and available for review in the ultrasound unit.  Impression: Technically successful ultrasound guided injection.  Procedure: Real-time Ultrasound Guided aspiration/injection of left knee Device: GE Logiq E  Verbal informed consent obtained.  Time-out conducted.  Noted no overlying erythema, induration, or other signs of local infection.  Skin prepped in a sterile fashion.  Local anesthesia: Topical Ethyl chloride.  With sterile technique and under real time ultrasound guidance:  With an 18-gauge needle aspirated 30 mL of straw-colored fluid from the right knee, syringe switched and 1 mL kenalog 40, 2 mL lidocaine, 2 mL Marcaine injected easily. Completed without difficulty  Pain immediately resolved suggesting accurate placement of the medication.  Advised to call if fevers/chills, erythema, induration, drainage, or persistent bleeding.  Images permanently stored and available for review in the ultrasound unit.  Impression: Technically successful ultrasound guided injection.  Impression and Recommendations:    Primary osteoarthritis of both knees Bilateral x-rays, bilateral aspiration and injection, return in one month.

## 2016-05-08 NOTE — Assessment & Plan Note (Signed)
Bilateral x-rays, bilateral aspiration and injection, return in one month.

## 2016-05-09 ENCOUNTER — Encounter: Payer: Self-pay | Admitting: Sports Medicine

## 2016-06-03 ENCOUNTER — Ambulatory Visit (INDEPENDENT_AMBULATORY_CARE_PROVIDER_SITE_OTHER): Payer: BLUE CROSS/BLUE SHIELD | Admitting: Family Medicine

## 2016-06-03 ENCOUNTER — Ambulatory Visit (INDEPENDENT_AMBULATORY_CARE_PROVIDER_SITE_OTHER): Payer: BLUE CROSS/BLUE SHIELD

## 2016-06-03 ENCOUNTER — Encounter: Payer: Self-pay | Admitting: Family Medicine

## 2016-06-03 DIAGNOSIS — M898X1 Other specified disorders of bone, shoulder: Secondary | ICD-10-CM

## 2016-06-03 DIAGNOSIS — R0781 Pleurodynia: Secondary | ICD-10-CM | POA: Diagnosis not present

## 2016-06-03 NOTE — Progress Notes (Signed)
   Subjective:    I'm seeing this patient as a consultation for:  Iran Planas, PA-C  CC: Right thoracic back pain.  HPI: Seletha has a 4 week history of right sided thoracic back pain.  She denies any history of injury. Pain is worse with right arm motion and with deep inspiration and cough and sneeze. She denies any fever, chills, NVD. She is well otherwise. She has tried some left over NSAIDs and muscle relaxers which have note worked well.   Past medical history, Surgical history, Family history not pertinant except as noted below, Social history, Allergies, and medications have been entered into the medical record, reviewed, and no changes needed.   Review of Systems: No headache, visual changes, nausea, vomiting, diarrhea, constipation, dizziness, abdominal pain, skin rash, fevers, chills, night sweats, weight loss, swollen lymph nodes, body aches, joint swelling, muscle aches, chest pain, shortness of breath, mood changes, visual or auditory hallucinations.   Objective:    Vitals:   06/03/16 1431  BP: 134/84  Pulse: 83   General: Well Developed, well nourished, and in no acute distress.  Neuro/Psych: Alert and oriented x3, extra-ocular muscles intact, able to move all 4 extremities, sensation grossly intact. Skin: Warm and dry, no rashes noted.  Respiratory: Not using accessory muscles, speaking in full sentences, trachea midline.  Cardiovascular: Pulses palpable, no extremity edema. Abdomen: Does not appear distended. MSK:   Spine: Normal Cspine motion.  Non-tender along spinal midline. TTP right thoracic paraspinals.  TTP right inferior scapula margin.  Shoulder: Normal shoulder motion with negative impingement testing. Normal UE strength BL.     No results found for this or any previous visit (from the past 24 hour(s)). No results found.  Impression and Recommendations:    Assessment and Plan: 55 y.o. female with pain right thoracic spine and right inferior  scapula.. Likely strain of the scapula stabilizers.  CXR and right rib xray pending.  Treat with PT, Heat, TENSs and OTC NSAIDs. Recheck in 2-4 weeks or sooner if needed.    Discussed warning signs or symptoms. Please see discharge instructions. Patient expresses understanding.

## 2016-06-03 NOTE — Patient Instructions (Signed)
Thank you for coming in today. Attend PT.  Use a heating pad.  Call or go to the emergency room if you get worse, have trouble breathing, have chest pains, or palpitations.  Come back or go to the emergency room if you notice new weakness new numbness problems walking or bowel or bladder problems.  TENS UNIT: This is helpful for muscle pain and spasm.   Search and Purchase a TENS 7000 2nd edition at www.tenspros.com. It should be less than $30.     TENS unit instructions: Do not shower or bathe with the unit on Turn the unit off before removing electrodes or batteries If the electrodes lose stickiness add a drop of water to the electrodes after they are disconnected from the unit and place on plastic sheet. If you continued to have difficulty, call the TENS unit company to purchase more electrodes. Do not apply lotion on the skin area prior to use. Make sure the skin is clean and dry as this will help prolong the life of the electrodes. After use, always check skin for unusual red areas, rash or other skin difficulties. If there are any skin problems, does not apply electrodes to the same area. Never remove the electrodes from the unit by pulling the wires. Do not use the TENS unit or electrodes other than as directed. Do not change electrode placement without consultating your therapist or physician. Keep 2 fingers with between each electrode. Wear time ratio is 2:1, on to off times.    For example on for 30 minutes off for 15 minutes and then on for 30 minutes off for 15 minutes

## 2016-06-12 ENCOUNTER — Ambulatory Visit (INDEPENDENT_AMBULATORY_CARE_PROVIDER_SITE_OTHER): Payer: BLUE CROSS/BLUE SHIELD | Admitting: Physical Therapy

## 2016-06-12 DIAGNOSIS — R29898 Other symptoms and signs involving the musculoskeletal system: Secondary | ICD-10-CM

## 2016-06-12 DIAGNOSIS — M549 Dorsalgia, unspecified: Secondary | ICD-10-CM | POA: Diagnosis not present

## 2016-06-12 DIAGNOSIS — M6281 Muscle weakness (generalized): Secondary | ICD-10-CM | POA: Diagnosis not present

## 2016-06-12 NOTE — Therapy (Addendum)
[facility] [place], [place], [address] Phone: [phone]   Fax:  [phone]  Physical Therapy Evaluation  Patient Details  Name: Tracy Landry MRN: [id number] Date of Birth: 04-06-1961 Referring Provider: Rebekah Chesterfield Corey,MD  Encounter Date: 06/12/2016      PT End of Session - 06/12/16 1622    Visit Number 1   Number of Visits 6   Date for PT Re-Evaluation 07/24/16   PT Start Time 1512   PT Stop Time 1609   PT Time Calculation (min) 57 min   Activity Tolerance Patient tolerated treatment well   Behavior During Therapy [facility] for tasks assessed/performed      Past Medical History:  Diagnosis Date  . Asthma   . Back pain   . Depression   . GERD (gastroesophageal reflux disease)   . Hypertension   . 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  . Wears glasses     Past Surgical History:  Procedure Laterality Date  . ABDOMINAL HYSTERECTOMY  3/09  . ABLATION OF DYSRHYTHMIC FOCUS  10/07/2012   SVT  . APPENDECTOMY    . FOOT SURGERY  2000   rt foot fx  . HARDWARE REMOVAL  2003   rt arm  . MASS EXCISION Left 07/07/2013   Procedure: EXCISION MASS LEFT RING FINGER;  Surgeon: Wynonia Sours, MD;  Location: [place];  Service: Orthopedics;  Laterality: Left;  . REPAIR EXTENSOR TENDON Left 07/07/2013   Procedure: REPAIR EXTENSOR TENDON LEFT WITH DEBRIDMENT PROXIMAL INTERPHALANGEAL JOINT;  Surgeon: Wynonia Sours, MD;  Location: [place];  Service: Orthopedics;  Laterality: Left;  . right arm reconstruction  1/01  . SUPRAVENTRICULAR TACHYCARDIA ABLATION N/A 10/07/2012   Procedure: SUPRAVENTRICULAR TACHYCARDIA ABLATION;  Surgeon: Evans Lance, MD;  Location: [facility] CATH LAB;  Service: Cardiovascular;  Laterality: N/A;    There were no vitals filed for this visit.       Subjective Assessment - 06/12/16 1515    Subjective While on vacation driving  motor home (around May 14, 2016) stared having increased right side pain. Pt denies any injury or accident to have caused the pain. Seems like the pain in about the same. Works full time Runner, broadcasting/film/video work.     How long can you sit comfortably? unlimited   How long can you stand comfortably? unlimited   How long can you walk comfortably? unlimited   Patient Stated Goals Get rid of the pain.    Currently in Pain? Yes   Pain Score 5    Pain Location Back  thoracic   Pain Orientation Right;Mid   Pain Descriptors / Indicators Sharp   Pain Type Acute pain   Pain Onset 1 to 4 weeks ago   Pain Frequency Constant   Aggravating Factors  worse in the morning or during activity, pull starting an engine. Getting out of bed.    Pain Relieving Factors nothing   Effect of Pain on Daily Activities limits tolerance to doing job            [facility] PT Assessment - 06/12/16 0001      Assessment   Medical Diagnosis Periscapular pain   Referring Provider Rebekah Chesterfield Corey,MD   Onset Date/Surgical Date 05/14/16   Hand Dominance Right   Next MD Visit none scheduled   Prior Therapy not for this      Observation/Other Assessments   Focus on  Therapeutic Outcomes (FOTO)  55% limited     Sensation   Light Touch Appears Intact     Posture/Postural Control   Posture Comments forward head/rounded shoulders     ROM / Strength   AROM / PROM / Strength AROM;Strength     AROM   AROM Assessment Site Shoulder   Right/Left Shoulder Right   Right Shoulder Flexion --  WFL   Right Shoulder Internal Rotation --  WFL   Right Shoulder External Rotation --  [facility]     Strength   Strength Assessment Site Shoulder   Right/Left Shoulder Right   Right Shoulder Flexion 4+/5   Right Shoulder Extension 4/5   Right Shoulder Internal Rotation 4+/5   Right Shoulder External Rotation 4+/5     Palpation   Palpation comment Tender at Rt paraspinal musculature T6 and inferior.                     [facility] Adult PT Treatment/Exercise - 06/12/16 0001      Exercises   Other Exercises  seated trunk flexion stretch with progression to Lt sidebending.  Seated or with table support. See HEP for details.     Modalities   Modalities Doctor, general practice Location thoracic region   Electrical Stimulation Action IFC   Electrical Stimulation Parameters to pt comfort   Electrical Stimulation Goals Pain     Manual Therapy   Manual Therapy Soft tissue mobilization   Manual therapy comments TPR right thoracic paraspinals                PT Education - 06/12/16 1622    Education provided Yes   Education Details HEP   Person(s) Educated Patient   Methods Explanation;Demonstration;Tactile cues;Verbal cues;Handout   Comprehension Verbalized understanding;Returned demonstration             PT Long Term Goals - 06/12/16 1631      PT LONG TERM GOAL #1   Title Pt to be independent with HEP for strengthening/stretching. (07/24/16)   Time 6   Period Weeks   Status New     PT LONG TERM GOAL #2   Title Pt to report =/> 50% improvement in her pain level. (07/24/16)   Time 6   Period Weeks   Status New     PT LONG TERM GOAL #3   Title Pt to be able to perform supine to sitting transfer without pain. (07/24/16)   Time 6   Period Weeks   Status New     PT LONG TERM GOAL #4   Title FOTO score to be =/< 35% limitation.  (07/24/16)   Time 6   Period Weeks   Status New               Plan - 06/12/16 1624    Clinical Impression Statement Pt is a 55 y.o. female presenting with an insidious onset of Rt sided mid thoracic back pain. At this time the patient's primary complaint is along the thoracic paraspinal musculature with some complaints in the interscapular region as well. Functionally the pt reports difficulty with work related tasks as well as getting in/out of bed. The pt is appropriate for continued PT sessions to  work on addressing these area for improved functional mobility and decreased pain.    Rehab Potential Good   PT Frequency 1x / week   PT Duration 6 weeks   PT Treatment/Interventions ADLs/Self  Care Home Management;Cryotherapy;Electrical Stimulation;Moist Heat;Ultrasound;Patient/family education;Therapeutic exercise;Therapeutic activities;Manual techniques;Taping;Dry needling   PT Next Visit Plan Check success of E-stim and HEP. Progress HEP with thoracic stretches and start strengthening for interscapular/thoracic regions.    Consulted and Agree with Plan of Care Patient      Patient will benefit from skilled therapeutic intervention in order to improve the following deficits and impairments:  Decreased activity tolerance, Decreased range of motion, Decreased strength, Postural dysfunction, Impaired flexibility, Pain, Increased muscle spasms  Visit Diagnosis: Acute mid back pain - Plan: PT plan of care cert/re-cert  Muscle weakness (generalized) - Plan: PT plan of care cert/re-cert  Other symptoms and signs involving the musculoskeletal system - Plan: PT plan of care cert/re-cert     Problem List Patient Active Problem List   Diagnosis Date Noted  . Periscapular pain 06/03/2016  . Primary osteoarthritis of both knees 05/08/2016  . Low back pain 02/22/2016  . Puncture wound 09/13/2015  . Anxiety state 03/28/2015  . Left tibialis posterior tendonitis 08/08/2014  . Plantar fasciitis, left 06/09/2014  . Peroneal tendinitis of left lower leg 04/01/2014  . Right shoulder pain 02/03/2014  . Thrush of mouth and esophagus ([place]) 03/26/2013  . Essential hypertension 07/15/2012  . SVT (supraventricular tachycardia) ([place]) 04/15/2012  . Essential hypertension, malignant 04/15/2012  . Fatigue 04/15/2012  . COUGH 05/15/2010  . CHEST PAIN, RIGHT 05/15/2010  . PALPITATIONS 08/03/2007  . VIRAL URI 07/14/2007  . ANKYLOSING SPONDYLITIS 01/06/2007  . HYPERLIPIDEMIA 08/22/2006  . ASTHMA,  PERSISTENT, MILD 08/12/2006  . OBESITY NOS 06/01/2006  . RHINITIS, ALLERGIC 05/13/2006  . GASTROESOPHAGEAL REFLUX, NO ESOPHAGITIS 05/13/2006  . OSTEOPENIA 05/13/2006    Linard Millers, PT, CSCS 06/12/2016, 4:41 PM  [facility] [place], [place], [address] Phone: [phone]   Fax:  [phone]  Name: KRUTI HORACEK MRN: [id number] Date of Birth: Dec 31, 1960  PHYSICAL THERAPY DISCHARGE SUMMARY  Visits from Start of Care: eval only  Current functional level related to goals / functional outcomes: Unchanged    Remaining deficits: Unchanged    Education / Equipment: Initial HEP  Plan: Patient agrees to discharge.  Patient goals were not met. Patient is being discharged due to not returning since the last visit.  ?????    Celyn P. Helene Kelp PT, MPH 07/31/16 9:12 AM

## 2016-06-15 ENCOUNTER — Encounter: Payer: Self-pay | Admitting: Physician Assistant

## 2016-06-20 ENCOUNTER — Encounter: Payer: BLUE CROSS/BLUE SHIELD | Admitting: Physical Therapy

## 2016-07-09 ENCOUNTER — Encounter: Payer: Self-pay | Admitting: Nurse Practitioner

## 2016-07-09 ENCOUNTER — Ambulatory Visit (INDEPENDENT_AMBULATORY_CARE_PROVIDER_SITE_OTHER): Payer: BLUE CROSS/BLUE SHIELD | Admitting: Nurse Practitioner

## 2016-07-09 VITALS — BP 114/66 | HR 80 | Ht 65.0 in | Wt 182.0 lb

## 2016-07-09 DIAGNOSIS — Z86010 Personal history of colonic polyps: Secondary | ICD-10-CM | POA: Diagnosis not present

## 2016-07-09 DIAGNOSIS — Z01419 Encounter for gynecological examination (general) (routine) without abnormal findings: Secondary | ICD-10-CM | POA: Diagnosis not present

## 2016-07-09 DIAGNOSIS — Z0000 Encounter for general adult medical examination without abnormal findings: Secondary | ICD-10-CM

## 2016-07-09 DIAGNOSIS — F4321 Adjustment disorder with depressed mood: Secondary | ICD-10-CM

## 2016-07-09 MED ORDER — VENLAFAXINE HCL ER 37.5 MG PO CP24: ORAL_CAPSULE | ORAL | 4 refills | Status: DC

## 2016-07-09 NOTE — Patient Instructions (Signed)

## 2016-07-09 NOTE — Progress Notes (Signed)
Patient ID: Tracy Landry, female   DOB: [birth year], 55 y.o.   MRN: [id number]  55 y.o. [id number] Married  Caucasian Fe here for annual exam.  No new diagnosis this year.  May become a kidney donor in the future.  Not a match for her wife.  No other problems.  She is very concerned over Tracy Landry who is on the transplant list for end of January - February 2018.  Patient's last menstrual period was 10/28/2006.          Sexually active: Yes.    The current method of family planning is status post hysterectomy.  Female partner. Exercising: Yes.    walking Smoker:  no  Health Maintenance: Pap:  08/07/2006-Negative (hysterectomy) MMG: 07/28/15, Bi-Rads 1: Negative Colonoscopy:  05/08/2012 F/U in 10 years  BMD: 10/11/15, T Score: -0.1 Spine / -1.0 Left Femur Neck TDaP:  10/20/2013 Shingles: 12/30/12 Prevnar 13: 11/15/13. Pneumovax 23 is pending Hep C and HIV: 07/04/15 Labs: PCP   reports that she has never smoked. She has never used smokeless tobacco. She reports that she drinks alcohol. She reports that she does not use drugs.  Past Medical History:  Diagnosis Date  . Asthma   . Back pain   . Depression   . GERD (gastroesophageal reflux disease)   . Hypertension   . 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  . Wears glasses     Past Surgical History:  Procedure Laterality Date  . ABDOMINAL HYSTERECTOMY  3/09  . ABLATION OF DYSRHYTHMIC FOCUS  10/07/2012   SVT  . APPENDECTOMY    . FOOT SURGERY  2000   rt foot fx  . HARDWARE REMOVAL  2003   rt arm  . MASS EXCISION Left 07/07/2013   Procedure: EXCISION MASS LEFT RING FINGER;  Surgeon: Wynonia Sours, MD;  Location: [place];  Service: Orthopedics;  Laterality: Left;  . REPAIR EXTENSOR TENDON Left 07/07/2013   Procedure: REPAIR EXTENSOR TENDON LEFT WITH DEBRIDMENT PROXIMAL INTERPHALANGEAL JOINT;  Surgeon: Wynonia Sours, MD;  Location: [place];  Service: Orthopedics;  Laterality:  Left;  . right arm reconstruction  1/01  . SUPRAVENTRICULAR TACHYCARDIA ABLATION N/A 10/07/2012   Procedure: SUPRAVENTRICULAR TACHYCARDIA ABLATION;  Surgeon: Evans Lance, MD;  Location: [facility] CATH LAB;  Service: Cardiovascular;  Laterality: N/A;    Current Outpatient Prescriptions  Medication Sig Dispense Refill  . albuterol (PROVENTIL HFA;VENTOLIN HFA) 108 (90 BASE) MCG/ACT inhaler Inhale 2 puffs into the lungs every 6 (six) hours as needed. For wheezing 1 Inhaler 1  . cyclobenzaprine (FLEXERIL) 10 MG tablet Take 1 tablet (10 mg total) by mouth at bedtime. 30 tablet 11  . DEXILANT 60 MG capsule Take 1 capsule (60 mg total) by mouth daily. 30 capsule 11  . montelukast (SINGULAIR) 10 MG tablet TAKE ONE TABLET BY MOUTH ONCE DAILY WITH BREAKFAST 30 tablet 11  . Multiple Vitamins-Minerals (MULTIVITAMIN PO) Take by mouth daily.    Marland Kitchen venlafaxine XR (EFFEXOR-XR) 37.5 MG 24 hr capsule TAKE 1 CAPSULES BY MOUTH ONCE DAILY 90 capsule 4   No current facility-administered medications for this visit.     Family History  Problem Relation Age of Onset  . Alcohol abuse Mother   . Diabetes Mother   . Hyperlipidemia Mother   . Hypertension Mother   . Heart disease Mother     Rheumatic disease  . Alzheimer's disease Father   . Diabetes Sister   .  Hyperlipidemia Sister   . Hypertension Sister   . Heart attack Maternal Uncle   . Lung cancer Maternal Grandmother     lung  . Heart attack Maternal Grandfather     ROS:  Pertinent items are noted in HPI.  Otherwise, a comprehensive ROS was negative.  Exam:   LMP 10/28/2006    Ht Readings from Last 3 Encounters:  10/04/15 5\' 5"  (1.651 m)  10/02/15 5\' 5"  (1.651 m)  07/04/15 5\' 5"  (1.651 m)    General appearance: alert, cooperative and appears stated age Head: Normocephalic, without obvious abnormality, atraumatic Neck: no adenopathy, supple, symmetrical, trachea midline and thyroid normal to inspection and palpation Lungs: clear to auscultation  bilaterally Breasts: normal appearance, no masses or tenderness Heart: regular rate and rhythm Abdomen: soft, non-tender; no masses,  no organomegaly Extremities: extremities normal, atraumatic, no cyanosis or edema Skin: Skin color, texture, turgor normal. No rashes or lesions Lymph nodes: Cervical, supraclavicular, and axillary nodes normal. No abnormal inguinal nodes palpated Neurologic: Grossly normal   Pelvic: External genitalia:  no lesions              Urethra:  normal appearing urethra with no masses, tenderness or lesions              Bartholin's and Skene's: normal                 Vagina: normal appearing vagina with normal color and discharge, no lesions              Cervix: absent              Pap taken: No. Bimanual Exam:  Uterus:  uterus absent              Adnexa: no mass, fullness, tenderness               Rectovaginal: Confirms               Anus:  normal sphincter tone, no lesions  Chaperone present: yes  A:  Well Woman with normal exam    S/P TAH & RSO for endometrial polyp, right OV cyst 10/2006, not on HRT Osteopenia - borderline H/O colonic polyps - normal in 2013 History of SVT with cardiac ablation 10/07/12 Situational depression   P:   Reviewed health and wellness pertinent to exam  Pap smear not done  Mammogram is due late December and will get apt.  Refill on Effexor 37.5 mg daily for a year  Counseled on breast self exam, mammography screening, adequate intake of calcium and vitamin D, diet and exercise, Kegel's exercises return annually or prn  An After Visit Summary was printed and given to the patient.

## 2016-07-09 NOTE — Progress Notes (Signed)
Encounter reviewed by Dr. Brook Amundson C. Silva.  

## 2016-07-25 ENCOUNTER — Ambulatory Visit (INDEPENDENT_AMBULATORY_CARE_PROVIDER_SITE_OTHER): Payer: BLUE CROSS/BLUE SHIELD | Admitting: Sports Medicine

## 2016-07-25 ENCOUNTER — Encounter: Payer: Self-pay | Admitting: Sports Medicine

## 2016-07-25 DIAGNOSIS — M170 Bilateral primary osteoarthritis of knee: Secondary | ICD-10-CM

## 2016-07-25 NOTE — Progress Notes (Signed)
  Subjective:    CC: bilateral knee pain  HPI: This is a pleasant 82 -year-oldfemale, she's had Visco supplementation about 2 years ago, and we did a bilateral knee aspiration and injection about to have months ago.  She has a recurrence of pain, at the joint lines, with swelling, moderate, persistent without radiation and desires interventional treatment today. No mechanical symptoms, no trauma.  Past medical history:  Negative.  See flowsheet/record as well for more information.  Surgical history: Negative.  See flowsheet/record as well for more information.  Family history: Negative.  See flowsheet/record as well for more information.  Social history: Negative.  See flowsheet/record as well for more information.  Allergies, and medications have been entered into the medical record, reviewed, and no changes needed.   Review of Systems: No fevers, chills, night sweats, weight loss, chest pain, or shortness of breath.   Objective:    General: Well Developed, well nourished, and in no acute distress.  Neuro: Alert and oriented x3, extra-ocular muscles intact, sensation grossly intact.  HEENT: Normocephalic, atraumatic, pupils equal round reactive to light, neck supple, no masses, no lymphadenopathy, thyroid nonpalpable.  Skin: Warm and dry, no rashes. Cardiac: Regular rate and rhythm, no murmurs rubs or gallops, no lower extremity edema.  Respiratory: Clear to auscultation bilaterally. Not using accessory muscles, speaking in full sentences. Bilateral knees: Visible swelling with a palpable fluid wave and effusion, tenderness at the medial joint lines bilaterally ROM normal in flexion and extension and lower leg rotation. Ligaments with solid consistent endpoints including ACL, PCL, LCL, MCL. Negative Mcmurray's and provocative meniscal tests. Non painful patellar compression. Patellar and quadriceps tendons unremarkable. Hamstring and quadriceps strength is normal.  Procedure:  Real-time Ultrasound Guided aspiration/injection of left knee Device: GE Logiq E  Verbal informed consent obtained.  Time-out conducted.  Noted no overlying erythema, induration, or other signs of local infection.  Skin prepped in a sterile fashion.  Local anesthesia: Topical Ethyl chloride.  With sterile technique and under real time ultrasound guidance:  Aspirated 15 mL straw-colored fluid, syringe switched and 1/2 mL kenalog 40, 2 mL lidocaine, 2 mL Marcaine injected easily. Completed without difficulty  Pain immediately resolved suggesting accurate placement of the medication.  Advised to call if fevers/chills, erythema, induration, drainage, or persistent bleeding.  Images permanently stored and available for review in the ultrasound unit.  Impression: Technically successful ultrasound guided injection.  Procedure: Real-time Ultrasound Guided aspiration/injection of right knee Device: GE Logiq E  Verbal informed consent obtained.  Time-out conducted.  Noted no overlying erythema, induration, or other signs of local infection.  Skin prepped in a sterile fashion.  Local anesthesia: Topical Ethyl chloride.  With sterile technique and under real time ultrasound guidance:  Aspirated 15 mL straw-colored fluid, syringe switched and 1/2 mL kenalog 40, 2 mL lidocaine, 2 mL Marcaine injected easily. Completed without difficulty  Pain immediately resolved suggesting accurate placement of the medication.  Advised to call if fevers/chills, erythema, induration, drainage, or persistent bleeding.  Images permanently stored and available for review in the ultrasound unit.  Impression: Technically successful ultrasound guided injection.  Impression and Recommendations:    Primary osteoarthritis of both knees Aspiration and half dose injection, we will get her set up for viscous supplementation.

## 2016-07-25 NOTE — Assessment & Plan Note (Signed)
Aspiration and half dose injection, we will get her set up for viscous supplementation.

## 2016-07-30 ENCOUNTER — Telehealth: Payer: Self-pay | Admitting: Sports Medicine

## 2016-07-30 NOTE — Telephone Encounter (Signed)
-----   Message from Silverio Decamp, MD sent at 07/25/2016  5:10 PM EST ----- Visco approval please [place], bilateral. ___________________________________________ Gwen Her. Dianah Field, M.D., ABFM., CAQSM. Primary Care and [place] Instructor of [facility] of Medicine

## 2016-07-30 NOTE — Telephone Encounter (Signed)
Will submit Jan 2018

## 2016-08-09 ENCOUNTER — Other Ambulatory Visit: Payer: Self-pay | Admitting: Nurse Practitioner

## 2016-08-09 MED ORDER — VENLAFAXINE HCL ER 37.5 MG PO CP24: ORAL_CAPSULE | ORAL | 4 refills | Status: DC

## 2016-08-09 NOTE — Progress Notes (Unsigned)
Pt is feeling quite stressed and overwhelmed with [name]'s upcoming surgery for kidney transplant.  She is now up to 2 tabs on some days of Effexor.

## 2016-08-13 ENCOUNTER — Other Ambulatory Visit: Payer: Self-pay | Admitting: Obstetrics & Gynecology

## 2016-08-13 DIAGNOSIS — Z1231 Encounter for screening mammogram for malignant neoplasm of breast: Secondary | ICD-10-CM

## 2016-08-13 NOTE — Telephone Encounter (Signed)
Submitted for approval on Orthovisc. Awaiting confirmation.  

## 2016-08-20 MED ORDER — SODIUM HYALURONATE (VISCOSUP) 20 MG/2ML IX SOSY: 1.0000 | PREFILLED_SYRINGE | INTRA_ARTICULAR | 0 refills | Status: DC

## 2016-08-20 NOTE — Telephone Encounter (Signed)
Done

## 2016-08-20 NOTE — Addendum Note (Signed)
Addended by: Silverio Decamp on: 08/20/2016 12:42 PM   Modules accepted: Orders

## 2016-08-20 NOTE — Telephone Encounter (Signed)
Received the following information from [employer] benefits investigation:   Patient has PPO plan with an effective date of 08/05/2016. Orthovisc is not the preferred drug through [facility] and will not be covered until patient has tried and failed with the preferred drug. pre-cert would be required after trying and failing with preferred, pre-cert phone # is [phone]. [id number] is covered at 100% & [id number] is covered at 100% of the contracted rate when performed in an office setting. Deductible does not apply. A copay of $40.00 applies whether or not a specialist office is billed. if out of pocket is met, copay will no longer apply. REF: [id number]  Praxair, spoke with Cyndia Bent. Was advised that Euflexxa is the preferred drug. Will route to Provider for order. [facility].   Left VM and notified Pt of current order status.

## 2016-08-21 ENCOUNTER — Ambulatory Visit: Payer: BLUE CROSS/BLUE SHIELD

## 2016-08-26 ENCOUNTER — Ambulatory Visit (INDEPENDENT_AMBULATORY_CARE_PROVIDER_SITE_OTHER): Payer: BLUE CROSS/BLUE SHIELD | Admitting: Physician Assistant

## 2016-08-26 VITALS — BP 126/77 | HR 100 | Temp 98.5°F | Wt 189.0 lb

## 2016-08-26 DIAGNOSIS — Z23 Encounter for immunization: Secondary | ICD-10-CM

## 2016-08-26 NOTE — Progress Notes (Signed)
   Subjective:    Patient ID: Tracy Landry, female    DOB: Jul 09, 1961, 56 y.o.   MRN: [id number]  Tracy Landry is here for a pneumococcal 23 vaccine. Denies problems with vaccines.   Patient is due for a yearly mammogram.       Review of Systems  Constitutional: Negative.   HENT: Negative.   Respiratory: Negative.   Cardiovascular: Negative.   Gastrointestinal: Negative.   Genitourinary: Negative.        Objective:   Physical Exam        Assessment & Plan:  Need for pneumonia vaccine - Patient tolerated injection well without complications.   Screening for breast cancer - Mammogram ordered. Patient is scheduled for next week.

## 2016-08-28 ENCOUNTER — Ambulatory Visit (INDEPENDENT_AMBULATORY_CARE_PROVIDER_SITE_OTHER): Payer: BLUE CROSS/BLUE SHIELD

## 2016-08-28 DIAGNOSIS — Z1231 Encounter for screening mammogram for malignant neoplasm of breast: Secondary | ICD-10-CM | POA: Diagnosis not present

## 2016-09-26 ENCOUNTER — Encounter: Payer: Self-pay | Admitting: Sports Medicine

## 2016-09-26 ENCOUNTER — Ambulatory Visit (INDEPENDENT_AMBULATORY_CARE_PROVIDER_SITE_OTHER): Payer: BLUE CROSS/BLUE SHIELD | Admitting: Sports Medicine

## 2016-09-26 ENCOUNTER — Ambulatory Visit (INDEPENDENT_AMBULATORY_CARE_PROVIDER_SITE_OTHER): Payer: BLUE CROSS/BLUE SHIELD

## 2016-09-26 DIAGNOSIS — M19031 Primary osteoarthritis, right wrist: Secondary | ICD-10-CM | POA: Diagnosis not present

## 2016-09-26 DIAGNOSIS — M25531 Pain in right wrist: Secondary | ICD-10-CM | POA: Insufficient documentation

## 2016-09-26 NOTE — Progress Notes (Signed)
I have seen and examined the below patient, I agree with the med student's findings, assessment, and plan. ___________________________________________ Tracy Landry, M.D., ABFM., CAQSM. Primary Care and Sports Medicine [facility]  Adjunct Instructor of Family Medicine  [employer] 

## 2016-09-26 NOTE — Progress Notes (Signed)
   Subjective:    I'm seeing this patient as a consultation for:  Tracy Planas, PA-C  CC: pain in wrist  HPI:  This is a 56 year old female who presents for pain in her right wrist which began yesterday abruptly without preceding trauma or action. She states she was unable to move her wrist yesterday so she splinted it which provided minimal relief. Tylenol also provided minimal relief.  Approximately 20 years ago she had a surgery on this wrist after a radial head fracture. A screw was inserted into her radius and has always been visible and palpable.    Past medical history:  Negative.  See flowsheet/record as well for more information.  Surgical history: Negative.  See flowsheet/record as well for more information.  Family history: Negative.  See flowsheet/record as well for more information.  Social history: Negative.  See flowsheet/record as well for more information.  Allergies, and medications have been entered into the medical record, reviewed, and no changes needed.   Review of Systems: No headache, visual changes, nausea, vomiting, diarrhea, constipation, dizziness, abdominal pain, skin rash, fevers, chills, night sweats, weight loss, swollen lymph nodes, body aches, joint swelling, muscle aches, chest pain, shortness of breath, mood changes, visual or auditory hallucinations.   Objective:   General: Well Developed, well nourished, and in no acute distress.  Neuro/Psych: Alert and oriented x3, extra-ocular muscles intact, able to move all 4 extremities, sensation grossly intact. Skin: Warm and dry, no rashes noted.  Respiratory: Not using accessory muscles, speaking in full sentences, trachea midline.  Cardiovascular: Pulses palpable, no extremity edema. Abdomen: Does not appear distended.  Musculoskeletal: Significant tenderness along the tendon of the right extensor carpi ulnaris. Somewhat limited flexion and extension however strength remains intact. Small swelling noted along  right ulna however it is without erythema or warmth. Cap refill 2 sec, distal sensation in tact.   Procedure: Real-time Ultrasound Guided Injection of right extensor carpi ulnaris tendon sheath Device: GE Logiq E  Verbal informed consent obtained.  Time-out conducted.  Noted no overlying erythema, induration, or other signs of local infection.  Skin prepped in a sterile fashion.  Local anesthesia: Topical Ethyl chloride.  With sterile technique and under real time ultrasound guidance:  1 mL Kenalog 40, 1 mL lidocaine injected easily Completed without difficulty  Pain immediately resolved suggesting accurate placement of the medication.  Advised to call if fevers/chills, erythema, induration, drainage, or persistent bleeding.  Images permanently stored and available for review in the ultrasound unit.  Impression: Technically successful ultrasound guided injection.  Impression and Recommendations:   This case required medical decision making of moderate complexity.  Preslee was seen today for wrist pain.  Diagnoses and all orders for this visit:  Right wrist pain -     DG Wrist Complete Right; Future  Steroid injection given in extensor capi ulnaris to help alleviate probable tendonitis. Patient encouraged to use brace as needed and continue taking Tylenol for pain. Will contact with x-ray results.

## 2016-09-26 NOTE — Assessment & Plan Note (Signed)
Question radial shortening osteotomy in the distant past, I had never heard of this procedure. We are going to get x-rays and her symptoms today are consistent with a 6 extensor compartment tendinitis. This was injected under ultrasound guidance, she will return to her wrist brace. X-rays. Return to see me in one month. She should continue her wrist brace for 2 weeks.

## 2016-10-09 DIAGNOSIS — M170 Bilateral primary osteoarthritis of knee: Secondary | ICD-10-CM | POA: Diagnosis not present

## 2016-10-09 NOTE — Telephone Encounter (Signed)
Received call from [facility]. They will ship euflexxa tomorrow. Pt advised.

## 2016-10-14 ENCOUNTER — Ambulatory Visit (INDEPENDENT_AMBULATORY_CARE_PROVIDER_SITE_OTHER): Payer: BLUE CROSS/BLUE SHIELD | Admitting: Sports Medicine

## 2016-10-14 ENCOUNTER — Encounter: Payer: Self-pay | Admitting: Sports Medicine

## 2016-10-14 DIAGNOSIS — M170 Bilateral primary osteoarthritis of knee: Secondary | ICD-10-CM

## 2016-10-14 NOTE — Progress Notes (Signed)
  Procedure: Real-time Ultrasound Guided Injection of left knee Device: GE Logiq E  Verbal informed consent obtained.  Time-out conducted.  Noted no overlying erythema, induration, or other signs of local infection.  Skin prepped in a sterile fashion.  Local anesthesia: Topical Ethyl chloride.  With sterile technique and under real time ultrasound guidance:  Euflexxa injected into the suprapatellar recess with a 22-gauge needle. Completed without difficulty  Pain immediately resolved suggesting accurate placement of the medication.  Advised to call if fevers/chills, erythema, induration, drainage, or persistent bleeding.  Images permanently stored and available for review in the ultrasound unit.  Impression: Technically successful ultrasound guided injection.    Procedure: Real-time Ultrasound Guided Injection of right knee Device: GE Logiq E  Verbal informed consent obtained.  Time-out conducted.  Noted no overlying erythema, induration, or other signs of local infection.  Skin prepped in a sterile fashion.  Local anesthesia: Topical Ethyl chloride.  With sterile technique and under real time ultrasound guidance:  Euflexxa injected into the suprapatellar recess with a 22-gauge needle. Completed without difficulty  Pain immediately resolved suggesting accurate placement of the medication.  Advised to call if fevers/chills, erythema, induration, drainage, or persistent bleeding.  Images permanently stored and available for review in the ultrasound unit.  Impression: Technically successful ultrasound guided injection.

## 2016-10-14 NOTE — Assessment & Plan Note (Signed)
Euflexxa injection into both knees, #1 of 3. Return in one week for Euflexxa injection #2 of 3 into both knees

## 2016-10-21 ENCOUNTER — Encounter: Payer: Self-pay | Admitting: Sports Medicine

## 2016-10-21 ENCOUNTER — Ambulatory Visit (INDEPENDENT_AMBULATORY_CARE_PROVIDER_SITE_OTHER): Payer: BLUE CROSS/BLUE SHIELD | Admitting: Sports Medicine

## 2016-10-21 DIAGNOSIS — M170 Bilateral primary osteoarthritis of knee: Secondary | ICD-10-CM

## 2016-10-21 NOTE — Progress Notes (Signed)
  Procedure: Real-time Ultrasound Guided Injection of left knee Device: GE Logiq E  Verbal informed consent obtained.  Time-out conducted.  Noted no overlying erythema, induration, or other signs of local infection.  Skin prepped in a sterile fashion.  Local anesthesia: Topical Ethyl chloride.  With sterile technique and under real time ultrasound guidance:  Aspirated 24 mL straw-colored fluid, syringe switched and Euflexxa injected into the suprapatellar recess with a 18-gauge needle. Completed without difficulty  Pain immediately resolved suggesting accurate placement of the medication.  Advised to call if fevers/chills, erythema, induration, drainage, or persistent bleeding.  Images permanently stored and available for review in the ultrasound unit.  Impression: Technically successful ultrasound guided injection.    Procedure: Real-time Ultrasound Guided Injection of right knee Device: GE Logiq E  Verbal informed consent obtained.  Time-out conducted.  Noted no overlying erythema, induration, or other signs of local infection.  Skin prepped in a sterile fashion.  Local anesthesia: Topical Ethyl chloride.  With sterile technique and under real time ultrasound guidance:  Aspirated 28 mL straw-colored fluid, syringe switched and Euflexxa injected into the suprapatellar recess with a 18-gauge needle.Completed without difficulty  Pain immediately resolved suggesting accurate placement of the medication.  Advised to call if fevers/chills, erythema, induration, drainage, or persistent bleeding.  Images permanently stored and available for review in the ultrasound unit.  Impression: Technically successful ultrasound guided injection.

## 2016-10-21 NOTE — Assessment & Plan Note (Signed)
Euflexxa injection #2 of 3 into both knees, return in one week for #3 of 3.

## 2016-10-24 ENCOUNTER — Ambulatory Visit: Payer: BLUE CROSS/BLUE SHIELD | Admitting: Sports Medicine

## 2016-10-24 ENCOUNTER — Other Ambulatory Visit: Payer: Self-pay | Admitting: Physician Assistant

## 2016-10-28 ENCOUNTER — Ambulatory Visit: Payer: BLUE CROSS/BLUE SHIELD | Admitting: Sports Medicine

## 2016-10-31 ENCOUNTER — Ambulatory Visit (INDEPENDENT_AMBULATORY_CARE_PROVIDER_SITE_OTHER): Payer: BLUE CROSS/BLUE SHIELD | Admitting: Sports Medicine

## 2016-10-31 DIAGNOSIS — M170 Bilateral primary osteoarthritis of knee: Secondary | ICD-10-CM | POA: Diagnosis not present

## 2016-10-31 NOTE — Progress Notes (Signed)
  Procedure: Real-time Ultrasound Guided Injection of left knee Device: GE Logiq E  Verbal informed consent obtained.  Time-out conducted.  Noted no overlying erythema, induration, or other signs of local infection.  Skin prepped in a sterile fashion.  Local anesthesia: Topical Ethyl chloride.  With sterile technique and under real time ultrasound guidance:  Aspirated 13 mL straw-colored fluid, syringe switched and Euflexxa injected into the suprapatellar recess with a 18-gauge needle. Completed without difficulty  Pain immediately resolved suggesting accurate placement of the medication.  Advised to call if fevers/chills, erythema, induration, drainage, or persistent bleeding.  Images permanently stored and available for review in the ultrasound unit.  Impression: Technically successful ultrasound guided injection.    Procedure: Real-time Ultrasound Guided Injection of right knee Device: GE Logiq E  Verbal informed consent obtained.  Time-out conducted.  Noted no overlying erythema, induration, or other signs of local infection.  Skin prepped in a sterile fashion.  Local anesthesia: Topical Ethyl chloride.  With sterile technique and under real time ultrasound guidance:  Aspirated 12 mL straw-colored fluid, syringe switched and Euflexxa injected into the suprapatellar recess with a 18-gauge needle.Completed without difficulty  Pain immediately resolved suggesting accurate placement of the medication.  Advised to call if fevers/chills, erythema, induration, drainage, or persistent bleeding.  Images permanently stored and available for review in the ultrasound unit.  Impression: Technically successful ultrasound guided injection.

## 2016-10-31 NOTE — Assessment & Plan Note (Signed)
Euflexxa injection #3 of 3. Return as needed.

## 2016-11-04 ENCOUNTER — Other Ambulatory Visit: Payer: Self-pay | Admitting: Physician Assistant

## 2016-11-06 ENCOUNTER — Other Ambulatory Visit: Payer: Self-pay | Admitting: *Deleted

## 2016-11-06 MED ORDER — DICLOFENAC SODIUM 75 MG PO TBEC: 75.0000 mg | DELAYED_RELEASE_TABLET | Freq: Every day | ORAL | 5 refills | Status: DC

## 2016-12-09 ENCOUNTER — Ambulatory Visit (INDEPENDENT_AMBULATORY_CARE_PROVIDER_SITE_OTHER): Payer: BLUE CROSS/BLUE SHIELD | Admitting: Physician Assistant

## 2016-12-09 ENCOUNTER — Encounter: Payer: Self-pay | Admitting: Physician Assistant

## 2016-12-09 VITALS — BP 112/71 | HR 80 | Temp 97.8°F | Ht 65.0 in | Wt 189.0 lb

## 2016-12-09 DIAGNOSIS — R05 Cough: Secondary | ICD-10-CM

## 2016-12-09 DIAGNOSIS — R059 Cough, unspecified: Secondary | ICD-10-CM

## 2016-12-09 DIAGNOSIS — J329 Chronic sinusitis, unspecified: Secondary | ICD-10-CM

## 2016-12-09 MED ORDER — METHYLPREDNISOLONE SODIUM SUCC 125 MG IJ SOLR
125.0000 mg | Freq: Once | INTRAMUSCULAR | Status: AC
Start: 2016-12-09 — End: 2016-12-09
  Administered 2016-12-09: 125 mg via INTRAMUSCULAR

## 2016-12-09 MED ORDER — HYDROCOD POLST-CPM POLST ER 10-8 MG/5ML PO SUER: 5.0000 mL | Freq: Two times a day (BID) | ORAL | 0 refills | Status: DC | PRN

## 2016-12-09 MED ORDER — AZITHROMYCIN 250 MG PO TABS: ORAL_TABLET | ORAL | 0 refills | Status: DC

## 2016-12-09 NOTE — Patient Instructions (Signed)

## 2016-12-09 NOTE — Progress Notes (Signed)
   Subjective:    Patient ID: Tracy Landry, female    DOB: October 12, 1960, 56 y.o.   MRN: [id number]  HPI  Patient is a 56 yo female who presents to the clinic with 4 days of sinus pressure, headache, facial pain, cough, and low grade fever. Her partner is status post kidney transplant and she does not want to be sick. She has a hx of bronchitis that turns into pneumonia. She has tried dayquil, nyquil, sudafed and tylenol around the clock. She continues to get worse.    Review of Systems  All other systems reviewed and are negative.      Objective:   Physical Exam  Constitutional: She is oriented to person, place, and time. She appears well-developed and well-nourished.  HENT:  Head: Normocephalic and atraumatic.  Right Ear: External ear normal.  Left Ear: External ear normal.  Mouth/Throat: No oropharyngeal exudate.  TM's clear.  Oropharynx erythematous with PND no exudate.  Bilateral nasal turbinates red and swollen.  Tenderness to palpation over bilateral maxillary and frontal sinuses.   Eyes: Conjunctivae are normal. Right eye exhibits no discharge. Left eye exhibits no discharge.  Neck: Normal range of motion. Neck supple.  Cardiovascular: Normal rate, regular rhythm and normal heart sounds.   Pulmonary/Chest: Effort normal and breath sounds normal.  Coarse rhonchi.  Lymphadenopathy:    She has no cervical adenopathy.  Neurological: She is alert and oriented to person, place, and time.  Psychiatric: She has a normal mood and affect. Her behavior is normal.          Assessment & Plan:  Marland KitchenMarland KitchenDiagnoses and all orders for this visit:  Sinusitis, unspecified chronicity, unspecified location -     azithromycin (ZITHROMAX) 250 MG tablet; Take 2 tablets now and then one tablet for 4 days. -     methylPREDNISolone sodium succinate (SOLU-MEDROL) 125 mg/2 mL injection 125 mg; Inject 2 mLs (125 mg total) into the muscle once.  Cough -     chlorpheniramine-HYDROcodone (TUSSIONEX)  10-8 MG/5ML SUER; Take 5 mLs by mouth every 12 (twelve) hours as needed for cough (cough, will cause drowsiness.).   Pt works outside I do think there is a allergic component. Solumedrol given today. Started zpak. tussionex given to use at bedtime. [place] controlled substance database reviewed with no concerns. Encouraged flonase daily.

## 2016-12-10 ENCOUNTER — Encounter: Payer: Self-pay | Admitting: Physician Assistant

## 2016-12-11 DIAGNOSIS — M9902 Segmental and somatic dysfunction of thoracic region: Secondary | ICD-10-CM | POA: Diagnosis not present

## 2016-12-11 DIAGNOSIS — M542 Cervicalgia: Secondary | ICD-10-CM | POA: Diagnosis not present

## 2016-12-11 DIAGNOSIS — M546 Pain in thoracic spine: Secondary | ICD-10-CM | POA: Diagnosis not present

## 2016-12-11 DIAGNOSIS — M9901 Segmental and somatic dysfunction of cervical region: Secondary | ICD-10-CM | POA: Diagnosis not present

## 2017-01-23 ENCOUNTER — Ambulatory Visit (INDEPENDENT_AMBULATORY_CARE_PROVIDER_SITE_OTHER): Payer: BLUE CROSS/BLUE SHIELD

## 2017-01-23 ENCOUNTER — Ambulatory Visit (INDEPENDENT_AMBULATORY_CARE_PROVIDER_SITE_OTHER): Payer: BLUE CROSS/BLUE SHIELD | Admitting: Podiatry

## 2017-01-23 ENCOUNTER — Encounter: Payer: Self-pay | Admitting: Podiatry

## 2017-01-23 DIAGNOSIS — D3610 Benign neoplasm of peripheral nerves and autonomic nervous system, unspecified: Secondary | ICD-10-CM | POA: Diagnosis not present

## 2017-01-23 DIAGNOSIS — M722 Plantar fascial fibromatosis: Secondary | ICD-10-CM

## 2017-01-23 NOTE — Progress Notes (Signed)
She presents today with a chief concern of pain to the forefoot left. She states going on now for about 2 months since she twisted her foot. She also has 2 nodules to the medial aspect of the left foot. I when asking about these nodules if she had anything in her palms of her hands and she states that she has Dupuytren's contracture  of her hands. She states that my foot feels like it did before we did surgery on it. She is status post excision of a neuroma third interdigital space left.  Objective: Vital signs are stable she's alert and oriented 3. Pulses are palpable. She has pain on palpation to the third interdigital space of the left foot. Radiating pain is noted. Palpable Mulder's click is not noted.  Assessment: Neuroma/stump neuroma third interdigital space left.  Plan: Injected very painful third interdigital space today with long local anesthetic follow-up with her as needed.

## 2017-02-14 DIAGNOSIS — D235 Other benign neoplasm of skin of trunk: Secondary | ICD-10-CM | POA: Diagnosis not present

## 2017-02-14 DIAGNOSIS — D1801 Hemangioma of skin and subcutaneous tissue: Secondary | ICD-10-CM | POA: Diagnosis not present

## 2017-05-12 ENCOUNTER — Ambulatory Visit (INDEPENDENT_AMBULATORY_CARE_PROVIDER_SITE_OTHER): Payer: BLUE CROSS/BLUE SHIELD | Admitting: Physician Assistant

## 2017-05-12 DIAGNOSIS — Z23 Encounter for immunization: Secondary | ICD-10-CM | POA: Diagnosis not present

## 2017-07-01 ENCOUNTER — Telehealth: Payer: Self-pay | Admitting: *Deleted

## 2017-07-01 NOTE — Telephone Encounter (Signed)
Pre Authorization sent to cover my meds. [id number]

## 2017-07-01 NOTE — Telephone Encounter (Signed)
Called patient. Insurance requesting a PA even though one was done in 2015 and approved through 2039 and patient has the same insurance. Need to know what patient has tried and failed in the past

## 2017-07-03 NOTE — Telephone Encounter (Signed)
dexilant has been approved.patient notified

## 2017-07-07 ENCOUNTER — Telehealth: Payer: Self-pay | Admitting: Physician Assistant

## 2017-07-07 NOTE — Telephone Encounter (Signed)
Spoke with pharmacy.  Dexilant was approved but the cost is $261.  Notified pt & she is just going to do otc nexium until the beginning of the year.

## 2017-07-07 NOTE — Telephone Encounter (Addendum)
Ms. Tracy Landry called. [name] is still not able to get med from [place]  although they were told med has been approved.

## 2017-07-14 ENCOUNTER — Ambulatory Visit: Payer: BLUE CROSS/BLUE SHIELD | Admitting: Obstetrics & Gynecology

## 2017-07-15 ENCOUNTER — Ambulatory Visit: Payer: BLUE CROSS/BLUE SHIELD | Admitting: Nurse Practitioner

## 2017-08-01 ENCOUNTER — Other Ambulatory Visit: Payer: Self-pay | Admitting: Obstetrics & Gynecology

## 2017-08-01 DIAGNOSIS — Z1231 Encounter for screening mammogram for malignant neoplasm of breast: Secondary | ICD-10-CM

## 2017-08-08 ENCOUNTER — Other Ambulatory Visit: Payer: Self-pay | Admitting: Physician Assistant

## 2017-08-25 ENCOUNTER — Telehealth: Payer: Self-pay | Admitting: Physician Assistant

## 2017-08-25 ENCOUNTER — Ambulatory Visit: Payer: BLUE CROSS/BLUE SHIELD | Admitting: Physician Assistant

## 2017-08-25 NOTE — Telephone Encounter (Signed)
Received fax for pa on Dexilant sent through cover my meds and medication is approved from 08/25/2017 - 08/23/2020. - CF

## 2017-09-03 ENCOUNTER — Ambulatory Visit: Payer: Self-pay

## 2017-09-04 ENCOUNTER — Ambulatory Visit: Payer: Self-pay

## 2017-09-04 ENCOUNTER — Ambulatory Visit (INDEPENDENT_AMBULATORY_CARE_PROVIDER_SITE_OTHER): Payer: BLUE CROSS/BLUE SHIELD

## 2017-09-04 DIAGNOSIS — Z1231 Encounter for screening mammogram for malignant neoplasm of breast: Secondary | ICD-10-CM

## 2017-09-08 ENCOUNTER — Ambulatory Visit: Payer: BLUE CROSS/BLUE SHIELD | Admitting: Obstetrics & Gynecology

## 2017-09-08 ENCOUNTER — Encounter: Payer: Self-pay | Admitting: Obstetrics & Gynecology

## 2017-09-08 ENCOUNTER — Other Ambulatory Visit: Payer: Self-pay

## 2017-09-08 VITALS — BP 120/80 | HR 74 | Resp 14 | Ht 64.0 in | Wt 204.0 lb

## 2017-09-08 DIAGNOSIS — Z01419 Encounter for gynecological examination (general) (routine) without abnormal findings: Secondary | ICD-10-CM | POA: Diagnosis not present

## 2017-09-08 NOTE — Progress Notes (Signed)
57 y.o. G0P0000 MarriedCaucasianF here for annual exam.  Doing well.  Partner, Pamala Hurry, had a transplant last year.  Pt has been diagnosed with ankylosing spondylitis.  Did have positive blood work but had not had any other symptoms.  Not completely sure she agrees with diagnosis.  Denies vaginal bleeding or vaginal discharge.  So happy with [name]'s recovery.  Reports they are not going to waste this opportunity and are traveling.    Patient's last menstrual period was 10/28/2006.          Sexually active: Yes.    The current method of family planning is status post hysterectomy.    Exercising: No.  The patient does not participate in regular exercise at present. Smoker:  no  Health Maintenance: Pap:  08/07/2006- negative  History of abnormal Pap:  no MMG:  09/05/17 BIRADS 1 negative  Colonoscopy:  05/08/12 repeat 10 years  BMD:   10/11/15 normal  TDaP:  10/20/13  Pneumonia vaccine(s):  11/15/13, 08/26/16  Shingrix:   12/30/12 Hep C testing: 07/04/15 negative Screening Labs: PCP, Hb today: PCP, Urine today: not collected   reports that  has never smoked. she has never used smokeless tobacco. She reports that she drinks alcohol. She reports that she does not use drugs.  Past Medical History:  Diagnosis Date  . Asthma   . Back pain   . Depression   . GERD (gastroesophageal reflux disease)   . Hypertension   . 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  . Wears glasses     Past Surgical History:  Procedure Laterality Date  . ABDOMINAL HYSTERECTOMY  3/09  . ABLATION OF DYSRHYTHMIC FOCUS  10/07/2012   SVT  . APPENDECTOMY    . FOOT SURGERY  2000   rt foot fx  . HARDWARE REMOVAL  2003   rt arm  . MASS EXCISION Left 07/07/2013   Procedure: EXCISION MASS LEFT RING FINGER;  Surgeon: Wynonia Sours, MD;  Location: [facility];  Service: Orthopedics;  Laterality: Left;  . REPAIR EXTENSOR TENDON Left 07/07/2013   Procedure: REPAIR EXTENSOR TENDON  LEFT WITH DEBRIDMENT PROXIMAL INTERPHALANGEAL JOINT;  Surgeon: Wynonia Sours, MD;  Location: [facility];  Service: Orthopedics;  Laterality: Left;  . right arm reconstruction  1/01  . SUPRAVENTRICULAR TACHYCARDIA ABLATION N/A 10/07/2012   Procedure: SUPRAVENTRICULAR TACHYCARDIA ABLATION;  Surgeon: Evans Lance, MD;  Location: [facility] CATH LAB;  Service: Cardiovascular;  Laterality: N/A;    Current Outpatient Medications  Medication Sig Dispense Refill  . ALBUTEROL IN Inhale into the lungs.    . cyclobenzaprine (FLEXERIL) 10 MG tablet TAKE ONE TABLET BY MOUTH AT BEDTIME 30 tablet 11  . DEXILANT 60 MG capsule TAKE ONE CAPSULE BY MOUTH ONCE DAILY 30 capsule 11  . diclofenac (VOLTAREN) 75 MG EC tablet TAKE 1 TABLET BY MOUTH ONCE DAILY 30 tablet 5  . diphenhydrAMINE (SOMINEX) 25 MG tablet Take by mouth.    . montelukast (SINGULAIR) 10 MG tablet TAKE ONE TABLET BY MOUTH ONCE DAILY WITH BREAKFAST 30 tablet 11  . Multiple Vitamins-Minerals (MULTIVITAMIN PO) Take by mouth daily.    Marland Kitchen venlafaxine XR (EFFEXOR-XR) 37.5 MG 24 hr capsule TAKE 1 - 2 CAPSULES BY MOUTH ONCE DAILY 180 capsule 4   No current facility-administered medications for this visit.     Family History  Problem Relation Age of Onset  . Alcohol abuse Mother   . Diabetes Mother   . Hyperlipidemia  Mother   . Hypertension Mother   . Heart disease Mother        Rheumatic disease  . Alzheimer's disease Father   . Diabetes Sister   . Hyperlipidemia Sister   . Hypertension Sister   . Heart attack Maternal Uncle   . Lung cancer Maternal Grandmother        lung  . Heart attack Maternal Grandfather     ROS:  Pertinent items are noted in HPI.  Otherwise, a comprehensive ROS was negative.  Exam:   BP 120/80 (BP Location: Right Arm, Patient Position: Sitting, Cuff Size: Large)   Pulse 74   Resp 14   Ht 5\' 4"  (1.626 m)   Wt 204 lb (92.5 kg)   LMP 10/28/2006   BMI 35.02 kg/m   Weight change: +18#   Height: 5\' 4"  (162.6  cm)  Ht Readings from Last 3 Encounters:  09/08/17 5\' 4"  (1.626 m)  12/09/16 5\' 5"  (1.651 m)  07/09/16 5\' 5"  (1.651 m)    General appearance: alert, cooperative and appears stated age Head: Normocephalic, without obvious abnormality, atraumatic Neck: no adenopathy, supple, symmetrical, trachea midline and thyroid normal to inspection and palpation Lungs: clear to auscultation bilaterally Breasts: normal appearance, no masses or tenderness Heart: regular rate and rhythm Abdomen: soft, non-tender; bowel sounds normal; no masses,  no organomegaly Extremities: extremities normal, atraumatic, no cyanosis or edema Skin: Skin color, texture, turgor normal. No rashes or lesions Lymph nodes: Cervical, supraclavicular, and axillary nodes normal. No abnormal inguinal nodes palpated Neurologic: Grossly normal   Pelvic: External genitalia:  no lesions              Urethra:  normal appearing urethra with no masses, tenderness or lesions              Bartholins and Skenes: normal                 Vagina: normal appearing vagina with normal color and discharge, no lesions              Cervix: absent              Pap taken: No. Bimanual Exam:  Uterus:  uterus absent              Adnexa: no mass, fullness, tenderness               Rectovaginal: Confirms               Anus:  normal sphincter tone, no lesions  Chaperone was present for exam.  A:  Well Woman with normal exam H/o TAH/RSO No HRT Osteopenia H/O colon polyps, needs follow-up 10 years H/O SVT with cardiac ablation 10/07/12 H/O depression, much improved  P:   Mammogram guidelines reviewed.  Discused 3D MMG pap smear not obtained RF for Effexor-XR 37.5mg  daily.  #180/4RF. Lab work and vaccines are UTD Colonoscopy is UTD return annually or prn

## 2017-09-09 ENCOUNTER — Ambulatory Visit: Payer: BLUE CROSS/BLUE SHIELD | Admitting: Physician Assistant

## 2017-09-09 ENCOUNTER — Encounter: Payer: Self-pay | Admitting: Physician Assistant

## 2017-09-09 VITALS — BP 138/87 | HR 95 | Temp 98.1°F | Ht 64.0 in | Wt 201.0 lb

## 2017-09-09 DIAGNOSIS — J0140 Acute pansinusitis, unspecified: Secondary | ICD-10-CM

## 2017-09-09 MED ORDER — AMOXICILLIN-POT CLAVULANATE 875-125 MG PO TABS: 1.0000 | ORAL_TABLET | Freq: Two times a day (BID) | ORAL | 0 refills | Status: DC

## 2017-09-09 MED ORDER — FLUTICASONE PROPIONATE 50 MCG/ACT NA SUSP: 2.0000 | Freq: Every day | NASAL | 5 refills | Status: DC

## 2017-09-09 NOTE — Progress Notes (Signed)
Subjective:    Patient ID: Tracy Landry, female    DOB: 1961/01/21, 57 y.o.   MRN: [id number]  HPI  Patient is a 57 year old female who presents to the clinic with 1 month of sinus pressure, nasal congestion, cough, ear popping, ear ringing and just overall pressure in her head.  She started getting cold-like symptoms and then they seem to resolve.  Then they came back and they seem to resolve again.  She does feels like she cannot get over this.  She denies any fever up until this morning where it was 101.  She denies any body aches.  She is taking NyQuil and DayQuil.  She is also using a Nettie pot.  She denies any known sick contacts.  .. Active Ambulatory Problems    Diagnosis Date Noted  . HYPERLIPIDEMIA 08/22/2006  . OBESITY NOS 06/01/2006  . VIRAL URI 07/14/2007  . RHINITIS, ALLERGIC 05/13/2006  . ASTHMA, PERSISTENT, MILD 08/12/2006  . GASTROESOPHAGEAL REFLUX, NO ESOPHAGITIS 05/13/2006  . ANKYLOSING SPONDYLITIS 01/06/2007  . OSTEOPENIA 05/13/2006  . PALPITATIONS 08/03/2007  . COUGH 05/15/2010  . CHEST PAIN, RIGHT 05/15/2010  . SVT (supraventricular tachycardia) ([place]) 04/15/2012  . Essential hypertension, malignant 04/15/2012  . Fatigue 04/15/2012  . Essential hypertension 07/15/2012  . Thrush of mouth and esophagus ([place]) 03/26/2013  . Right shoulder pain 02/03/2014  . Peroneal tendinitis of left lower leg 04/01/2014  . Plantar fasciitis, left 06/09/2014  . Left tibialis posterior tendonitis 08/08/2014  . Anxiety state 03/28/2015  . Puncture wound 09/13/2015  . Low back pain 02/22/2016  . Primary osteoarthritis of both knees 05/08/2016  . Periscapular pain 06/03/2016  . Right wrist pain 09/26/2016  . Depression 03/15/2011   Resolved Ambulatory Problems    Diagnosis Date Noted  . No Resolved Ambulatory Problems   Past Medical History:  Diagnosis Date  . Asthma   . Back pain   . Depression   . GERD (gastroesophageal reflux disease)   . Hypertension   . SVT  (supraventricular tachycardia) ([place])   . Wears glasses       Review of Systems  All other systems reviewed and are negative.      Objective:   Physical Exam  Constitutional: She is oriented to person, place, and time. She appears well-developed and well-nourished.  HENT:  Head: Normocephalic and atraumatic.  Right Ear: External ear normal.  Left Ear: External ear normal.  Mouth/Throat: Oropharynx is clear and moist. No oropharyngeal exudate.  TM"s dull with decreased llight reflex.  Tenderness over maxillary, frontal and ethmoid sinuses to palpation. Nasal turbinates red and swollen.   Eyes: Conjunctivae are normal. Right eye exhibits no discharge. Left eye exhibits no discharge.  Neck: No thyromegaly present.  Cardiovascular: Normal rate, regular rhythm and normal heart sounds.  Pulmonary/Chest: Effort normal and breath sounds normal.  Lymphadenopathy:    She has cervical adenopathy.  Neurological: She is alert and oriented to person, place, and time.  Psychiatric: She has a normal mood and affect. Her behavior is normal.          Assessment & Plan:  Marland KitchenMarland KitchenBayla was seen today for fever.  Diagnoses and all orders for this visit:  Acute non-recurrent pansinusitis -     amoxicillin-clavulanate (AUGMENTIN) 875-125 MG tablet; Take 1 tablet by mouth 2 (two) times daily. -     fluticasone (FLONASE) 50 MCG/ACT nasal spray; Place 2 sprays into both nostrils daily.   Discussed symptomatic care. With symptoms persisting for over 1 month  will treat with abx and flonase. Follow up or call if symptoms not improving. Continue nettie pot with distilled water only. Follow up as needed.

## 2017-09-09 NOTE — Patient Instructions (Signed)

## 2017-09-10 ENCOUNTER — Ambulatory Visit: Payer: BLUE CROSS/BLUE SHIELD | Admitting: Physician Assistant

## 2017-09-10 ENCOUNTER — Encounter: Payer: Self-pay | Admitting: Physician Assistant

## 2017-09-11 MED ORDER — VENLAFAXINE HCL ER 37.5 MG PO CP24: ORAL_CAPSULE | ORAL | 4 refills | Status: DC

## 2017-09-15 ENCOUNTER — Other Ambulatory Visit: Payer: Self-pay | Admitting: Physician Assistant

## 2017-09-15 MED ORDER — AZITHROMYCIN 250 MG PO TABS: ORAL_TABLET | ORAL | 0 refills | Status: DC

## 2017-09-22 ENCOUNTER — Ambulatory Visit: Payer: BLUE CROSS/BLUE SHIELD | Admitting: Physician Assistant

## 2017-09-22 ENCOUNTER — Encounter: Payer: Self-pay | Admitting: Physician Assistant

## 2017-09-22 VITALS — BP 117/78 | HR 106 | Temp 98.3°F | Ht 64.0 in

## 2017-09-22 DIAGNOSIS — J0110 Acute frontal sinusitis, unspecified: Secondary | ICD-10-CM | POA: Diagnosis not present

## 2017-09-22 DIAGNOSIS — J3089 Other allergic rhinitis: Secondary | ICD-10-CM

## 2017-09-22 DIAGNOSIS — I10 Essential (primary) hypertension: Secondary | ICD-10-CM | POA: Diagnosis not present

## 2017-09-22 DIAGNOSIS — R05 Cough: Secondary | ICD-10-CM | POA: Diagnosis not present

## 2017-09-22 DIAGNOSIS — R059 Cough, unspecified: Secondary | ICD-10-CM

## 2017-09-22 MED ORDER — METHYLPREDNISOLONE SODIUM SUCC 125 MG IJ SOLR
125.0000 mg | Freq: Once | INTRAMUSCULAR | Status: AC
  Administered 2017-09-22: 125 mg via INTRAMUSCULAR

## 2017-09-22 MED ORDER — CEFTRIAXONE SODIUM 1 G IJ SOLR
1.0000 g | Freq: Once | INTRAMUSCULAR | Status: AC
  Administered 2017-09-22: 1 g via INTRAMUSCULAR

## 2017-09-22 MED ORDER — PREDNISONE 20 MG PO TABS: ORAL_TABLET | ORAL | 0 refills | Status: DC

## 2017-09-22 MED ORDER — CEFTRIAXONE SODIUM 1 G IJ SOLR: 1.0000 g | Freq: Once | INTRAMUSCULAR | 0 refills | Status: AC

## 2017-09-22 MED ORDER — HYDROCOD POLST-CPM POLST ER 10-8 MG/5ML PO SUER: 5.0000 mL | Freq: Two times a day (BID) | ORAL | 0 refills | Status: DC | PRN

## 2017-09-22 NOTE — Progress Notes (Signed)
Subjective:    Patient ID: Tracy Landry, female    DOB: 1960/11/19, 57 y.o.   MRN: [id number]  HPI  Patient is a 57 year old pleasant female with asthma and allergic rhinitis who presents to the clinic with continual sinus pressure, postnasal drip and cough. symptoms have now been present for 6 weeks. She did not tolerate amoxicillin. zpak was finished. She is using flonase daily. Fever low grade and off and on. She is using a lot of tylenol for frontal headache. She denies any wheezing. She does feel SOB easily. No leg swelling or pain noted.   .. Active Ambulatory Problems    Diagnosis Date Noted  . HYPERLIPIDEMIA 08/22/2006  . OBESITY NOS 06/01/2006  . ASTHMA, PERSISTENT, MILD 08/12/2006  . GASTROESOPHAGEAL REFLUX, NO ESOPHAGITIS 05/13/2006  . ANKYLOSING SPONDYLITIS 01/06/2007  . OSTEOPENIA 05/13/2006  . COUGH 05/15/2010  . SVT (supraventricular tachycardia) ([place]) 04/15/2012  . Fatigue 04/15/2012  . Essential hypertension 07/15/2012  . Peroneal tendinitis of left lower leg 04/01/2014  . Plantar fasciitis, left 06/09/2014  . Left tibialis posterior tendonitis 08/08/2014  . Anxiety state 03/28/2015  . Low back pain 02/22/2016  . Primary osteoarthritis of both knees 05/08/2016  . Periscapular pain 06/03/2016  . Depression 03/15/2011  . Closed Colles' fracture of right radius 10/30/2015   Resolved Ambulatory Problems    Diagnosis Date Noted  . VIRAL URI 07/14/2007  . RHINITIS, ALLERGIC 05/13/2006  . Palpitations 08/03/2007  . CHEST PAIN, RIGHT 05/15/2010  . Essential hypertension, malignant 04/15/2012  . Thrush of mouth and esophagus ([place]) 03/26/2013  . Right shoulder pain 02/03/2014  . Puncture wound 09/13/2015  . Right wrist pain 09/26/2016   Past Medical History:  Diagnosis Date  . Asthma   . Back pain   . Depression   . GERD (gastroesophageal reflux disease)   . Hypertension   . SVT (supraventricular tachycardia) ([place])   . Wears glasses       Review of  Systems    see HPI.  Objective:   Physical Exam  Constitutional: She is oriented to person, place, and time. She appears well-developed and well-nourished.  HENT:  Head: Normocephalic and atraumatic.  Right Ear: External ear normal.  Left Ear: External ear normal.  Nose: Nose normal.  Mouth/Throat: Oropharynx is clear and moist. No oropharyngeal exudate.  TM's clear bilaterally.  Tenderness over frontal sinuses and nasal bridge.  Oropharynx erythematous with PND. No tonsil swelling or exudate.   Eyes: Right eye exhibits no discharge. Left eye exhibits no discharge.  Injected irritated conjunctiva bilateral.   Neck: Normal range of motion. Neck supple. No thyromegaly present.  Cardiovascular: Normal rate, regular rhythm and normal heart sounds.  Pulmonary/Chest: Effort normal and breath sounds normal.  Lymphadenopathy:    She has no cervical adenopathy.  Neurological: She is alert and oriented to person, place, and time.  Psychiatric: She has a normal mood and affect. Her behavior is normal.          Assessment & Plan:  Marland KitchenMarland KitchenDiagnoses and all orders for this visit:  Subacute frontal sinusitis -     predniSONE (DELTASONE) 20 MG tablet; Take 3 tablets for 3 tablets, then for 2 tablets for 3 days, take 1 tablet for 3 days, take 1/2 tablet for 4 days. -     methylPREDNISolone sodium succinate (SOLU-MEDROL) 125 mg/2 mL injection 125 mg -     cefTRIAXone (ROCEPHIN) 1 g injection; Inject 1 g into the muscle once for 1 dose. -  cefTRIAXone (ROCEPHIN) injection 1 g  Essential hypertension  Cough -     chlorpheniramine-HYDROcodone (TUSSIONEX) 10-8 MG/5ML SUER; Take 5 mLs by mouth every 12 (twelve) hours as needed for cough (cough, will cause drowsiness.). -     predniSONE (DELTASONE) 20 MG tablet; Take 3 tablets for 3 tablets, then for 2 tablets for 3 days, take 1 tablet for 3 days, take 1/2 tablet for 4 days. -     methylPREDNISolone sodium succinate (SOLU-MEDROL) 125 mg/2 mL  injection 125 mg -     cefTRIAXone (ROCEPHIN) 1 g injection; Inject 1 g into the muscle once for 1 dose. -     cefTRIAXone (ROCEPHIN) injection 1 g  Non-seasonal allergic rhinitis, unspecified trigger   Shot of rocephin 1g given with solumedrol 125mg . Start prednisone tomorrow. Continue flonase. tussinex for cough. Start zyrtec/claritin daily to help with any allergy symptoms. Follow up as needed.     [place] controlled substance database reviewed with no concerns.

## 2017-09-23 NOTE — Telephone Encounter (Signed)
Received fax for pa approval for Dexilant 60 mg that was approved from 09/22/2017 until 09/20/2020.  Reference Number: [id number].

## 2017-11-04 ENCOUNTER — Ambulatory Visit: Payer: BLUE CROSS/BLUE SHIELD | Admitting: Podiatry

## 2017-11-04 ENCOUNTER — Encounter: Payer: Self-pay | Admitting: Podiatry

## 2017-11-04 DIAGNOSIS — D3610 Benign neoplasm of peripheral nerves and autonomic nervous system, unspecified: Secondary | ICD-10-CM

## 2017-11-04 DIAGNOSIS — M7750 Other enthesopathy of unspecified foot: Secondary | ICD-10-CM

## 2017-11-04 DIAGNOSIS — M778 Other enthesopathies, not elsewhere classified: Secondary | ICD-10-CM

## 2017-11-04 DIAGNOSIS — M779 Enthesopathy, unspecified: Secondary | ICD-10-CM

## 2017-11-04 NOTE — Progress Notes (Signed)
She presents today chief complaint of pain to the first metatarsophalangeal joint of the left foot states that is tender when she bears weight on it and extends the joint as she comes over top of the foot.  She is also complaining of pain to the third interdigital neuroma was removed but resulted in a stump neuroma because of noncompliance postoperatively.  Objective: Vital signs are stable alert and oriented x3.  Pulses are palpable.  She has pain on end range of motion of the first metatarsal phalangeal joint of the left foot.  She also has tenderness on palpation to the third interdigital space of the left foot with a palpable Mulder's click.  Assessment: Capsulitis first metatarsophalangeal joint left foot.  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  Plan: Discussed etiology pathology conservative versus surgical therapy after an alcohol prep I injected 2 mg of dexamethasone to the first metatarsophalangeal joint of the right foot with local anesthetic.  She tolerated this procedure well.  Also I injected the third interdigital space with 20 mg Kenalog 5 mg Marcaine point maximal tenderness.  She tolerated procedure well without complications and I will follow-up with her in 1 month if necessary.  If there is residual pain to the third intermetatarsal space we will start with dehydrated alcohol.

## 2017-11-06 ENCOUNTER — Other Ambulatory Visit: Payer: Self-pay | Admitting: Physician Assistant

## 2017-11-18 ENCOUNTER — Ambulatory Visit: Payer: Self-pay | Admitting: Obstetrics & Gynecology

## 2017-12-09 ENCOUNTER — Other Ambulatory Visit: Payer: Self-pay | Admitting: Physician Assistant

## 2017-12-09 ENCOUNTER — Ambulatory Visit: Payer: BLUE CROSS/BLUE SHIELD | Admitting: Podiatry

## 2017-12-26 ENCOUNTER — Encounter: Payer: Self-pay | Admitting: Sports Medicine

## 2017-12-26 ENCOUNTER — Ambulatory Visit (INDEPENDENT_AMBULATORY_CARE_PROVIDER_SITE_OTHER): Payer: BLUE CROSS/BLUE SHIELD | Admitting: Sports Medicine

## 2017-12-26 ENCOUNTER — Ambulatory Visit (INDEPENDENT_AMBULATORY_CARE_PROVIDER_SITE_OTHER): Payer: BLUE CROSS/BLUE SHIELD

## 2017-12-26 DIAGNOSIS — S8992XA Unspecified injury of left lower leg, initial encounter: Secondary | ICD-10-CM | POA: Diagnosis not present

## 2017-12-26 DIAGNOSIS — M25562 Pain in left knee: Secondary | ICD-10-CM | POA: Diagnosis not present

## 2017-12-26 DIAGNOSIS — M170 Bilateral primary osteoarthritis of knee: Secondary | ICD-10-CM

## 2017-12-26 NOTE — Assessment & Plan Note (Addendum)
Recent knee injury, I do think she tweaked her meniscus. Also with some pes anserine bursitis. Knee was strapped with a compressive dressing.Left knee aspiration and injection, past anserine bursa injection without guidance.   Return to see me in 1 month.

## 2017-12-26 NOTE — Progress Notes (Signed)
Subjective:    CC: Left knee pain  HPI: Several days ago this pleasant 57 year old female took a misstep and stepped into a hole, resulting in an awkward fall on her left knee.  She had immediate pain, swelling, pain is mostly at the medial joint line, and the pes anserine bursa, moderate, persistent.  No mechanical symptoms.  Localized without radiation.  I reviewed the past medical history, family history, social history, surgical history, and allergies today and no changes were needed.  Please see the problem list section below in epic for further details.  Past Medical History: Past Medical History:  Diagnosis Date  . Asthma   . Back pain   . Depression   . GERD (gastroesophageal reflux disease)   . Hypertension   . 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  . Wears glasses    Past Surgical History: Past Surgical History:  Procedure Laterality Date  . ABDOMINAL HYSTERECTOMY  3/09  . ABLATION OF DYSRHYTHMIC FOCUS  10/07/2012   SVT  . APPENDECTOMY    . FOOT SURGERY  2000   rt foot fx  . HARDWARE REMOVAL  2003   rt arm  . MASS EXCISION Left 07/07/2013   Procedure: EXCISION MASS LEFT RING FINGER;  Surgeon: Wynonia Sours, MD;  Location: [facility];  Service: Orthopedics;  Laterality: Left;  . REPAIR EXTENSOR TENDON Left 07/07/2013   Procedure: REPAIR EXTENSOR TENDON LEFT WITH DEBRIDMENT PROXIMAL INTERPHALANGEAL JOINT;  Surgeon: Wynonia Sours, MD;  Location: [facility];  Service: Orthopedics;  Laterality: Left;  . right arm reconstruction  1/01  . SUPRAVENTRICULAR TACHYCARDIA ABLATION N/A 10/07/2012   Procedure: SUPRAVENTRICULAR TACHYCARDIA ABLATION;  Surgeon: Evans Lance, MD;  Location: [facility] CATH LAB;  Service: Cardiovascular;  Laterality: N/A;   Social History: Social History   Socioeconomic History  . Marital status: Married    Spouse name: Not on file  . Number of children: Not on file  . Years of education: Not  on file  . Highest education level: Not on file  Occupational History    Employer: [employer]    Comment: [employer]  Social Needs  . Financial resource strain: Not on file  . Food insecurity:    Worry: Not on file    Inability: Not on file  . Transportation needs:    Medical: Not on file    Non-medical: Not on file  Tobacco Use  . Smoking status: Never Smoker  . Smokeless tobacco: Never Used  Substance and Sexual Activity  . Alcohol use: Yes    Alcohol/week: 0.0 oz    Comment: 1-2 glasses wine per night  . Drug use: No  . Sexual activity: Yes    Partners: Female    Comment: Female  Lifestyle  . Physical activity:    Days per week: Not on file    Minutes per session: Not on file  . Stress: Not on file  Relationships  . Social connections:    Talks on phone: Not on file    Gets together: Not on file    Attends religious service: Not on file    Active member of club or organization: Not on file    Attends meetings of clubs or organizations: Not on file    Relationship status: Not on file  Other Topics Concern  . Not on file  Social History Narrative  . Not on file   Family History: Family History  Problem Relation Age of Onset  . Alcohol abuse Mother   . Diabetes Mother   . Hyperlipidemia Mother   . Hypertension Mother   . Heart disease Mother        Rheumatic disease  . Alzheimer's disease Father   . Diabetes Sister   . Hyperlipidemia Sister   . Hypertension Sister   . Heart attack Maternal Uncle   . Lung cancer Maternal Grandmother        lung  . Heart attack Maternal Grandfather    Allergies: Allergies  Allergen Reactions  . Chlorhexidine Gluconate Itching  . Amoxicillin-Pot Clavulanate     Diarrhea/abdominal cramping  . Codeine Sulfate Itching  . Influenza Vaccines Swelling    Redness and swelling at the injection site.    Medications: See med rec.  Review of Systems: No fevers, chills, night sweats, weight loss, chest pain, or  shortness of breath.   Objective:    General: Well Developed, well nourished, and in no acute distress.  Neuro: Alert and oriented x3, extra-ocular muscles intact, sensation grossly intact.  HEENT: Normocephalic, atraumatic, pupils equal round reactive to light, neck supple, no masses, no lymphadenopathy, thyroid nonpalpable.  Skin: Warm and dry, no rashes. Cardiac: Regular rate and rhythm, no murmurs rubs or gallops, no lower extremity edema.  Respiratory: Clear to auscultation bilaterally. Not using accessory muscles, speaking in full sentences. Left knee: Palpable effusion, tenderness at the pes anserine bursa and the medial joint line ROM normal in flexion and extension and lower leg rotation. Ligaments with solid consistent endpoints including ACL, PCL, LCL, MCL. Negative Mcmurray's and provocative meniscal tests. Non painful patellar compression. Patellar and quadriceps tendons unremarkable. Hamstring and quadriceps strength is normal.  Procedure:  Injection of left pes bursa Consent obtained and verified. Time-out conducted. Noted no overlying erythema, induration, or other signs of local infection. Skin prepped in a sterile fashion. Topical analgesic spray: Ethyl chloride. Completed without difficulty. Meds: Using a 25-gauge needle I injected 1 cc kenalog 40, 1 cc lidocaine in a fanlike pattern. Pain immediately improved suggesting accurate placement of the medication. Advised to call if fevers/chills, erythema, induration, drainage, or persistent bleeding.  Procedure: Real-time Ultrasound Guided aspiration/injection of left knee Device: GE Logiq E  Verbal informed consent obtained.  Time-out conducted.  Noted no overlying erythema, induration, or other signs of local infection.  Skin prepped in a sterile fashion.  Local anesthesia: Topical Ethyl chloride.  With sterile technique and under real time ultrasound guidance: Aspirated 20 mL of straw-colored fluid, syringe  switched and 1 cc Kenalog 40, 2 cc lidocaine, 2 cc bupivacaine injected easily Completed without difficulty  Pain immediately resolved suggesting accurate placement of the medication.  Advised to call if fevers/chills, erythema, induration, drainage, or persistent bleeding.  Images permanently stored and available for review in the ultrasound unit.  Impression: Technically successful ultrasound guided injection.  The knee was then strapped with a compressive dressing  Impression and Recommendations:    Primary osteoarthritis of both knees Recent knee injury, I do think she tweaked her meniscus. Also with some pes anserine bursitis. Knee was strapped with a compressive dressing.Left knee aspiration and injection, past anserine bursa injection without guidance.   Return to see me in 1 month.    ___________________________________________ Gwen Her. Dianah Field, M.D., ABFM., CAQSM. Primary Care and [place] Instructor of [place] of [facility] Medicine

## 2018-01-23 ENCOUNTER — Ambulatory Visit: Payer: BLUE CROSS/BLUE SHIELD | Admitting: Sports Medicine

## 2018-03-16 ENCOUNTER — Encounter: Payer: Self-pay | Admitting: Sports Medicine

## 2018-03-24 ENCOUNTER — Other Ambulatory Visit: Payer: Self-pay | Admitting: Gastroenterology

## 2018-03-24 DIAGNOSIS — R110 Nausea: Secondary | ICD-10-CM | POA: Diagnosis not present

## 2018-03-24 DIAGNOSIS — R1011 Right upper quadrant pain: Secondary | ICD-10-CM | POA: Diagnosis not present

## 2018-03-24 DIAGNOSIS — K219 Gastro-esophageal reflux disease without esophagitis: Secondary | ICD-10-CM | POA: Diagnosis not present

## 2018-03-26 ENCOUNTER — Encounter: Payer: Self-pay | Admitting: Sports Medicine

## 2018-03-26 ENCOUNTER — Ambulatory Visit: Payer: BLUE CROSS/BLUE SHIELD | Admitting: Sports Medicine

## 2018-03-26 DIAGNOSIS — M1811 Unilateral primary osteoarthritis of first carpometacarpal joint, right hand: Secondary | ICD-10-CM

## 2018-03-26 NOTE — Progress Notes (Signed)
Subjective:    I'm seeing this patient as a consultation for: Iran Planas, PA-C  CC: Right hand pain  HPI: Tracy Landry is a pleasant 57 year old female, for months she is a pain that she localizes in her right hand, over the thumb, at the base.  Moderate, persistent with radiation to the IP joint and proximally to the wrist.  Worse with gripping motions, shaking hands, opening jars.  She has tried some Tylenol but she really cannot take NSAIDs due to peptic ulcer disease.  I reviewed the past medical history, family history, social history, surgical history, and allergies today and no changes were needed.  Please see the problem list section below in epic for further details.  Past Medical History: Past Medical History:  Diagnosis Date  . Asthma   . Back pain   . Depression   . GERD (gastroesophageal reflux disease)   . Hypertension   . 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  . Wears glasses    Past Surgical History: Past Surgical History:  Procedure Laterality Date  . ABDOMINAL HYSTERECTOMY  3/09  . ABLATION OF DYSRHYTHMIC FOCUS  10/07/2012   SVT  . APPENDECTOMY    . FOOT SURGERY  2000   rt foot fx  . HARDWARE REMOVAL  2003   rt arm  . MASS EXCISION Left 07/07/2013   Procedure: EXCISION MASS LEFT RING FINGER;  Surgeon: Wynonia Sours, MD;  Location: [facility];  Service: Orthopedics;  Laterality: Left;  . REPAIR EXTENSOR TENDON Left 07/07/2013   Procedure: REPAIR EXTENSOR TENDON LEFT WITH DEBRIDMENT PROXIMAL INTERPHALANGEAL JOINT;  Surgeon: Wynonia Sours, MD;  Location: [facility];  Service: Orthopedics;  Laterality: Left;  . right arm reconstruction  1/01  . SUPRAVENTRICULAR TACHYCARDIA ABLATION N/A 10/07/2012   Procedure: SUPRAVENTRICULAR TACHYCARDIA ABLATION;  Surgeon: Evans Lance, MD;  Location[facility] C[facility] LAB;  Service: Cardiovascular;  Laterality: N/A;   Social History: Social History   Socioeconomic History  .  Marital status: Married    Spouse name: Not on file  . Number of children: Not on file  . Years of education: Not on file  . Highest education level: Not on file  Occupational History    Employer: [employer]    Comment: [employer]  Social Needs  . Financial resource strain: Not on file  . Food insecurity:    Worry: Not on file    Inability: Not on file  . Transportation needs:    Medical: Not on file    Non-medical: Not on file  Tobacco Use  . Smoking status: Never Smoker  . Smokeless tobacco: Never Used  Substance and Sexual Activity  . Alcohol use: Yes    Comment: 1-2 glasses wine per night  . Drug use: No  . Sexual activity: Yes    Partners: Female    Comment: Female  Lifestyle  . Physical activity:    Days per week: Not on file    Minutes per session: Not on file  . Stress: Not on file  Relationships  . Social connections:    Talks on phone: Not on file    Gets together: Not on file    Attends religious service: Not on file    Active member of club or organization: Not on file    Attends meetings of clubs or organizations: Not on file    Relationship status: Not on file  Other Topics Concern  .  Not on file  Social History Narrative  . Not on file   Family History: Family History  Problem Relation Age of Onset  . Alcohol abuse Mother   . Diabetes Mother   . Hyperlipidemia Mother   . Hypertension Mother   . Heart disease Mother        Rheumatic disease  . Alzheimer's disease Father   . Diabetes Sister   . Hyperlipidemia Sister   . Hypertension Sister   . Heart attack Maternal Uncle   . Lung cancer Maternal Grandmother        lung  . Heart attack Maternal Grandfather    Allergies: Allergies  Allergen Reactions  . Chlorhexidine Gluconate Itching  . Amoxicillin-Pot Clavulanate     Diarrhea/abdominal cramping  . Codeine Sulfate Itching  . Influenza Vaccines Swelling    Redness and swelling at the injection site.    Medications: See  med rec.  Review of Systems: No headache, visual changes, nausea, vomiting, diarrhea, constipation, dizziness, abdominal pain, skin rash, fevers, chills, night sweats, weight loss, swollen lymph nodes, body aches, joint swelling, muscle aches, chest pain, shortness of breath, mood changes, visual or auditory hallucinations.   Objective:   General: Well Developed, well nourished, and in no acute distress.  Neuro:  Extra-ocular muscles intact, able to move all 4 extremities, sensation grossly intact.  Deep tendon reflexes tested were normal. Psych: Alert and oriented, mood congruent with affect. ENT:  Ears and nose appear unremarkable.  Hearing grossly normal. Neck: Unremarkable overall appearance, trachea midline.  No visible thyroid enlargement. Eyes: Conjunctivae and lids appear unremarkable.  Pupils equal and round. Skin: Warm and dry, no rashes noted.  Cardiovascular: Pulses palpable, no extremity edema. Right hand: Tender to palpation of the thumb basal joint, negative Finkelstein sign, no tenderness over the IP joint or metacarpal phalangeal joints.  Neurovascular intact distally.  Procedure: Real-time Ultrasound Guided Injection of right trapeziometacarpal joint Device: GE Logiq E  Verbal informed consent obtained.  Time-out conducted.  Noted no overlying erythema, induration, or other signs of local infection.  Skin prepped in a sterile fashion.  Local anesthesia: Topical Ethyl chloride.  With sterile technique and under real time ultrasound guidance: Using a 25-gauge needle advanced into the trapezial metacarpal joint and injected 1/2 cc kenalog 40, 1/2 cc lidocaine, the joint was distended creating some discomfort so I placed part of the medication peri-articularly. Completed without difficulty  Pain immediately resolved suggesting accurate placement of the medication.  Advised to call if fevers/chills, erythema, induration, drainage, or persistent bleeding.  Images permanently  stored and available for review in the ultrasound unit.  Impression: Technically successful ultrasound guided injection.  Impression and Recommendations:   This case required medical decision making of moderate complexity.  Primary osteoarthritis of first carpometacarpal joint of right hand Injection as above. Strapped with compressive dressing. Avoiding NSAIDs, peptic ulcer disease. Return to see me in 1 month.  ___________________________________________ Gwen Her. Dianah Field, M.D., ABFM., CAQSM. Primary Care and [place] Instructor of [place] of [facility] of Medicine

## 2018-03-26 NOTE — Assessment & Plan Note (Addendum)
Injection as above. Strapped with compressive dressing. Avoiding NSAIDs, peptic ulcer disease. Return to see me in 1 month.

## 2018-04-01 ENCOUNTER — Encounter: Payer: BLUE CROSS/BLUE SHIELD | Admitting: Physician Assistant

## 2018-04-10 ENCOUNTER — Encounter: Payer: Self-pay | Admitting: Physician Assistant

## 2018-04-13 ENCOUNTER — Ambulatory Visit (HOSPITAL_COMMUNITY): Payer: BLUE CROSS/BLUE SHIELD

## 2018-04-23 ENCOUNTER — Ambulatory Visit: Payer: BLUE CROSS/BLUE SHIELD | Admitting: Sports Medicine

## 2018-05-03 ENCOUNTER — Other Ambulatory Visit: Payer: Self-pay | Admitting: Physician Assistant

## 2018-05-07 ENCOUNTER — Ambulatory Visit (INDEPENDENT_AMBULATORY_CARE_PROVIDER_SITE_OTHER): Payer: BLUE CROSS/BLUE SHIELD | Admitting: Family Medicine

## 2018-05-07 DIAGNOSIS — Z23 Encounter for immunization: Secondary | ICD-10-CM | POA: Diagnosis not present

## 2018-05-07 NOTE — Progress Notes (Signed)
Patient presents to clinic today for an influenza vaccine. Patient's temperature was 98.6 (Oral). Vaccine given in the right deltoid with no complications. Casilda Carls, CMA.

## 2018-05-19 ENCOUNTER — Ambulatory Visit: Payer: BLUE CROSS/BLUE SHIELD | Admitting: Sports Medicine

## 2018-05-19 ENCOUNTER — Encounter: Payer: Self-pay | Admitting: Sports Medicine

## 2018-05-19 DIAGNOSIS — M170 Bilateral primary osteoarthritis of knee: Secondary | ICD-10-CM | POA: Diagnosis not present

## 2018-05-19 NOTE — Assessment & Plan Note (Signed)
Right knee aspiration and injection. Fluid was cloudy, adding a crystal analysis.   Return as needed.

## 2018-05-19 NOTE — Progress Notes (Signed)
Subjective:    CC: Right knee pain  HPI: This is a pleasant 57 year old female, for the past couple weeks she is had worsening swelling in her right knee, moderate, persistent without radiation, no trauma, no mechanical symptoms.  I reviewed the past medical history, family history, social history, surgical history, and allergies today and no changes were needed.  Please see the problem list section below in epic for further details.  Past Medical History: Past Medical History:  Diagnosis Date  . Asthma   . Back pain   . Depression   . GERD (gastroesophageal reflux disease)   . Hypertension   . 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  . Wears glasses    Past Surgical History: Past Surgical History:  Procedure Laterality Date  . ABDOMINAL HYSTERECTOMY  3/09  . ABLATION OF DYSRHYTHMIC FOCUS  10/07/2012   SVT  . APPENDECTOMY    . FOOT SURGERY  2000   rt foot fx  . HARDWARE REMOVAL  2003   rt arm  . MASS EXCISION Left 07/07/2013   Procedure: EXCISION MASS LEFT RING FINGER;  Surgeon: Wynonia Sours, MD;  Location: [facility];  Service: Orthopedics;  Laterality: Left;  . REPAIR EXTENSOR TENDON Left 07/07/2013   Procedure: REPAIR EXTENSOR TENDON LEFT WITH DEBRIDMENT PROXIMAL INTERPHALANGEAL JOINT;  Surgeon: Wynonia Sours, MD;  Location: [facility];  Service: Orthopedics;  Laterality: Left;  . right arm reconstruction  1/01  . SUPRAVENTRICULAR TACHYCARDIA ABLATION N/A 10/07/2012   Procedure: SUPRAVENTRICULAR TACHYCARDIA ABLATION;  Surgeon: Evans Lance, MD;  [facility];  Service: Cardiovascular;  Laterality: N/A;   Social History: Social History   Socioeconomic History  . Marital status: Married    Spouse name: Not on file  . Number of children: Not on file  . Years of education: Not on file  . Highest education level: Not on file  Occupational History    Employer: [employer]    Comment:  [place]  Social Needs  . Financial resource strain: Not on file  . Food insecurity:    Worry: Not on file    Inability: Not on file  . Transportation needs:    Medical: Not on file    Non-medical: Not on file  Tobacco Use  . Smoking status: Never Smoker  . Smokeless tobacco: Never Used  Substance and Sexual Activity  . Alcohol use: Yes    Comment: 1-2 glasses wine per night  . Drug use: No  . Sexual activity: Yes    Partners: Female    Comment: Female  Lifestyle  . Physical activity:    Days per week: Not on file    Minutes per session: Not on file  . Stress: Not on file  Relationships  . Social connections:    Talks on phone: Not on file    Gets together: Not on file    Attends religious service: Not on file    Active member of club or organization: Not on file    Attends meetings of clubs or organizations: Not on file    Relationship status: Not on file  Other Topics Concern  . Not on file  Social History Narrative  . Not on file   Family History: Family History  Problem Relation Age of Onset  . Alcohol abuse Mother   . Diabetes Mother   . Hyperlipidemia Mother   . Hypertension Mother   . Heart disease  Mother        Rheumatic disease  . Alzheimer's disease Father   . Diabetes Sister   . Hyperlipidemia Sister   . Hypertension Sister   . Heart attack Maternal Uncle   . Lung cancer Maternal Grandmother        lung  . Heart attack Maternal Grandfather    Allergies: Allergies  Allergen Reactions  . Chlorhexidine Gluconate Itching  . Amoxicillin-Pot Clavulanate     Diarrhea/abdominal cramping  . Codeine Sulfate Itching  . Influenza Vaccines Swelling    Redness and swelling at the injection site.    Medications: See med rec.  Review of Systems: No fevers, chills, night sweats, weight loss, chest pain, or shortness of breath.   Objective:    General: Well Developed, well nourished, and in no acute distress.  Neuro: Alert and oriented x3,  extra-ocular muscles intact, sensation grossly intact.  HEENT: Normocephalic, atraumatic, pupils equal round reactive to light, neck supple, no masses, no lymphadenopathy, thyroid nonpalpable.  Skin: Warm and dry, no rashes. Cardiac: Regular rate and rhythm, no murmurs rubs or gallops, no lower extremity edema.  Respiratory: Clear to auscultation bilaterally. Not using accessory muscles, speaking in full sentences. Right knee: Visibly swollen with a palpable fluid wave, effusion.   Minimal tenderness at the medial joint line. ROM normal in flexion and extension and lower leg rotation. Ligaments with solid consistent endpoints including ACL, PCL, LCL, MCL. Negative Mcmurray's and provocative meniscal tests. Non painful patellar compression. Patellar and quadriceps tendons unremarkable. Hamstring and quadriceps strength is normal.  Procedure: Real-time Ultrasound Guided aspiration/injection of right knee Device: GE Logiq E  Verbal informed consent obtained.  Time-out conducted.  Noted no overlying erythema, induration, or other signs of local infection.  Skin prepped in a sterile fashion.  Local anesthesia: Topical Ethyl chloride.  With sterile technique and under real time ultrasound guidance: I advanced an 18-gauge needle into the knee in the suprapatellar recess, and aspirated approximately 30 mL of slightly cloudy, straw-colored fluid, syringe switched and 1 cc kenalog 40, 2 cc lidocaine, 2 cc bupivacaine injected easily. Completed without difficulty  Pain immediately resolved suggesting accurate placement of the medication.  Advised to call if fevers/chills, erythema, induration, drainage, or persistent bleeding.  Images permanently stored and available for review in the ultrasound unit.  Impression: Technically successful ultrasound guided injection.  Impression and Recommendations:    Primary osteoarthritis of both knees Right knee aspiration and injection. Fluid was cloudy,  adding a crystal analysis.   Return as needed. ___________________________________________ Gwen Her. Dianah Field, M.D., ABFM., CAQSM. Primary Care and Sports Medicine [facility]  Adjunct Professor of [place] [employer] [facility] [employer]

## 2018-05-20 LAB — CELL COUNT + DIFF, W/O CRYST-SYNVL FLD
Basophils, %: 0 %
Eosinophils-Synovial: 0 % (ref 0–2)
Lymphocytes-Synovial Fld: 76 % — ABNORMAL HIGH (ref 0–74)
Monocyte/Macrophage: 15 % (ref 0–69)
Neutrophil, Synovial: 9 % (ref 0–24)
Synoviocytes, %: 0 % (ref 0–15)
WBC, Synovial: 107 cells/uL (ref <150)

## 2018-05-20 LAB — SYNOVIAL FLUID, CRYSTAL

## 2018-07-07 ENCOUNTER — Encounter: Payer: Self-pay | Admitting: Podiatry

## 2018-07-07 ENCOUNTER — Other Ambulatory Visit: Payer: Self-pay | Admitting: Podiatry

## 2018-07-07 ENCOUNTER — Ambulatory Visit (INDEPENDENT_AMBULATORY_CARE_PROVIDER_SITE_OTHER): Payer: BLUE CROSS/BLUE SHIELD

## 2018-07-07 ENCOUNTER — Ambulatory Visit: Payer: BLUE CROSS/BLUE SHIELD | Admitting: Podiatry

## 2018-07-07 DIAGNOSIS — M722 Plantar fascial fibromatosis: Secondary | ICD-10-CM | POA: Diagnosis not present

## 2018-07-07 DIAGNOSIS — D3610 Benign neoplasm of peripheral nerves and autonomic nervous system, unspecified: Secondary | ICD-10-CM

## 2018-07-08 NOTE — Progress Notes (Signed)
She presents today states that these knots are starting to bother her plantar aspect of the left foot.  Been bothering me for about a year or so now have gotten bigger over the past 2 to 3 months particular since have been on the more started to get a little cramping in the bottom of my foot.  Objective: Vital signs are stable she is alert and oriented x3.  Pulses are palpable.  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  Assessment: Letter fibromatosis medial band left foot.  Plan: Discussed etiology pathology and surgical therapies this point time went ahead and injected 20 mg Kenalog 5 mg Marcaine point maximal tenderness injecting it directly into the lesion itself.  After sterile Betadine skin prep she tolerated this well without complications.  Follow-up with her in about 2 months just to recheck this.

## 2018-09-01 ENCOUNTER — Ambulatory Visit: Payer: BLUE CROSS/BLUE SHIELD | Admitting: Podiatry

## 2018-09-14 ENCOUNTER — Other Ambulatory Visit: Payer: Self-pay | Admitting: Obstetrics & Gynecology

## 2018-09-14 MED ORDER — VENLAFAXINE HCL ER 37.5 MG PO CP24: ORAL_CAPSULE | ORAL | 4 refills | Status: DC

## 2018-09-15 DIAGNOSIS — Z981 Arthrodesis status: Secondary | ICD-10-CM | POA: Diagnosis not present

## 2018-09-15 DIAGNOSIS — M720 Palmar fascial fibromatosis [Dupuytren]: Secondary | ICD-10-CM | POA: Diagnosis not present

## 2018-09-15 DIAGNOSIS — M180 Bilateral primary osteoarthritis of first carpometacarpal joints: Secondary | ICD-10-CM | POA: Diagnosis not present

## 2018-09-30 DIAGNOSIS — K219 Gastro-esophageal reflux disease without esophagitis: Secondary | ICD-10-CM | POA: Diagnosis not present

## 2018-09-30 DIAGNOSIS — I10 Essential (primary) hypertension: Secondary | ICD-10-CM | POA: Diagnosis not present

## 2018-09-30 DIAGNOSIS — F341 Dysthymic disorder: Secondary | ICD-10-CM | POA: Diagnosis not present

## 2018-09-30 DIAGNOSIS — M720 Palmar fascial fibromatosis [Dupuytren]: Secondary | ICD-10-CM | POA: Insufficient documentation

## 2018-09-30 DIAGNOSIS — Z1239 Encounter for other screening for malignant neoplasm of breast: Secondary | ICD-10-CM | POA: Diagnosis not present

## 2018-10-07 DIAGNOSIS — Z1231 Encounter for screening mammogram for malignant neoplasm of breast: Secondary | ICD-10-CM | POA: Diagnosis not present

## 2018-12-11 ENCOUNTER — Other Ambulatory Visit: Payer: Self-pay | Admitting: Physician Assistant

## 2018-12-11 NOTE — Telephone Encounter (Signed)
Please call patient and schedule virtual/office visit with Jade for follow up prior to refills. Thanks!  

## 2018-12-11 NOTE — Telephone Encounter (Signed)
Patient switched insurances and can not come to see PCP at this time. But she is getting it switched and can come next year. No further questions at this time and will get meds through other pcp.

## 2019-02-09 ENCOUNTER — Other Ambulatory Visit: Payer: Self-pay | Admitting: Physician Assistant

## 2019-02-12 DIAGNOSIS — M791 Myalgia, unspecified site: Secondary | ICD-10-CM | POA: Diagnosis not present

## 2019-02-12 DIAGNOSIS — R509 Fever, unspecified: Secondary | ICD-10-CM | POA: Diagnosis not present

## 2019-02-12 DIAGNOSIS — Z20828 Contact with and (suspected) exposure to other viral communicable diseases: Secondary | ICD-10-CM | POA: Diagnosis not present

## 2019-02-12 DIAGNOSIS — R51 Headache: Secondary | ICD-10-CM | POA: Diagnosis not present

## 2019-02-12 DIAGNOSIS — R05 Cough: Secondary | ICD-10-CM | POA: Diagnosis not present

## 2019-02-12 DIAGNOSIS — J029 Acute pharyngitis, unspecified: Secondary | ICD-10-CM | POA: Diagnosis not present

## 2019-02-19 ENCOUNTER — Encounter: Payer: Self-pay | Admitting: Physician Assistant

## 2019-02-19 ENCOUNTER — Ambulatory Visit (INDEPENDENT_AMBULATORY_CARE_PROVIDER_SITE_OTHER): Payer: BLUE CROSS/BLUE SHIELD | Admitting: Physician Assistant

## 2019-02-19 VITALS — BP 128/84 | HR 83 | Temp 99.2°F | Ht 64.0 in | Wt 205.0 lb

## 2019-02-19 DIAGNOSIS — J0140 Acute pansinusitis, unspecified: Secondary | ICD-10-CM | POA: Diagnosis not present

## 2019-02-19 MED ORDER — AZITHROMYCIN 250 MG PO TABS: ORAL_TABLET | ORAL | 0 refills | Status: DC

## 2019-02-19 MED ORDER — PREDNISONE 50 MG PO TABS: ORAL_TABLET | ORAL | 0 refills | Status: DC

## 2019-02-19 NOTE — Progress Notes (Signed)
..Virtual Visit via Telephone Note  I connected with Tracy Landry on 02/19/19 at  4:20 PM EDT by telephone and verified that I am speaking with the correct person using two identifiers.  Location: Patient: home Provider: clinic   I discussed the limitations, risks, security and privacy concerns of performing an evaluation and management service by telephone and the availability of in person appointments. I also discussed with the patient that there may be a patient responsible charge related to this service. The patient expressed understanding and agreed to proceed.   History of Present Illness: Pt is a 58 yo female with hx of sinusitis who has allergies and works outside in [place] calls in for sinus infection symptoms. Last Wednesday she started having symptoms. She called her PCP because she has to go to [facility] right now due to insurance and due to symptoms did covid testing. Testing was negative. She was given symptomatic care for sinus infection. She is not getting any better. She has a lot of sinus pressure, congestion, she is blowing out green snot. No fever, chills, SOB. Her ears are achy.   .. Active Ambulatory Problems    Diagnosis Date Noted  . HYPERLIPIDEMIA 08/22/2006  . OBESITY NOS 06/01/2006  . ASTHMA, PERSISTENT, MILD 08/12/2006  . GASTROESOPHAGEAL REFLUX, NO ESOPHAGITIS 05/13/2006  . ANKYLOSING SPONDYLITIS 01/06/2007  . OSTEOPENIA 05/13/2006  . COUGH 05/15/2010  . SVT (supraventricular tachycardia) ([place]) 04/15/2012  . Fatigue 04/15/2012  . Essential hypertension 07/15/2012  . Plantar fasciitis, left 06/09/2014  . Anxiety state 03/28/2015  . Low back pain 02/22/2016  . Primary osteoarthritis of both knees 05/08/2016  . Periscapular pain 06/03/2016  . Depression 03/15/2011  . Primary osteoarthritis of first carpometacarpal joint of right hand 03/26/2018   Resolved Ambulatory Problems    Diagnosis Date Noted  . VIRAL URI 07/14/2007  . RHINITIS, ALLERGIC  05/13/2006  . Palpitations 08/03/2007  . CHEST PAIN, RIGHT 05/15/2010  . Essential hypertension, malignant 04/15/2012  . Thrush of mouth and esophagus ([place]) 03/26/2013  . Right shoulder pain 02/03/2014  . Peroneal tendinitis of left lower leg 04/01/2014  . Left tibialis posterior tendonitis 08/08/2014  . Puncture wound 09/13/2015  . Right wrist pain 09/26/2016  . Closed Colles' fracture of right radius 10/30/2015   Past Medical History:  Diagnosis Date  . Asthma   . Back pain   . GERD (gastroesophageal reflux disease)   . Hypertension   . Wears glasses    Reviewed med, allergy, problem list.     Observations/Objective: No acute distress. Normal breathing.  Normal mood.   .. Today's Vitals   02/19/19 1337  BP: 128/84  Pulse: 83  Temp: 99.2 F (37.3 C)  TempSrc: Oral  Weight: 205 lb (93 kg)  Height: 5\' 4"  (1.626 m)   Body mass index is 35.19 kg/m.   Assessment and Plan: Marland KitchenMarland KitchenTamala was seen today for sinus problem.  Diagnoses and all orders for this visit:  Acute non-recurrent pansinusitis -     azithromycin (ZITHROMAX) 250 MG tablet; 2 tablets now and then one tablet for 4 days. -     predniSONE (DELTASONE) 50 MG tablet; One tab PO daily for 5 days.   Treated for sinusitis. Continue flonase at home. Add zpak and prednisone.   Follow Up Instructions:    I discussed the assessment and treatment plan with the patient. The patient was provided an opportunity to ask questions and all were answered. The patient agreed with the plan and demonstrated an  understanding of the instructions.   The patient was advised to call back or seek an in-person evaluation if the symptoms worsen or if the condition fails to improve as anticipated.  I provided 15 minutes of non-face-to-face time during this encounter.   Iran Planas, PA-C

## 2019-02-19 NOTE — Progress Notes (Signed)
Started Friday - cough and fever.  Was tested for Covid - negative Headache, drainage, fever, cough still present. Thinks she has sinus infection.

## 2019-03-15 ENCOUNTER — Other Ambulatory Visit: Payer: Self-pay | Admitting: Physician Assistant

## 2019-03-15 DIAGNOSIS — M1712 Unilateral primary osteoarthritis, left knee: Secondary | ICD-10-CM | POA: Insufficient documentation

## 2019-03-15 DIAGNOSIS — S8992XA Unspecified injury of left lower leg, initial encounter: Secondary | ICD-10-CM | POA: Diagnosis not present

## 2019-03-19 DIAGNOSIS — M23301 Other meniscus derangements, unspecified lateral meniscus, left knee: Secondary | ICD-10-CM | POA: Diagnosis not present

## 2019-03-19 DIAGNOSIS — M23222 Derangement of posterior horn of medial meniscus due to old tear or injury, left knee: Secondary | ICD-10-CM | POA: Diagnosis not present

## 2019-03-19 DIAGNOSIS — M25462 Effusion, left knee: Secondary | ICD-10-CM | POA: Diagnosis not present

## 2019-03-19 DIAGNOSIS — M1712 Unilateral primary osteoarthritis, left knee: Secondary | ICD-10-CM | POA: Diagnosis not present

## 2019-03-26 DIAGNOSIS — S83242A Other tear of medial meniscus, current injury, left knee, initial encounter: Secondary | ICD-10-CM | POA: Insufficient documentation

## 2019-03-26 DIAGNOSIS — S83242D Other tear of medial meniscus, current injury, left knee, subsequent encounter: Secondary | ICD-10-CM | POA: Diagnosis not present

## 2019-03-26 DIAGNOSIS — M1712 Unilateral primary osteoarthritis, left knee: Secondary | ICD-10-CM | POA: Diagnosis not present

## 2019-03-29 DIAGNOSIS — M170 Bilateral primary osteoarthritis of knee: Secondary | ICD-10-CM | POA: Diagnosis not present

## 2019-04-01 DIAGNOSIS — Z23 Encounter for immunization: Secondary | ICD-10-CM | POA: Diagnosis not present

## 2019-04-01 DIAGNOSIS — Z0000 Encounter for general adult medical examination without abnormal findings: Secondary | ICD-10-CM | POA: Diagnosis not present

## 2019-04-01 DIAGNOSIS — Z833 Family history of diabetes mellitus: Secondary | ICD-10-CM | POA: Insufficient documentation

## 2019-04-01 DIAGNOSIS — E782 Mixed hyperlipidemia: Secondary | ICD-10-CM | POA: Diagnosis not present

## 2019-04-01 DIAGNOSIS — K219 Gastro-esophageal reflux disease without esophagitis: Secondary | ICD-10-CM | POA: Diagnosis not present

## 2019-04-01 DIAGNOSIS — F341 Dysthymic disorder: Secondary | ICD-10-CM | POA: Diagnosis not present

## 2019-04-01 DIAGNOSIS — I10 Essential (primary) hypertension: Secondary | ICD-10-CM | POA: Diagnosis not present

## 2019-04-02 DIAGNOSIS — R7301 Impaired fasting glucose: Secondary | ICD-10-CM | POA: Insufficient documentation

## 2019-04-04 IMAGING — MG DIGITAL SCREENING BILATERAL MAMMOGRAM WITH CAD
4 series · 4 of 4 positions shown · non-contrast
Comparison: Previous exam(s).

CLINICAL DATA: Screening.

EXAM:
DIGITAL SCREENING BILATERAL MAMMOGRAM WITH CAD

[L MLO]
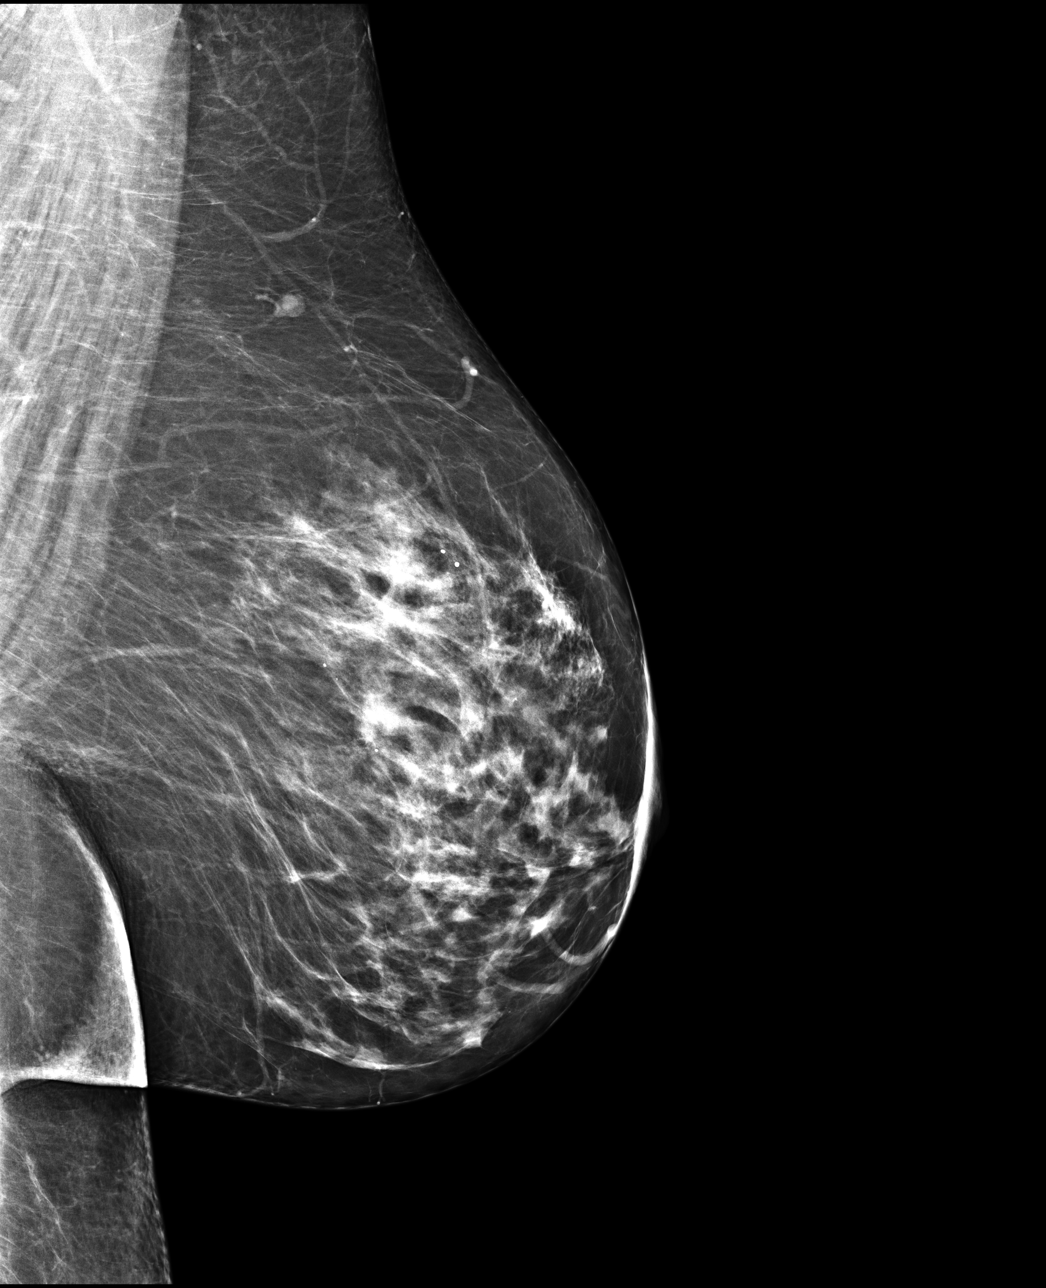

[R MLO]
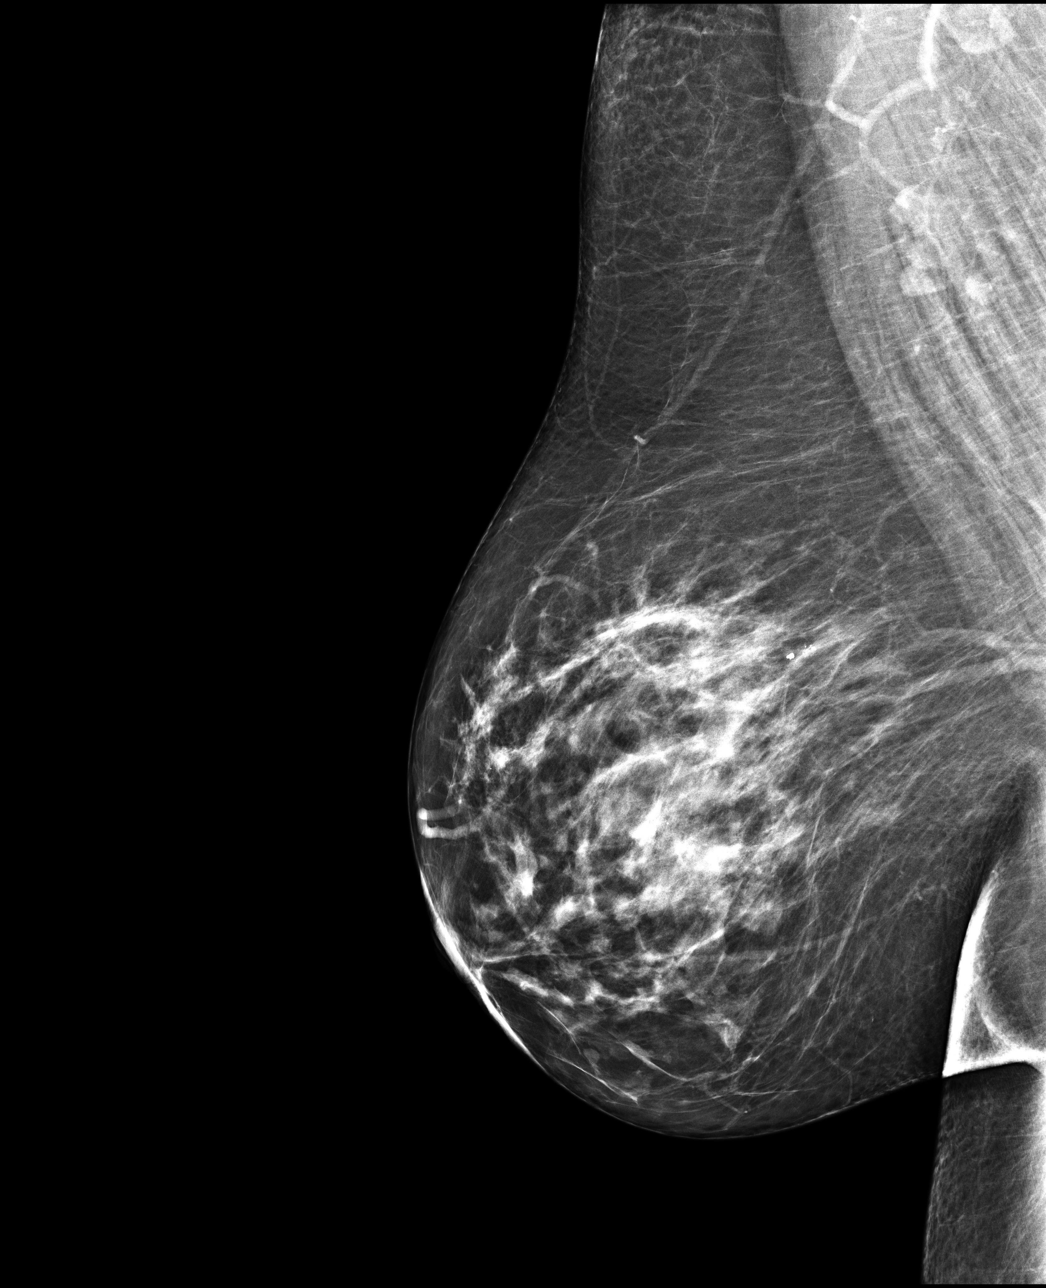

[R CC]
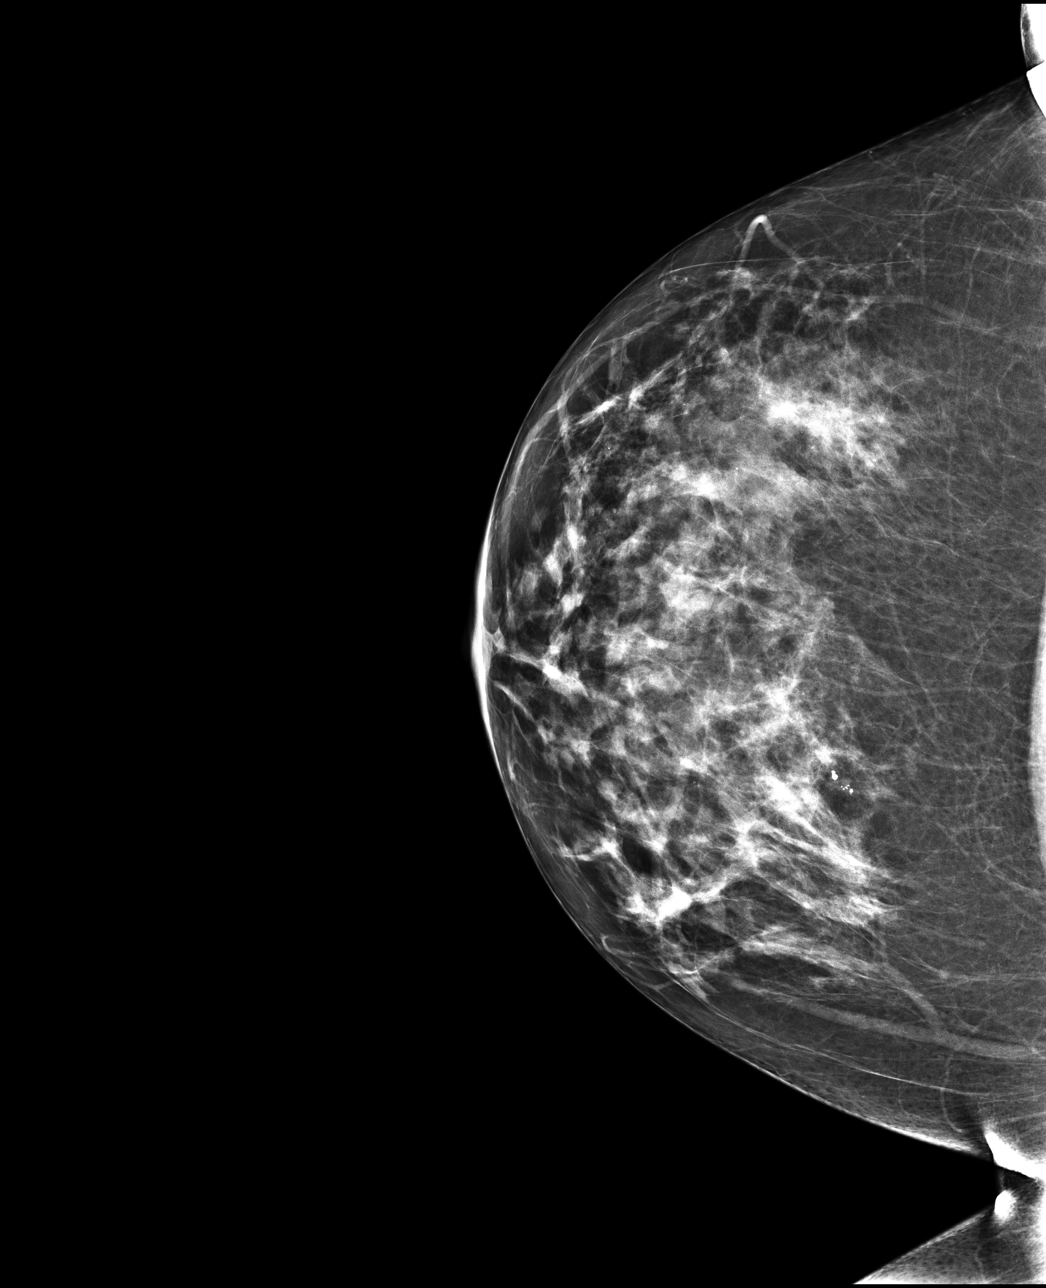

[L CC]
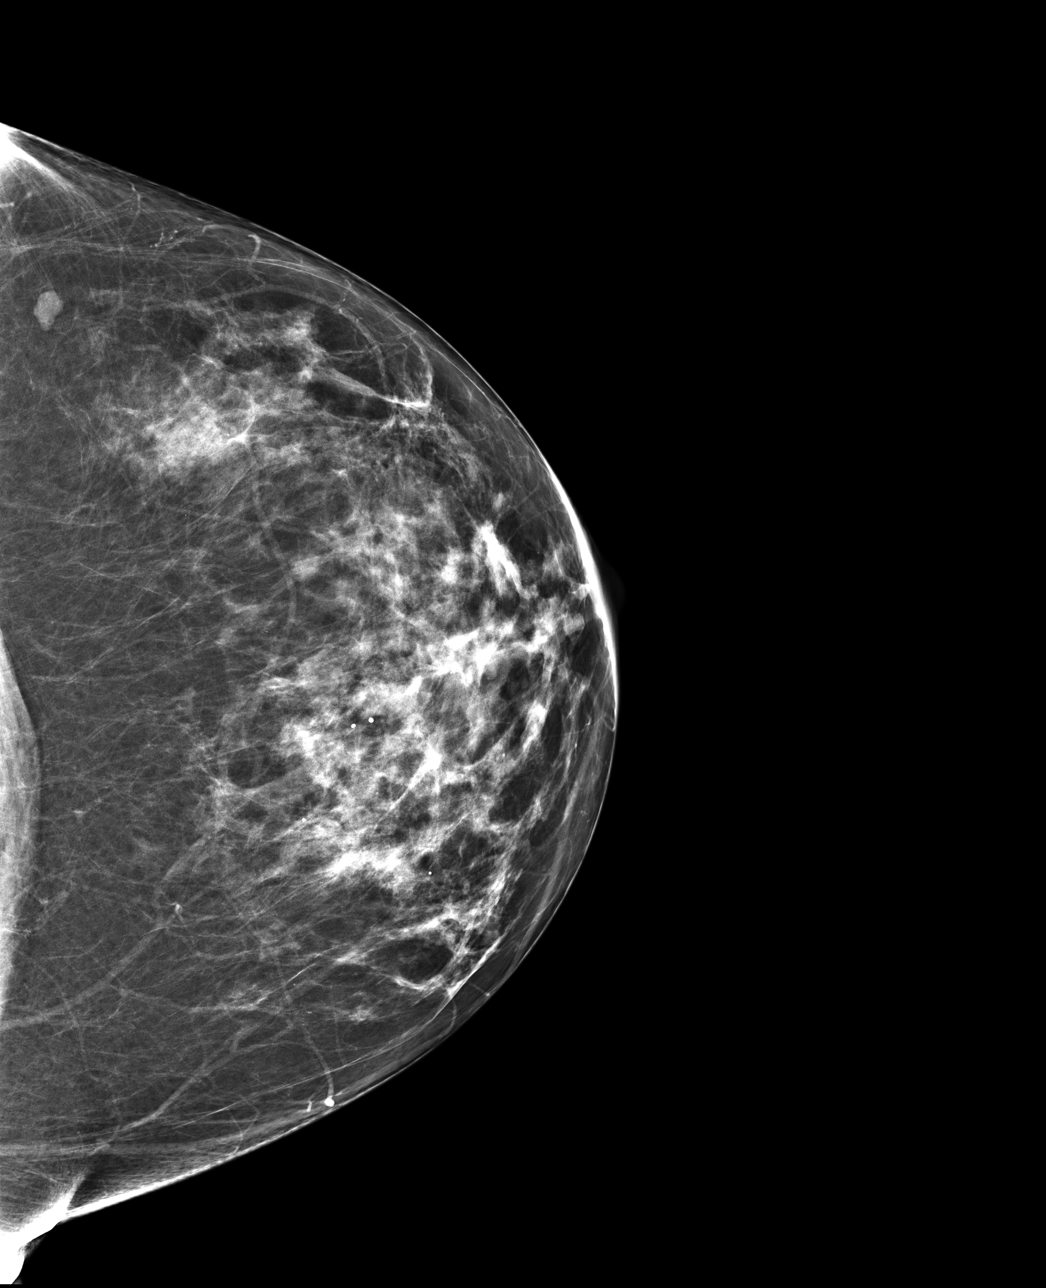

[4 of 4 positions shown; findings below may reference images not displayed]

ACR Breast Density Category c: The breast tissue is heterogeneously
dense, which may obscure small masses.
FINDINGS: There are no findings suspicious for malignancy. Images were
processed with CAD.
IMPRESSION: No mammographic evidence of malignancy. A result letter of this
screening mammogram will be mailed directly to the patient.

RECOMMENDATION:
Screening mammogram in one year. (Code:[id number])

BI-RADS CATEGORY  1: Negative.

## 2019-04-21 DIAGNOSIS — Z01818 Encounter for other preprocedural examination: Secondary | ICD-10-CM | POA: Diagnosis not present

## 2019-04-21 DIAGNOSIS — Z01810 Encounter for preprocedural cardiovascular examination: Secondary | ICD-10-CM | POA: Diagnosis not present

## 2019-04-21 DIAGNOSIS — M170 Bilateral primary osteoarthritis of knee: Secondary | ICD-10-CM | POA: Diagnosis not present

## 2019-04-21 DIAGNOSIS — Z01812 Encounter for preprocedural laboratory examination: Secondary | ICD-10-CM | POA: Diagnosis not present

## 2019-04-22 DIAGNOSIS — Z01812 Encounter for preprocedural laboratory examination: Secondary | ICD-10-CM | POA: Diagnosis not present

## 2019-04-22 DIAGNOSIS — Z20828 Contact with and (suspected) exposure to other viral communicable diseases: Secondary | ICD-10-CM | POA: Diagnosis not present

## 2019-04-22 DIAGNOSIS — M170 Bilateral primary osteoarthritis of knee: Secondary | ICD-10-CM | POA: Diagnosis not present

## 2019-04-28 DIAGNOSIS — K219 Gastro-esophageal reflux disease without esophagitis: Secondary | ICD-10-CM | POA: Diagnosis not present

## 2019-04-28 DIAGNOSIS — I10 Essential (primary) hypertension: Secondary | ICD-10-CM | POA: Diagnosis not present

## 2019-04-28 DIAGNOSIS — Z471 Aftercare following joint replacement surgery: Secondary | ICD-10-CM | POA: Diagnosis not present

## 2019-04-28 DIAGNOSIS — F329 Major depressive disorder, single episode, unspecified: Secondary | ICD-10-CM | POA: Diagnosis not present

## 2019-04-28 DIAGNOSIS — M1712 Unilateral primary osteoarthritis, left knee: Secondary | ICD-10-CM | POA: Diagnosis not present

## 2019-04-28 DIAGNOSIS — E785 Hyperlipidemia, unspecified: Secondary | ICD-10-CM | POA: Diagnosis not present

## 2019-04-28 DIAGNOSIS — Z96652 Presence of left artificial knee joint: Secondary | ICD-10-CM | POA: Diagnosis not present

## 2019-04-28 DIAGNOSIS — Z90710 Acquired absence of both cervix and uterus: Secondary | ICD-10-CM | POA: Diagnosis not present

## 2019-04-28 DIAGNOSIS — J45909 Unspecified asthma, uncomplicated: Secondary | ICD-10-CM | POA: Diagnosis not present

## 2019-04-28 DIAGNOSIS — G8918 Other acute postprocedural pain: Secondary | ICD-10-CM | POA: Diagnosis not present

## 2019-04-28 DIAGNOSIS — M170 Bilateral primary osteoarthritis of knee: Secondary | ICD-10-CM | POA: Diagnosis not present

## 2019-04-28 DIAGNOSIS — F411 Generalized anxiety disorder: Secondary | ICD-10-CM | POA: Diagnosis not present

## 2019-05-01 DIAGNOSIS — Z9181 History of falling: Secondary | ICD-10-CM | POA: Diagnosis not present

## 2019-05-01 DIAGNOSIS — Z471 Aftercare following joint replacement surgery: Secondary | ICD-10-CM | POA: Diagnosis not present

## 2019-05-01 DIAGNOSIS — Z7901 Long term (current) use of anticoagulants: Secondary | ICD-10-CM | POA: Diagnosis not present

## 2019-05-01 DIAGNOSIS — M1711 Unilateral primary osteoarthritis, right knee: Secondary | ICD-10-CM | POA: Diagnosis not present

## 2019-05-01 DIAGNOSIS — Z96652 Presence of left artificial knee joint: Secondary | ICD-10-CM | POA: Diagnosis not present

## 2019-05-04 DIAGNOSIS — M1711 Unilateral primary osteoarthritis, right knee: Secondary | ICD-10-CM | POA: Diagnosis not present

## 2019-05-04 DIAGNOSIS — Z96652 Presence of left artificial knee joint: Secondary | ICD-10-CM | POA: Diagnosis not present

## 2019-05-04 DIAGNOSIS — Z7901 Long term (current) use of anticoagulants: Secondary | ICD-10-CM | POA: Diagnosis not present

## 2019-05-04 DIAGNOSIS — Z471 Aftercare following joint replacement surgery: Secondary | ICD-10-CM | POA: Diagnosis not present

## 2019-05-04 DIAGNOSIS — Z9181 History of falling: Secondary | ICD-10-CM | POA: Diagnosis not present

## 2019-05-05 DIAGNOSIS — Z96652 Presence of left artificial knee joint: Secondary | ICD-10-CM | POA: Diagnosis not present

## 2019-05-05 DIAGNOSIS — Z471 Aftercare following joint replacement surgery: Secondary | ICD-10-CM | POA: Diagnosis not present

## 2019-05-05 DIAGNOSIS — Z9181 History of falling: Secondary | ICD-10-CM | POA: Diagnosis not present

## 2019-05-05 DIAGNOSIS — Z7901 Long term (current) use of anticoagulants: Secondary | ICD-10-CM | POA: Diagnosis not present

## 2019-05-05 DIAGNOSIS — M1711 Unilateral primary osteoarthritis, right knee: Secondary | ICD-10-CM | POA: Diagnosis not present

## 2019-05-07 DIAGNOSIS — Z471 Aftercare following joint replacement surgery: Secondary | ICD-10-CM | POA: Diagnosis not present

## 2019-05-07 DIAGNOSIS — Z7901 Long term (current) use of anticoagulants: Secondary | ICD-10-CM | POA: Diagnosis not present

## 2019-05-07 DIAGNOSIS — Z96652 Presence of left artificial knee joint: Secondary | ICD-10-CM | POA: Diagnosis not present

## 2019-05-07 DIAGNOSIS — M1711 Unilateral primary osteoarthritis, right knee: Secondary | ICD-10-CM | POA: Diagnosis not present

## 2019-05-07 DIAGNOSIS — Z9181 History of falling: Secondary | ICD-10-CM | POA: Diagnosis not present

## 2019-05-10 DIAGNOSIS — Z9181 History of falling: Secondary | ICD-10-CM | POA: Diagnosis not present

## 2019-05-10 DIAGNOSIS — Z96652 Presence of left artificial knee joint: Secondary | ICD-10-CM | POA: Diagnosis not present

## 2019-05-10 DIAGNOSIS — M1711 Unilateral primary osteoarthritis, right knee: Secondary | ICD-10-CM | POA: Diagnosis not present

## 2019-05-10 DIAGNOSIS — Z7901 Long term (current) use of anticoagulants: Secondary | ICD-10-CM | POA: Diagnosis not present

## 2019-05-10 DIAGNOSIS — Z471 Aftercare following joint replacement surgery: Secondary | ICD-10-CM | POA: Diagnosis not present

## 2019-05-12 DIAGNOSIS — Z471 Aftercare following joint replacement surgery: Secondary | ICD-10-CM | POA: Diagnosis not present

## 2019-05-12 DIAGNOSIS — Z96652 Presence of left artificial knee joint: Secondary | ICD-10-CM | POA: Diagnosis not present

## 2019-05-12 DIAGNOSIS — M1711 Unilateral primary osteoarthritis, right knee: Secondary | ICD-10-CM | POA: Diagnosis not present

## 2019-05-12 DIAGNOSIS — Z9181 History of falling: Secondary | ICD-10-CM | POA: Diagnosis not present

## 2019-05-12 DIAGNOSIS — Z7901 Long term (current) use of anticoagulants: Secondary | ICD-10-CM | POA: Diagnosis not present

## 2019-05-14 DIAGNOSIS — Z9181 History of falling: Secondary | ICD-10-CM | POA: Diagnosis not present

## 2019-05-14 DIAGNOSIS — M1711 Unilateral primary osteoarthritis, right knee: Secondary | ICD-10-CM | POA: Diagnosis not present

## 2019-05-14 DIAGNOSIS — Z471 Aftercare following joint replacement surgery: Secondary | ICD-10-CM | POA: Diagnosis not present

## 2019-05-14 DIAGNOSIS — Z96652 Presence of left artificial knee joint: Secondary | ICD-10-CM | POA: Diagnosis not present

## 2019-05-14 DIAGNOSIS — Z7901 Long term (current) use of anticoagulants: Secondary | ICD-10-CM | POA: Diagnosis not present

## 2019-05-17 DIAGNOSIS — Z471 Aftercare following joint replacement surgery: Secondary | ICD-10-CM | POA: Diagnosis not present

## 2019-05-17 DIAGNOSIS — Z9181 History of falling: Secondary | ICD-10-CM | POA: Diagnosis not present

## 2019-05-17 DIAGNOSIS — Z96652 Presence of left artificial knee joint: Secondary | ICD-10-CM | POA: Diagnosis not present

## 2019-05-17 DIAGNOSIS — Z7901 Long term (current) use of anticoagulants: Secondary | ICD-10-CM | POA: Diagnosis not present

## 2019-05-17 DIAGNOSIS — M1711 Unilateral primary osteoarthritis, right knee: Secondary | ICD-10-CM | POA: Diagnosis not present

## 2019-05-19 DIAGNOSIS — Z96652 Presence of left artificial knee joint: Secondary | ICD-10-CM | POA: Diagnosis not present

## 2019-05-19 DIAGNOSIS — Z9181 History of falling: Secondary | ICD-10-CM | POA: Diagnosis not present

## 2019-05-19 DIAGNOSIS — M1711 Unilateral primary osteoarthritis, right knee: Secondary | ICD-10-CM | POA: Diagnosis not present

## 2019-05-19 DIAGNOSIS — Z7901 Long term (current) use of anticoagulants: Secondary | ICD-10-CM | POA: Diagnosis not present

## 2019-05-19 DIAGNOSIS — Z471 Aftercare following joint replacement surgery: Secondary | ICD-10-CM | POA: Diagnosis not present

## 2019-05-21 DIAGNOSIS — Z9181 History of falling: Secondary | ICD-10-CM | POA: Diagnosis not present

## 2019-05-21 DIAGNOSIS — Z7901 Long term (current) use of anticoagulants: Secondary | ICD-10-CM | POA: Diagnosis not present

## 2019-05-21 DIAGNOSIS — Z96652 Presence of left artificial knee joint: Secondary | ICD-10-CM | POA: Diagnosis not present

## 2019-05-21 DIAGNOSIS — M1711 Unilateral primary osteoarthritis, right knee: Secondary | ICD-10-CM | POA: Diagnosis not present

## 2019-05-21 DIAGNOSIS — Z471 Aftercare following joint replacement surgery: Secondary | ICD-10-CM | POA: Diagnosis not present

## 2019-05-24 DIAGNOSIS — Z9181 History of falling: Secondary | ICD-10-CM | POA: Diagnosis not present

## 2019-05-24 DIAGNOSIS — Z471 Aftercare following joint replacement surgery: Secondary | ICD-10-CM | POA: Diagnosis not present

## 2019-05-24 DIAGNOSIS — Z7901 Long term (current) use of anticoagulants: Secondary | ICD-10-CM | POA: Diagnosis not present

## 2019-05-24 DIAGNOSIS — M1711 Unilateral primary osteoarthritis, right knee: Secondary | ICD-10-CM | POA: Diagnosis not present

## 2019-05-24 DIAGNOSIS — Z96652 Presence of left artificial knee joint: Secondary | ICD-10-CM | POA: Diagnosis not present

## 2019-05-26 DIAGNOSIS — Z7901 Long term (current) use of anticoagulants: Secondary | ICD-10-CM | POA: Diagnosis not present

## 2019-05-26 DIAGNOSIS — Z9181 History of falling: Secondary | ICD-10-CM | POA: Diagnosis not present

## 2019-05-26 DIAGNOSIS — Z96652 Presence of left artificial knee joint: Secondary | ICD-10-CM | POA: Diagnosis not present

## 2019-05-26 DIAGNOSIS — M1711 Unilateral primary osteoarthritis, right knee: Secondary | ICD-10-CM | POA: Diagnosis not present

## 2019-05-26 DIAGNOSIS — Z471 Aftercare following joint replacement surgery: Secondary | ICD-10-CM | POA: Diagnosis not present

## 2019-05-28 DIAGNOSIS — Z9181 History of falling: Secondary | ICD-10-CM | POA: Diagnosis not present

## 2019-05-28 DIAGNOSIS — Z96652 Presence of left artificial knee joint: Secondary | ICD-10-CM | POA: Diagnosis not present

## 2019-05-28 DIAGNOSIS — Z7901 Long term (current) use of anticoagulants: Secondary | ICD-10-CM | POA: Diagnosis not present

## 2019-05-28 DIAGNOSIS — Z471 Aftercare following joint replacement surgery: Secondary | ICD-10-CM | POA: Diagnosis not present

## 2019-05-28 DIAGNOSIS — M1711 Unilateral primary osteoarthritis, right knee: Secondary | ICD-10-CM | POA: Diagnosis not present

## 2019-06-02 DIAGNOSIS — R262 Difficulty in walking, not elsewhere classified: Secondary | ICD-10-CM | POA: Diagnosis not present

## 2019-06-02 DIAGNOSIS — R29898 Other symptoms and signs involving the musculoskeletal system: Secondary | ICD-10-CM | POA: Diagnosis not present

## 2019-06-02 DIAGNOSIS — M25662 Stiffness of left knee, not elsewhere classified: Secondary | ICD-10-CM | POA: Diagnosis not present

## 2019-06-02 DIAGNOSIS — M25562 Pain in left knee: Secondary | ICD-10-CM | POA: Diagnosis not present

## 2019-06-04 DIAGNOSIS — M25662 Stiffness of left knee, not elsewhere classified: Secondary | ICD-10-CM | POA: Diagnosis not present

## 2019-06-04 DIAGNOSIS — M25562 Pain in left knee: Secondary | ICD-10-CM | POA: Diagnosis not present

## 2019-06-04 DIAGNOSIS — R262 Difficulty in walking, not elsewhere classified: Secondary | ICD-10-CM | POA: Diagnosis not present

## 2019-06-04 DIAGNOSIS — Z96652 Presence of left artificial knee joint: Secondary | ICD-10-CM | POA: Diagnosis not present

## 2019-06-07 DIAGNOSIS — R262 Difficulty in walking, not elsewhere classified: Secondary | ICD-10-CM | POA: Diagnosis not present

## 2019-06-07 DIAGNOSIS — M25662 Stiffness of left knee, not elsewhere classified: Secondary | ICD-10-CM | POA: Diagnosis not present

## 2019-06-07 DIAGNOSIS — R29898 Other symptoms and signs involving the musculoskeletal system: Secondary | ICD-10-CM | POA: Diagnosis not present

## 2019-06-07 DIAGNOSIS — M25562 Pain in left knee: Secondary | ICD-10-CM | POA: Diagnosis not present

## 2019-06-09 DIAGNOSIS — M25662 Stiffness of left knee, not elsewhere classified: Secondary | ICD-10-CM | POA: Diagnosis not present

## 2019-06-09 DIAGNOSIS — M25562 Pain in left knee: Secondary | ICD-10-CM | POA: Diagnosis not present

## 2019-06-09 DIAGNOSIS — R262 Difficulty in walking, not elsewhere classified: Secondary | ICD-10-CM | POA: Diagnosis not present

## 2019-06-09 DIAGNOSIS — R29898 Other symptoms and signs involving the musculoskeletal system: Secondary | ICD-10-CM | POA: Diagnosis not present

## 2019-06-11 DIAGNOSIS — Z471 Aftercare following joint replacement surgery: Secondary | ICD-10-CM | POA: Diagnosis not present

## 2019-06-11 DIAGNOSIS — Z96652 Presence of left artificial knee joint: Secondary | ICD-10-CM | POA: Diagnosis not present

## 2019-06-14 DIAGNOSIS — M7531 Calcific tendinitis of right shoulder: Secondary | ICD-10-CM | POA: Insufficient documentation

## 2019-06-14 DIAGNOSIS — M25511 Pain in right shoulder: Secondary | ICD-10-CM | POA: Diagnosis not present

## 2019-06-15 DIAGNOSIS — M25662 Stiffness of left knee, not elsewhere classified: Secondary | ICD-10-CM | POA: Diagnosis not present

## 2019-06-15 DIAGNOSIS — M25562 Pain in left knee: Secondary | ICD-10-CM | POA: Diagnosis not present

## 2019-06-15 DIAGNOSIS — R262 Difficulty in walking, not elsewhere classified: Secondary | ICD-10-CM | POA: Diagnosis not present

## 2019-06-15 DIAGNOSIS — Z96652 Presence of left artificial knee joint: Secondary | ICD-10-CM | POA: Diagnosis not present

## 2019-06-18 DIAGNOSIS — R29898 Other symptoms and signs involving the musculoskeletal system: Secondary | ICD-10-CM | POA: Diagnosis not present

## 2019-06-18 DIAGNOSIS — R262 Difficulty in walking, not elsewhere classified: Secondary | ICD-10-CM | POA: Diagnosis not present

## 2019-06-18 DIAGNOSIS — M25662 Stiffness of left knee, not elsewhere classified: Secondary | ICD-10-CM | POA: Diagnosis not present

## 2019-06-18 DIAGNOSIS — M25562 Pain in left knee: Secondary | ICD-10-CM | POA: Diagnosis not present

## 2019-06-21 DIAGNOSIS — M25662 Stiffness of left knee, not elsewhere classified: Secondary | ICD-10-CM | POA: Diagnosis not present

## 2019-06-21 DIAGNOSIS — M25562 Pain in left knee: Secondary | ICD-10-CM | POA: Diagnosis not present

## 2019-06-21 DIAGNOSIS — R29898 Other symptoms and signs involving the musculoskeletal system: Secondary | ICD-10-CM | POA: Diagnosis not present

## 2019-06-21 DIAGNOSIS — R262 Difficulty in walking, not elsewhere classified: Secondary | ICD-10-CM | POA: Diagnosis not present

## 2019-06-28 DIAGNOSIS — M25562 Pain in left knee: Secondary | ICD-10-CM | POA: Diagnosis not present

## 2019-06-28 DIAGNOSIS — R262 Difficulty in walking, not elsewhere classified: Secondary | ICD-10-CM | POA: Diagnosis not present

## 2019-06-28 DIAGNOSIS — R29898 Other symptoms and signs involving the musculoskeletal system: Secondary | ICD-10-CM | POA: Diagnosis not present

## 2019-06-28 DIAGNOSIS — M25662 Stiffness of left knee, not elsewhere classified: Secondary | ICD-10-CM | POA: Diagnosis not present

## 2019-06-30 ENCOUNTER — Other Ambulatory Visit: Payer: Self-pay | Admitting: Obstetrics & Gynecology

## 2019-06-30 DIAGNOSIS — Z01419 Encounter for gynecological examination (general) (routine) without abnormal findings: Secondary | ICD-10-CM

## 2019-06-30 MED ORDER — VENLAFAXINE HCL ER 37.5 MG PO CP24: ORAL_CAPSULE | ORAL | 0 refills | Status: DC

## 2019-06-30 NOTE — Telephone Encounter (Signed)
Spoke with pharmacy staff at [facility] on file, Tracy Landry. She said needs new Rx saying pt can use up to 2 capsules a day if needed so insurance will cover it. Pt is now out of medication.   Will route to Dr Sabra Heck for [facility] and Dr Talbert Nan for review and approval of med RF.  Med refill request: Effexor XR Last AEX: 09/18/2017 Next AEX: 01/28/2020 Last MMG (if hormonal med) 09/05/2017 Refill authorized: #[id number] with new instructions for up to 2 capsules as needed daily. Orders pended if approved.

## 2019-06-30 NOTE — Telephone Encounter (Signed)
Call placed to pt. Pt updated on med RF. Pt agreeable to plan to call back when needing more RF from Dr Sabra Heck.   Will route to Dr Talbert Nan for review and will close encounter.

## 2019-06-30 NOTE — Telephone Encounter (Signed)
Patient's wife sent prescription request for

## 2019-06-30 NOTE — Telephone Encounter (Signed)
I'm not accustom to this dosing. Will do a short term refill, and send to Dr Sabra Heck for further refills. Please let the patient know.  CC: Dr Sabra Heck

## 2019-06-30 NOTE — Telephone Encounter (Signed)
Spoke to pt. Pt states needing RF on Effexor XR due to taking 2 a day since end of October d/t knee surgery. Pt states given ok by Dr Sabra Heck to take 2 capsules since knee surgery. Pt states cant pick up refill until 07/22/19. Given 90 capsules per Rx.   Med refill request: Effexor XR Last AEX: 09/18/2017 Next AEX:01/28/2020 Last MMG (if hormonal med) 09/05/2017 Refill authorized: Please advise???  Will call pharmacy to see if can override RF now instead of scheduled pick up on 07/22/19.

## 2019-07-05 DIAGNOSIS — M25662 Stiffness of left knee, not elsewhere classified: Secondary | ICD-10-CM | POA: Diagnosis not present

## 2019-07-05 DIAGNOSIS — R29898 Other symptoms and signs involving the musculoskeletal system: Secondary | ICD-10-CM | POA: Diagnosis not present

## 2019-07-05 DIAGNOSIS — R262 Difficulty in walking, not elsewhere classified: Secondary | ICD-10-CM | POA: Diagnosis not present

## 2019-07-05 DIAGNOSIS — M25562 Pain in left knee: Secondary | ICD-10-CM | POA: Diagnosis not present

## 2019-07-19 DIAGNOSIS — R262 Difficulty in walking, not elsewhere classified: Secondary | ICD-10-CM | POA: Diagnosis not present

## 2019-07-19 DIAGNOSIS — M25562 Pain in left knee: Secondary | ICD-10-CM | POA: Diagnosis not present

## 2019-07-19 DIAGNOSIS — M25662 Stiffness of left knee, not elsewhere classified: Secondary | ICD-10-CM | POA: Diagnosis not present

## 2019-07-19 DIAGNOSIS — R29898 Other symptoms and signs involving the musculoskeletal system: Secondary | ICD-10-CM | POA: Diagnosis not present

## 2019-07-23 DIAGNOSIS — M25562 Pain in left knee: Secondary | ICD-10-CM | POA: Diagnosis not present

## 2019-07-23 DIAGNOSIS — Z96652 Presence of left artificial knee joint: Secondary | ICD-10-CM | POA: Diagnosis not present

## 2019-07-27 DIAGNOSIS — Z96652 Presence of left artificial knee joint: Secondary | ICD-10-CM | POA: Diagnosis not present

## 2019-07-27 DIAGNOSIS — S82002A Unspecified fracture of left patella, initial encounter for closed fracture: Secondary | ICD-10-CM | POA: Diagnosis not present

## 2019-08-05 ENCOUNTER — Other Ambulatory Visit: Payer: Self-pay | Admitting: Obstetrics and Gynecology

## 2019-08-05 ENCOUNTER — Encounter: Payer: Self-pay | Admitting: Obstetrics & Gynecology

## 2019-08-05 DIAGNOSIS — Z01419 Encounter for gynecological examination (general) (routine) without abnormal findings: Secondary | ICD-10-CM

## 2019-08-05 NOTE — Telephone Encounter (Signed)
Medication refill request: effexor-XR Last AEX:  09/08/17 Next AEX: 01/28/20 Last MMG (if hormonal medication request): NA Refill authorized: #[id number] with 1 RF

## 2019-08-11 DIAGNOSIS — S82042A Displaced comminuted fracture of left patella, initial encounter for closed fracture: Secondary | ICD-10-CM | POA: Diagnosis not present

## 2019-08-11 DIAGNOSIS — X58XXXA Exposure to other specified factors, initial encounter: Secondary | ICD-10-CM | POA: Diagnosis not present

## 2019-08-11 DIAGNOSIS — S82002A Unspecified fracture of left patella, initial encounter for closed fracture: Secondary | ICD-10-CM | POA: Diagnosis not present

## 2019-08-11 DIAGNOSIS — G8918 Other acute postprocedural pain: Secondary | ICD-10-CM | POA: Diagnosis not present

## 2019-08-11 DIAGNOSIS — Y999 Unspecified external cause status: Secondary | ICD-10-CM | POA: Diagnosis not present

## 2019-08-17 DIAGNOSIS — M25662 Stiffness of left knee, not elsewhere classified: Secondary | ICD-10-CM | POA: Diagnosis not present

## 2019-08-17 DIAGNOSIS — R262 Difficulty in walking, not elsewhere classified: Secondary | ICD-10-CM | POA: Diagnosis not present

## 2019-08-17 DIAGNOSIS — R29898 Other symptoms and signs involving the musculoskeletal system: Secondary | ICD-10-CM | POA: Diagnosis not present

## 2019-08-17 DIAGNOSIS — M25562 Pain in left knee: Secondary | ICD-10-CM | POA: Diagnosis not present

## 2019-08-19 DIAGNOSIS — R262 Difficulty in walking, not elsewhere classified: Secondary | ICD-10-CM | POA: Diagnosis not present

## 2019-08-19 DIAGNOSIS — R29898 Other symptoms and signs involving the musculoskeletal system: Secondary | ICD-10-CM | POA: Diagnosis not present

## 2019-08-19 DIAGNOSIS — M25562 Pain in left knee: Secondary | ICD-10-CM | POA: Diagnosis not present

## 2019-08-19 DIAGNOSIS — M25662 Stiffness of left knee, not elsewhere classified: Secondary | ICD-10-CM | POA: Diagnosis not present

## 2019-08-24 DIAGNOSIS — M25662 Stiffness of left knee, not elsewhere classified: Secondary | ICD-10-CM | POA: Diagnosis not present

## 2019-08-24 DIAGNOSIS — M25562 Pain in left knee: Secondary | ICD-10-CM | POA: Diagnosis not present

## 2019-08-24 DIAGNOSIS — Z7409 Other reduced mobility: Secondary | ICD-10-CM | POA: Diagnosis not present

## 2019-08-24 DIAGNOSIS — R29898 Other symptoms and signs involving the musculoskeletal system: Secondary | ICD-10-CM | POA: Diagnosis not present

## 2019-08-27 DIAGNOSIS — M25662 Stiffness of left knee, not elsewhere classified: Secondary | ICD-10-CM | POA: Diagnosis not present

## 2019-08-27 DIAGNOSIS — M25562 Pain in left knee: Secondary | ICD-10-CM | POA: Diagnosis not present

## 2019-08-27 DIAGNOSIS — R29898 Other symptoms and signs involving the musculoskeletal system: Secondary | ICD-10-CM | POA: Diagnosis not present

## 2019-08-27 DIAGNOSIS — Z7409 Other reduced mobility: Secondary | ICD-10-CM | POA: Diagnosis not present

## 2019-09-03 DIAGNOSIS — R262 Difficulty in walking, not elsewhere classified: Secondary | ICD-10-CM | POA: Diagnosis not present

## 2019-09-03 DIAGNOSIS — M25662 Stiffness of left knee, not elsewhere classified: Secondary | ICD-10-CM | POA: Diagnosis not present

## 2019-09-03 DIAGNOSIS — M25562 Pain in left knee: Secondary | ICD-10-CM | POA: Diagnosis not present

## 2019-09-03 DIAGNOSIS — R29898 Other symptoms and signs involving the musculoskeletal system: Secondary | ICD-10-CM | POA: Diagnosis not present

## 2019-10-01 DIAGNOSIS — Z7409 Other reduced mobility: Secondary | ICD-10-CM | POA: Diagnosis not present

## 2019-10-01 DIAGNOSIS — M25562 Pain in left knee: Secondary | ICD-10-CM | POA: Diagnosis not present

## 2019-10-01 DIAGNOSIS — M25662 Stiffness of left knee, not elsewhere classified: Secondary | ICD-10-CM | POA: Diagnosis not present

## 2019-10-01 DIAGNOSIS — R29898 Other symptoms and signs involving the musculoskeletal system: Secondary | ICD-10-CM | POA: Diagnosis not present

## 2019-10-04 DIAGNOSIS — K219 Gastro-esophageal reflux disease without esophagitis: Secondary | ICD-10-CM | POA: Diagnosis not present

## 2019-10-04 DIAGNOSIS — F341 Dysthymic disorder: Secondary | ICD-10-CM | POA: Diagnosis not present

## 2019-10-04 DIAGNOSIS — E783 Hyperchylomicronemia: Secondary | ICD-10-CM | POA: Diagnosis not present

## 2019-10-04 DIAGNOSIS — R7301 Impaired fasting glucose: Secondary | ICD-10-CM | POA: Diagnosis not present

## 2019-10-06 DIAGNOSIS — R7301 Impaired fasting glucose: Secondary | ICD-10-CM | POA: Diagnosis not present

## 2019-10-06 DIAGNOSIS — E783 Hyperchylomicronemia: Secondary | ICD-10-CM | POA: Diagnosis not present

## 2019-10-06 DIAGNOSIS — D649 Anemia, unspecified: Secondary | ICD-10-CM | POA: Diagnosis not present

## 2019-11-08 ENCOUNTER — Other Ambulatory Visit: Payer: Self-pay | Admitting: Obstetrics & Gynecology

## 2019-11-08 DIAGNOSIS — Z1231 Encounter for screening mammogram for malignant neoplasm of breast: Secondary | ICD-10-CM

## 2019-11-11 ENCOUNTER — Ambulatory Visit (INDEPENDENT_AMBULATORY_CARE_PROVIDER_SITE_OTHER): Payer: BC Managed Care – PPO

## 2019-11-11 ENCOUNTER — Other Ambulatory Visit: Payer: Self-pay

## 2019-11-11 DIAGNOSIS — Z1231 Encounter for screening mammogram for malignant neoplasm of breast: Secondary | ICD-10-CM

## 2019-11-30 ENCOUNTER — Ambulatory Visit (INDEPENDENT_AMBULATORY_CARE_PROVIDER_SITE_OTHER): Payer: BC Managed Care – PPO | Admitting: Sports Medicine

## 2019-11-30 ENCOUNTER — Other Ambulatory Visit: Payer: Self-pay

## 2019-11-30 ENCOUNTER — Encounter: Payer: Self-pay | Admitting: Sports Medicine

## 2019-11-30 ENCOUNTER — Ambulatory Visit (INDEPENDENT_AMBULATORY_CARE_PROVIDER_SITE_OTHER): Payer: BC Managed Care – PPO

## 2019-11-30 DIAGNOSIS — M25461 Effusion, right knee: Secondary | ICD-10-CM | POA: Diagnosis not present

## 2019-11-30 DIAGNOSIS — M1711 Unilateral primary osteoarthritis, right knee: Secondary | ICD-10-CM | POA: Diagnosis not present

## 2019-11-30 DIAGNOSIS — M25561 Pain in right knee: Secondary | ICD-10-CM | POA: Diagnosis not present

## 2019-11-30 DIAGNOSIS — M25562 Pain in left knee: Secondary | ICD-10-CM

## 2019-11-30 DIAGNOSIS — S82002A Unspecified fracture of left patella, initial encounter for closed fracture: Secondary | ICD-10-CM | POA: Diagnosis not present

## 2019-11-30 MED ORDER — HYDROCODONE-ACETAMINOPHEN 10-325 MG PO TABS: 1.0000 | ORAL_TABLET | Freq: Three times a day (TID) | ORAL | 0 refills | Status: DC | PRN

## 2019-11-30 NOTE — Addendum Note (Signed)
Addended by: Silverio Decamp on: 11/30/2019 02:44 PM   Modules accepted: Orders

## 2019-11-30 NOTE — Progress Notes (Addendum)
    Procedures performed today:    Procedure: Real-time Ultrasound Guided aspiration/injection of right knee Device: Samsung HS60  Verbal informed consent obtained.  Time-out conducted.  Noted no overlying erythema, induration, or other signs of local infection.  Skin prepped in a sterile fashion.  Local anesthesia: Topical Ethyl chloride.  With sterile technique and under real time ultrasound guidance:  I aspirated 30 mL of clear, yellow fluid, syringe switched and 1 cc Kenalog 40, 2 cc lidocaine, 2 cc bupivacaine injected easily Completed without difficulty  Pain immediately resolved suggesting accurate placement of the medication.  Advised to call if fevers/chills, erythema, induration, drainage, or persistent bleeding.  Images permanently stored and available for review in the ultrasound unit.  Impression: Technically successful ultrasound guided injection.  Procedure: Real-time Ultrasound Guided aspiration/injection of left knee status post arthroplasty Device: Samsung HS60  Verbal informed consent obtained.  Time-out conducted.  Noted no overlying erythema, induration, or other signs of local infection.  Skin prepped in a sterile fashion.  Local anesthesia: Topical Ethyl chloride.  With sterile technique and under real time ultrasound guidance:  Using 18-gauge needle I aspirated approximately 15 cc of slightly cloudy, yellowish fluid, syringe switched and 9 cc lidocaine, 1 cc Kenalog 40 injected easily.  Completed without difficulty  Pain immediately resolved suggesting accurate placement of the medication.  Advised to call if fevers/chills, erythema, induration, drainage, or persistent bleeding.  Images permanently stored and available for review in the ultrasound unit.  Impression: Technically successful ultrasound guided injection.  Independent interpretation of notes and tests performed by another provider:   Vertical fracture with diastases of the fracture fragments  through the patella.  Brief History, Exam, Impression, and Recommendations:    Primary osteoarthritis of right knee, status post left total knee arthroplasty Addasyn returns, she is having increasing pain in her right knee. She did have an arthroplasty on the left followed by open synovectomy and debridement of patellar avulsion fracture. It sounds as though her patellar button was well fixed and did not need revision. Aspiration and injection of the right knee. Her left knee is hurting significantly, in a patellofemoral fashion. We did an aspiration and lidocaine injection with a bit of steroid, and all of her pain resolves suggesting an intra-articular pain generator. I did add some hydrocodone, updated x-rays. We we will send the aspirate off for crystal analysis, cultures, and Synovasure evaluation. Return to see me in a month.  There is a vertical fracture through the patella, the fracture fragments have pulled apart to some degree, this is the likely cause of the pain.  I would like a CT scan to further evaluate if there is a fibrous union, but this most certainly needs repeat surgery, she really needs to contact her orthopedic surgeon to discuss repair.    ___________________________________________ Gwen Her. Dianah Field, M.D., ABFM., CAQSM. Primary Care and [place] Instructor of [place] of [facility] of [employer]

## 2019-11-30 NOTE — Assessment & Plan Note (Addendum)
Tracy Landry returns, she is having increasing pain in her right knee. She did have an arthroplasty on the left followed by open synovectomy and debridement of patellar avulsion fracture. It sounds as though her patellar button was well fixed and did not need revision. Aspiration and injection of the right knee. Her left knee is hurting significantly, in a patellofemoral fashion. We did an aspiration and lidocaine injection with a bit of steroid, and all of her pain resolves suggesting an intra-articular pain generator. I did add some hydrocodone, updated x-rays. We we will send the aspirate off for crystal analysis, cultures, and Synovasure evaluation. Return to see me in a month.  There is a vertical fracture through the patella, the fracture fragments have pulled apart to some degree, this is the likely cause of the pain.  I would like a CT scan to further evaluate if there is a fibrous union, but this most certainly needs repeat surgery, she really needs to contact her orthopedic surgeon to discuss repair.

## 2019-12-01 ENCOUNTER — Ambulatory Visit (INDEPENDENT_AMBULATORY_CARE_PROVIDER_SITE_OTHER): Payer: BC Managed Care – PPO

## 2019-12-01 DIAGNOSIS — M1711 Unilateral primary osteoarthritis, right knee: Secondary | ICD-10-CM

## 2019-12-01 DIAGNOSIS — S82092K Other fracture of left patella, subsequent encounter for closed fracture with nonunion: Secondary | ICD-10-CM | POA: Diagnosis not present

## 2019-12-01 LAB — CELL COUNT AND DIFF, FLUID, OTHER
Basophils, %: 0 %
Eosinophils, %: 0 %
Lymphocytes, %: 86 %
Mesothelial, %: 0 %
Monocyte/Macrophage %: 12 %
Neutrophils, %: 2 %
Total Nucleated Cell Ct: 580 cells/uL

## 2019-12-01 LAB — SYNOVIAL FLUID, CRYSTAL

## 2019-12-01 NOTE — Addendum Note (Signed)
Addended by: Silverio Decamp on: 12/01/2019 09:05 AM   Modules accepted: Orders

## 2019-12-02 DIAGNOSIS — S82022A Displaced longitudinal fracture of left patella, initial encounter for closed fracture: Secondary | ICD-10-CM | POA: Diagnosis not present

## 2019-12-09 DIAGNOSIS — Z96652 Presence of left artificial knee joint: Secondary | ICD-10-CM | POA: Diagnosis not present

## 2019-12-09 DIAGNOSIS — M25562 Pain in left knee: Secondary | ICD-10-CM | POA: Diagnosis not present

## 2019-12-13 DIAGNOSIS — Z01812 Encounter for preprocedural laboratory examination: Secondary | ICD-10-CM | POA: Diagnosis not present

## 2019-12-24 DIAGNOSIS — M9712XA Periprosthetic fracture around internal prosthetic left knee joint, initial encounter: Secondary | ICD-10-CM | POA: Diagnosis not present

## 2019-12-24 DIAGNOSIS — S82042A Displaced comminuted fracture of left patella, initial encounter for closed fracture: Secondary | ICD-10-CM | POA: Diagnosis not present

## 2019-12-28 ENCOUNTER — Ambulatory Visit: Payer: BC Managed Care – PPO | Admitting: Sports Medicine

## 2019-12-31 LAB — ANAEROBIC AND AEROBIC CULTURE
AER RESULT:: NO GROWTH
GRAM STAIN:: NONE SEEN
MICRO NUMBER:: 10412016
MICRO NUMBER:: 10412017
SPECIMEN QUALITY:: ADEQUATE
SPECIMEN QUALITY:: ADEQUATE

## 2020-01-04 DIAGNOSIS — M9712XA Periprosthetic fracture around internal prosthetic left knee joint, initial encounter: Secondary | ICD-10-CM | POA: Diagnosis not present

## 2020-01-17 ENCOUNTER — Other Ambulatory Visit: Payer: Self-pay

## 2020-01-17 ENCOUNTER — Telehealth: Payer: Self-pay | Admitting: Physician Assistant

## 2020-01-17 ENCOUNTER — Emergency Department
Admission: RE | Admit: 2020-01-17 | Discharge: 2020-01-17 | Disposition: A | Discharge status: Home / Self Care | Payer: BC Managed Care – PPO | Source: Ambulatory Visit

## 2020-01-17 ENCOUNTER — Emergency Department (INDEPENDENT_AMBULATORY_CARE_PROVIDER_SITE_OTHER): Payer: BC Managed Care – PPO

## 2020-01-17 VITALS — BP 169/98 | HR 100 | Temp 99.1°F | Resp 18

## 2020-01-17 DIAGNOSIS — R0989 Other specified symptoms and signs involving the circulatory and respiratory systems: Secondary | ICD-10-CM | POA: Diagnosis not present

## 2020-01-17 DIAGNOSIS — Z8709 Personal history of other diseases of the respiratory system: Secondary | ICD-10-CM

## 2020-01-17 DIAGNOSIS — J069 Acute upper respiratory infection, unspecified: Secondary | ICD-10-CM

## 2020-01-17 DIAGNOSIS — R05 Cough: Secondary | ICD-10-CM | POA: Diagnosis not present

## 2020-01-17 DIAGNOSIS — J9801 Acute bronchospasm: Secondary | ICD-10-CM

## 2020-01-17 DIAGNOSIS — R509 Fever, unspecified: Secondary | ICD-10-CM | POA: Diagnosis not present

## 2020-01-17 MED ORDER — PROMETHAZINE-DM 6.25-15 MG/5ML PO SYRP: 5.0000 mL | ORAL_SOLUTION | Freq: Four times a day (QID) | ORAL | 0 refills | Status: DC | PRN

## 2020-01-17 MED ORDER — DEXAMETHASONE SODIUM PHOSPHATE 10 MG/ML IJ SOLN
10.0000 mg | Freq: Once | INTRAMUSCULAR | Status: AC
  Administered 2020-01-17: 10 mg via INTRAMUSCULAR

## 2020-01-17 MED ORDER — PREDNISONE 50 MG PO TABS: 50.0000 mg | ORAL_TABLET | Freq: Every day | ORAL | 0 refills | Status: DC

## 2020-01-17 NOTE — Telephone Encounter (Signed)
Pt wife called stating [name] had an upper respiratory infection and a fever of 101. I tried to inform Tracy Landry that this has to be a virtual appt. Tracy Landry was very upset and said she'll take her to urgent care and hung up.

## 2020-01-17 NOTE — Telephone Encounter (Signed)
Thanks for up holding protocol.

## 2020-01-17 NOTE — ED Triage Notes (Signed)
Patient presents to Urgent Care with complaints of cough and fever since three days ago. Patient reports she has been using her inhaler every 4-6 hours, tylenol for the fever, neither has helped. Cough is dry.

## 2020-01-17 NOTE — Discharge Instructions (Addendum)
  You were given a shot of decadron (a steroid) today to help with inflammation in your lungs to help you breath better and to help with your cough.  You have been prescribed 5 days of prednisone, an oral steroid.  You may start this medication tomorrow with breakfast.    You may take 500mg  acetaminophen every 4-6 hours or in combination with ibuprofen 400-600mg  every 6-8 hours as needed for pain, inflammation, and fever.  Be sure to well hydrated with clear liquids and get at least 8 hours of sleep at night, preferably more while sick.   Please follow up with family medicine in 1 week if needed.

## 2020-01-17 NOTE — ED Provider Notes (Signed)
[facility]    CSN: [id number] Arrival date & time: 01/17/20  1102      History   Chief Complaint Chief Complaint  Patient presents with  . Appointment    1100  . Cough  . Fever    HPI Tracy Landry is a 59 y.o. female.   HPI Tracy Landry is a 59 y.o. female presenting to UC with c/o 3 days of worsening dry hacking cough, congestion, chest tightness and fever Tmax 101*F this morning.  She has taken Tylenol and used her inhaler with minimal relief.  No sick contacts or recent travel. She has received her Covid vaccine.   Past Medical History:  Diagnosis Date  . Asthma   . Back pain   . Depression   . GERD (gastroesophageal reflux disease)   . Hypertension   . 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  . Wears glasses     Patient Active Problem List   Diagnosis Date Noted  . Primary osteoarthritis of first carpometacarpal joint of right hand 03/26/2018  . Periscapular pain 06/03/2016  . Primary osteoarthritis of right knee, status post left total knee arthroplasty 05/08/2016  . Low back pain 02/22/2016  . Anxiety state 03/28/2015  . Plantar fasciitis, left 06/09/2014  . Essential hypertension 07/15/2012  . SVT (supraventricular tachycardia) ([place]) 04/15/2012  . Fatigue 04/15/2012  . Depression 03/15/2011  . COUGH 05/15/2010  . ANKYLOSING SPONDYLITIS 01/06/2007  . HYPERLIPIDEMIA 08/22/2006  . ASTHMA, PERSISTENT, MILD 08/12/2006  . OBESITY NOS 06/01/2006  . GASTROESOPHAGEAL REFLUX, NO ESOPHAGITIS 05/13/2006  . OSTEOPENIA 05/13/2006    Past Surgical History:  Procedure Laterality Date  . ABDOMINAL HYSTERECTOMY  3/09  . ABLATION OF DYSRHYTHMIC FOCUS  10/07/2012   SVT  . APPENDECTOMY    . FOOT SURGERY  2000   rt foot fx  . HARDWARE REMOVAL  2003   rt arm  . MASS EXCISION Left 07/07/2013   Procedure: EXCISION MASS LEFT RING FINGER;  Surgeon: Wynonia Sours, MD;  Location: [facility];  Service:  Orthopedics;  Laterality: Left;  . REPAIR EXTENSOR TENDON Left 07/07/2013   Procedure: REPAIR EXTENSOR TENDON LEFT WITH DEBRIDMENT PROXIMAL INTERPHALANGEAL JOINT;  Surgeon: Wynonia Sours, MD;  Location: [facility];  Service: Orthopedics;  Laterality: Left;  . right arm reconstruction  1/01  . SUPRAVENTRICULAR TACHYCARDIA ABLATION N/A 10/07/2012   Procedure: SUPRAVENTRICULAR TACHYCARDIA ABLATION;  Surgeon: Evans Lance, MD;  Location: [facility] CATH LAB;  Service: Cardiovascular;  Laterality: N/A;    OB History    Gravida  0   Para  0   Term  0   Preterm  0   AB  0   Living  0     SAB  0   TAB  0   Ectopic  0   Multiple  0   Live Births  0            Home Medications    Prior to Admission medications   Medication Sig Start Date End Date Taking? Authorizing Provider  ALBUTEROL IN Inhale into the lungs. 09/10/13  Yes [provider]  albuterol (PROVENTIL HFA;VENTOLIN HFA) 108 (90 Base) MCG/ACT inhaler Inhale into the lungs. 09/10/13   [provider]  azithromycin (ZITHROMAX) 250 MG tablet 2 tablets now and then one tablet for 4 days. 02/19/19   Breeback, Jade L, PA-C  DEXILANT 60 MG capsule TAKE ONE CAPSULE BY MOUTH  ONCE DAILY 11/05/16   Donella Stade, PA-C  HYDROcodone-acetaminophen (NORCO) 10-325 MG tablet Take 1 tablet by mouth every 8 (eight) hours as needed. 11/30/19   Silverio Decamp, MD  montelukast (SINGULAIR) 10 MG tablet TAKE 1 TABLET BY MOUTH EVERY DAY AT BEDTIME . APPOINTMENT REQUIRED FOR FUTURE REFILLS 03/16/19   Gregor Hams, MD  Multiple Vitamins-Minerals (MULTIVITAMIN PO) Take by mouth daily.    [provider]  predniSONE (DELTASONE) 50 MG tablet Take 1 tablet (50 mg total) by mouth daily with breakfast for 5 days. 01/17/20 01/22/20  Noe Gens, PA-C  promethazine-dextromethorphan (PROMETHAZINE-DM) 6.25-15 MG/5ML syrup Take 5 mLs by mouth 4 (four) times daily as needed for cough. 01/17/20   Noe Gens, PA-C    venlafaxine XR (EFFEXOR-XR) 37.5 MG 24 hr capsule TAKE 1 CAPSULE BY MOUTH ONCE DAILY. CAN TAKE UP TO 2 CAPSULES ONCE DAILY AS NEEDED. 08/05/19   Megan Salon, MD  Vitamin D, Ergocalciferol, (DRISDOL) 1.25 MG (50000 UT) CAPS capsule 1 CAPSULE EVERY 7 DAYS FOR 6 DOSES, 09/03/15   [provider]    Family History Family History  Problem Relation Age of Onset  . Alcohol abuse Mother   . Diabetes Mother   . Hyperlipidemia Mother   . Hypertension Mother   . Heart disease Mother        Rheumatic disease  . Alzheimer's disease Father   . Diabetes Sister   . Hyperlipidemia Sister   . Hypertension Sister   . Heart attack Maternal Uncle   . Lung cancer Maternal Grandmother        lung  . Heart attack Maternal Grandfather     Social History Social History   Tobacco Use  . Smoking status: Never Smoker  . Smokeless tobacco: Never Used  Substance Use Topics  . Alcohol use: Yes    Comment: 1-2 glasses wine per night  . Drug use: No     Allergies   Chlorhexidine gluconate, Amoxicillin-pot clavulanate, Codeine sulfate, and Influenza vaccines   Review of Systems Review of Systems  Constitutional: Positive for fever. Negative for chills.  HENT: Positive for congestion. Negative for ear pain, sore throat, trouble swallowing and voice change.   Respiratory: Positive for cough, chest tightness and wheezing. Negative for shortness of breath.   Cardiovascular: Negative for chest pain and palpitations.  Gastrointestinal: Negative for abdominal pain, diarrhea, nausea and vomiting.  Musculoskeletal: Negative for arthralgias, back pain and myalgias.  Skin: Negative for rash.  All other systems reviewed and are negative.    Physical Exam Triage Vital Signs ED Triage Vitals  Enc Vitals Group     BP 01/17/20 1112 (!) 169/98     Pulse Rate 01/17/20 1112 100     Resp 01/17/20 1112 18     Temp 01/17/20 1112 99.1 F (37.3 C)     Temp src --      SpO2 01/17/20 1112 97 %      Weight --      Height --      Head Circumference --      Peak Flow --      Pain Score 01/17/20 1110 0     Pain Loc --      Pain Edu? --      Excl. in [place]? --    No data found.  Updated Vital Signs BP (!) 169/98 (BP Location: Right Arm)   Pulse 100   Temp 99.1 F (37.3 C)   Resp  18   LMP 10/28/2006   SpO2 97%   Visual Acuity Right Eye Distance:   Left Eye Distance:   Bilateral Distance:    Right Eye Near:   Left Eye Near:    Bilateral Near:     Physical Exam Vitals and nursing note reviewed.  Constitutional:      General: She is not in acute distress.    Appearance: Normal appearance. She is well-developed. She is not ill-appearing, toxic-appearing or diaphoretic.  HENT:     Head: Normocephalic and atraumatic.     Right Ear: Tympanic membrane and ear canal normal.     Left Ear: Tympanic membrane and ear canal normal.     Nose: Nose normal.     Right Sinus: No maxillary sinus tenderness or frontal sinus tenderness.     Left Sinus: No maxillary sinus tenderness or frontal sinus tenderness.     Mouth/Throat:     Lips: Pink.     Mouth: Mucous membranes are moist.     Pharynx: Oropharynx is clear. Uvula midline.  Cardiovascular:     Rate and Rhythm: Normal rate and regular rhythm.  Pulmonary:     Effort: Pulmonary effort is normal. No respiratory distress.     Breath sounds: Normal breath sounds. No stridor. No wheezing, rhonchi or rales.     Comments: Dry hacking cough during exam. No respiratory distress. Musculoskeletal:        General: Normal range of motion.     Cervical back: Normal range of motion.  Skin:    General: Skin is warm and dry.  Neurological:     Mental Status: She is alert and oriented to person, place, and time.  Psychiatric:        Behavior: Behavior normal.      UC Treatments / Results  Labs (all labs ordered are listed, but only abnormal results are displayed) Labs Reviewed - No data to display  EKG   Radiology DG Chest 2  View  Result Date: 01/17/2020 CLINICAL DATA:  Fever with cough and congestion EXAM: CHEST - 2 VIEW COMPARISON:  None. FINDINGS: Lungs are clear. Heart size and pulmonary vascularity are normal. No adenopathy. There is degenerative change in the thoracic spine. IMPRESSION: Lungs clear.  Cardiac silhouette within normal limits. Electronically Signed   By: Lowella Grip III M.D.   On: 01/17/2020 12:05    Procedures Procedures (including critical care time)  Medications Ordered in UC Medications  dexamethasone (DECADRON) injection 10 mg (10 mg Intramuscular Given 01/17/20 1135)    Initial Impression / Assessment and Plan / UC Course  I have reviewed the triage vital signs and the nursing notes.  Pertinent labs & imaging results that were available during my care of the patient were reviewed by me and considered in my medical decision making (see chart for details).     Discussed imaging with pt No evidence of pneumonia Will tx symptomatically for viral URI and asthma exacerbation Encouraged f/u with PCP  AVS provided  Final Clinical Impressions(s) / UC Diagnoses   Final diagnoses:  Viral URI with cough  Acute bronchospasm  History of asthma     Discharge Instructions      You were given a shot of decadron (a steroid) today to help with inflammation in your lungs to help you breath better and to help with your cough.  You have been prescribed 5 days of prednisone, an oral steroid.  You may start this medication tomorrow with breakfast.  You may take 500mg  acetaminophen every 4-6 hours or in combination with ibuprofen 400-600mg  every 6-8 hours as needed for pain, inflammation, and fever.  Be sure to well hydrated with clear liquids and get at least 8 hours of sleep at night, preferably more while sick.   Please follow up with family medicine in 1 week if needed.     ED Prescriptions    Medication Sig Dispense Auth. Provider   predniSONE (DELTASONE) 50 MG tablet Take  1 tablet (50 mg total) by mouth daily with breakfast for 5 days. 5 tablet Noe Gens, PA-C   promethazine-dextromethorphan (PROMETHAZINE-DM) 6.25-15 MG/5ML syrup Take 5 mLs by mouth 4 (four) times daily as needed for cough. 118 mL Noe Gens, PA-C     PDMP not reviewed this encounter.   Noe Gens, PA-C 01/17/20 1459

## 2020-01-19 ENCOUNTER — Other Ambulatory Visit: Payer: Self-pay | Admitting: Physician Assistant

## 2020-01-19 DIAGNOSIS — J0140 Acute pansinusitis, unspecified: Secondary | ICD-10-CM

## 2020-01-19 MED ORDER — AZITHROMYCIN 250 MG PO TABS: ORAL_TABLET | ORAL | 0 refills | Status: DC

## 2020-01-19 NOTE — Progress Notes (Signed)
Seen in urgent care. On prednisone and cough syrup. Still coughing. Will send zpak.

## 2020-01-21 ENCOUNTER — Encounter: Payer: Self-pay | Admitting: Physician Assistant

## 2020-01-21 ENCOUNTER — Ambulatory Visit (INDEPENDENT_AMBULATORY_CARE_PROVIDER_SITE_OTHER): Payer: BC Managed Care – PPO | Admitting: Physician Assistant

## 2020-01-21 VITALS — BP 169/80 | HR 102 | Temp 98.2°F | Ht 64.0 in | Wt 223.0 lb

## 2020-01-21 DIAGNOSIS — R05 Cough: Secondary | ICD-10-CM

## 2020-01-21 DIAGNOSIS — R062 Wheezing: Secondary | ICD-10-CM

## 2020-01-21 DIAGNOSIS — J209 Acute bronchitis, unspecified: Secondary | ICD-10-CM

## 2020-01-21 DIAGNOSIS — R059 Cough, unspecified: Secondary | ICD-10-CM

## 2020-01-21 DIAGNOSIS — J440 Chronic obstructive pulmonary disease with acute lower respiratory infection: Secondary | ICD-10-CM | POA: Diagnosis not present

## 2020-01-21 MED ORDER — HYDROCOD POLST-CPM POLST ER 10-8 MG/5ML PO SUER: 5.0000 mL | Freq: Two times a day (BID) | ORAL | 0 refills | Status: DC | PRN

## 2020-01-21 MED ORDER — ALBUTEROL SULFATE HFA 108 (90 BASE) MCG/ACT IN AERS: 2.0000 | INHALATION_SPRAY | Freq: Four times a day (QID) | RESPIRATORY_TRACT | 1 refills | Status: DC | PRN

## 2020-01-21 MED ORDER — CEFTRIAXONE SODIUM 1 G IJ SOLR
1.0000 g | INTRAMUSCULAR | Status: DC
  Administered 2020-01-21: 1 g via INTRAMUSCULAR

## 2020-01-21 MED ORDER — METHYLPREDNISOLONE SODIUM SUCC 125 MG IJ SOLR
125.0000 mg | Freq: Once | INTRAMUSCULAR | Status: AC
  Administered 2020-01-21: 125 mg via INTRAMUSCULAR

## 2020-01-21 MED ORDER — PREDNISONE 20 MG PO TABS: ORAL_TABLET | ORAL | 0 refills | Status: DC

## 2020-01-21 MED ORDER — FLUCONAZOLE 150 MG PO TABS
150.0000 mg | ORAL_TABLET | Freq: Once | ORAL | 0 refills | Status: AC
Start: 2020-01-21 — End: 2020-01-21

## 2020-01-21 NOTE — Patient Instructions (Signed)
Cough syrup.  Prednisone taper.  Finish zpak.  Albuterol inhaler every 3-4 hours.  covid swabbed.    Acute Bronchitis, Adult  Acute bronchitis is sudden or acute swelling of the air tubes (bronchi) in the lungs. Acute bronchitis causes these tubes to fill with mucus, which can make it hard to breathe. It can also cause coughing or wheezing. In adults, acute bronchitis usually goes away within 2 weeks. A cough caused by bronchitis may last up to 3 weeks. Smoking, allergies, and asthma can make the condition worse. What are the causes? This condition can be caused by germs and by substances that irritate the lungs, including:  Cold and flu viruses. The most common cause of this condition is the virus that causes the common cold.  Bacteria.  Substances that irritate the lungs, including: ? Smoke from cigarettes and other forms of tobacco. ? Dust and pollen. ? Fumes from chemical products, gases, or burned fuel. ? Other materials that pollute indoor or outdoor air.  Close contact with someone who has acute bronchitis. What increases the risk? The following factors may make you more likely to develop this condition:  A weak body's defense system, also called the immune system.  A condition that affects your lungs and breathing, such as asthma. What are the signs or symptoms? Common symptoms of this condition include:  Lung and breathing problems, such as: ? Coughing. This may bring up clear, yellow, or green mucus from your lungs (sputum). ? Wheezing. ? Having too much mucus in your lungs (chest congestion). ? Having shortness of breath.  A fever.  Chills.  Aches and pains, including: ? Tightness in your chest and other body aches. ? A sore throat. How is this diagnosed? This condition is usually diagnosed based on:  Your symptoms and medical history.  A physical exam. You may also have other tests, including tests to rule out other conditions, such pneumonia. These  tests include:  A test of lung function.  Test of a mucus sample to look for the presence of bacteria.  Tests to check the oxygen level in your blood.  Blood tests.  Chest X-ray. How is this treated? Most cases of acute bronchitis clear up over time without treatment. Your health care provider may recommend:  Drinking more fluids. This can thin your mucus, which may improve your breathing.  Taking a medicine for a fever or cough.  Using a device that gets medicine into your lungs (inhaler) to help improve breathing and control coughing.  Using a vaporizer or a humidifier. These are machines that add water to the air to help you breathe better. Follow these instructions at home: Activity  Get plenty of rest.  Return to your normal activities as told by your health care provider. Ask your health care provider what activities are safe for you. Lifestyle  Drink enough fluid to keep your urine pale yellow.  Do not drink alcohol.  Do not use any products that contain nicotine or tobacco, such as cigarettes, e-cigarettes, and chewing tobacco. If you need help quitting, ask your health care provider. Be aware that: ? Your bronchitis will get worse if you smoke or breathe in other people's smoke (secondhand smoke). ? Your lungs will heal faster if you quit smoking. General instructions   Take over-the-counter and prescription medicines only as told by your health care provider.  Use an inhaler, vaporizer, or humidifier as told by your health care provider.  If you have a sore throat, gargle with  a salt-water mixture 3-4 times a day or as needed. To make a salt-water mixture, completely dissolve -1 tsp (3-6 g) of salt in 1 cup (237 mL) of warm water.  Keep all follow-up visits as told by your health care provider. This is important. How is this prevented? To lower your risk of getting this condition again:  Wash your hands often with soap and water. If soap and water are not  available, use hand sanitizer.  Avoid contact with people who have cold symptoms.  Try not to touch your mouth, nose, or eyes with your hands.  Avoid places where there are fumes from chemicals. Breathing these fumes will make your condition worse.  Get the flu shot every year. Contact a health care provider if:  Your symptoms do not improve after 2 weeks of treatment.  You vomit more than once or twice.  You have symptoms of dehydration such as: ? Dark urine. ? Dry skin or eyes. ? Increased thirst. ? Headaches. ? Confusion. ? Muscle cramps. Get help right away if you:  Cough up blood.  Feel pain in your chest.  Have severe shortness of breath.  Faint or keep feeling like you are going to faint.  Have a severe headache.  Have fever or chills that get worse. These symptoms may represent a serious problem that is an emergency. Do not wait to see if the symptoms will go away. Get medical help right away. Call your local emergency services (911 in the [place] Do not drive yourself to the hospital. Summary  Acute bronchitis is sudden (acute) inflammation of the air tubes (bronchi) between the windpipe and the lungs. In adults, acute bronchitis usually goes away within 2 weeks, although coughing may last 3 weeks or longer  Take over-the-counter and prescription medicines only as told by your health care provider.  Drink enough fluid to keep your urine pale yellow.  Contact a health care provider if your symptoms do not improve after 2 weeks of treatment.  Get help right away if you cough up blood, faint, or have chest pain or shortness of breath. This information is not intended to replace advice given to you by your health care provider. Make sure you discuss any questions you have with your health care provider. Document Revised: 04/05/2019 Document Reviewed: 02/12/2019 Elsevier Patient Education  [place].

## 2020-01-21 NOTE — Progress Notes (Signed)
Subjective:    Patient ID: Tracy Landry, female    DOB: Nov 04, 1960, 59 y.o.   MRN: [id number]  HPI  Pt is a 59 yo female with asthma who presents to the clinic with cough for a week that is dry/hacking, congested, chest tightness and fever. Max temp 101. She went to UC on 01/17/2020 and given prednisone, albuterol, tylenol, cough syrup. She has not improved and she called office. zpak was sent 01/19/2020. No sick contacts. She has had covid vaccine.  She continues to cough. She cannot sleep. She continues to have chest tightness. Albuterol helps minimally. CXR at UC negative for pneumonia. No GI issues. No loss of smell or taste.   She takes singulair/dexilant daily.   .. Active Ambulatory Problems    Diagnosis Date Noted   HYPERLIPIDEMIA 08/22/2006   OBESITY NOS 06/01/2006   ASTHMA, PERSISTENT, MILD 08/12/2006   GASTROESOPHAGEAL REFLUX, NO ESOPHAGITIS 05/13/2006   ANKYLOSING SPONDYLITIS 01/06/2007   OSTEOPENIA 05/13/2006   COUGH 05/15/2010   SVT (supraventricular tachycardia) ([place]) 04/15/2012   Fatigue 04/15/2012   Essential hypertension 07/15/2012   Plantar fasciitis, left 06/09/2014   Anxiety state 03/28/2015   Low back pain 02/22/2016   Primary osteoarthritis of right knee, status post left total knee arthroplasty 05/08/2016   Periscapular pain 06/03/2016   Depression 03/15/2011   Primary osteoarthritis of first carpometacarpal joint of right hand 03/26/2018   Resolved Ambulatory Problems    Diagnosis Date Noted   VIRAL URI 07/14/2007   RHINITIS, ALLERGIC 05/13/2006   Palpitations 08/03/2007   CHEST PAIN, RIGHT 05/15/2010   Essential hypertension, malignant 04/15/2012   Thrush of mouth and esophagus ([place]) 03/26/2013   Right shoulder pain 02/03/2014   Peroneal tendinitis of left lower leg 04/01/2014   Left tibialis posterior tendonitis 08/08/2014   Puncture wound 09/13/2015   Right wrist pain 09/26/2016   Closed Colles' fracture of right  radius 10/30/2015   Past Medical History:  Diagnosis Date   Asthma    Back pain    GERD (gastroesophageal reflux disease)    Hypertension    Wears glasses        Review of Systems See HPI.     Objective:   Physical Exam Vitals reviewed.  Constitutional:      Appearance: Normal appearance. She is obese.  Cardiovascular:     Rate and Rhythm: Regular rhythm. Tachycardia present.     Pulses: Normal pulses.  Pulmonary:     Comments: Hacking productive cough during exam.  Bilateral rhonchi/wheezing more upper than lower.  Neurological:     General: No focal deficit present.     Mental Status: She is alert and oriented to person, place, and time.  Psychiatric:        Mood and Affect: Mood normal.           Assessment & Plan:  Marland KitchenMarland KitchenNada was seen today for cough.  Diagnoses and all orders for this visit:  Acute bronchitis, unspecified organism -     chlorpheniramine-HYDROcodone ([place]) 10-8 MG/5ML SUER; Take 5 mLs by mouth every 12 (twelve) hours as needed. -     predniSONE (DELTASONE) 20 MG tablet; Take 3 tablets for 3 days, take 2 tablets for 3 days, take 1 tablet for 3 days, take 1/2 tablet for 4 days. -     albuterol (VENTOLIN HFA) 108 (90 Base) MCG/ACT inhaler; Inhale 2 puffs into the lungs every 6 (six) hours as needed. -     Novel Coronavirus, NAA ([facility]) -  cefTRIAXone (ROCEPHIN) injection 1 g -     methylPREDNISolone sodium succinate (SOLU-MEDROL) 125 mg/2 mL injection 125 mg  Cough -     chlorpheniramine-HYDROcodone (TUSSIONEX) 10-8 MG/5ML SUER; Take 5 mLs by mouth every 12 (twelve) hours as needed. -     predniSONE (DELTASONE) 20 MG tablet; Take 3 tablets for 3 days, take 2 tablets for 3 days, take 1 tablet for 3 days, take 1/2 tablet for 4 days. -     albuterol (VENTOLIN HFA) 108 (90 Base) MCG/ACT inhaler; Inhale 2 puffs into the lungs every 6 (six) hours as needed. -     Novel Coronavirus, NAA ([facility]) -     cefTRIAXone (ROCEPHIN) injection  1 g -     methylPREDNISolone sodium succinate (SOLU-MEDROL) 125 mg/2 mL injection 125 mg  Wheezing -     chlorpheniramine-HYDROcodone (TUSSIONEX) 10-8 MG/5ML SUER; Take 5 mLs by mouth every 12 (twelve) hours as needed. -     predniSONE (DELTASONE) 20 MG tablet; Take 3 tablets for 3 days, take 2 tablets for 3 days, take 1 tablet for 3 days, take 1/2 tablet for 4 days. -     albuterol (VENTOLIN HFA) 108 (90 Base) MCG/ACT inhaler; Inhale 2 puffs into the lungs every 6 (six) hours as needed. -     Novel Coronavirus, NAA ([facility]) -     cefTRIAXone (ROCEPHIN) injection 1 g -     methylPREDNISolone sodium succinate (SOLU-MEDROL) 125 mg/2 mL injection 125 mg  Other orders -     fluconazole (DIFLUCAN) 150 MG tablet; Take 1 tablet (150 mg total) by mouth once for 1 dose. Take additional tablet PRN   Lungs sound terrible. Pulse ox 94 percent. BP up and HR up. Shot of solumedrol and rocephin given IM today. Pt has asthma history. Concern this has moved into pneumonia.  Had covid vaccine. Swabbed for Covid. Isolate until results return.  Higher dose prednisone to start.  Finish zpak.  tussionex given.  Albuterol refilled use every 4-6 hours.  Symptomatic care discussed.  Diflucan for abx and yeast infection.   Discussed reasons to go to ED. Let me know how you are doing on Monday.

## 2020-01-22 LAB — NOVEL CORONAVIRUS, NAA: SARS-CoV-2, NAA: NOT DETECTED

## 2020-01-22 LAB — SARS-COV-2, NAA 2 DAY TAT

## 2020-01-27 ENCOUNTER — Telehealth: Payer: Self-pay | Admitting: Obstetrics & Gynecology

## 2020-01-27 NOTE — Telephone Encounter (Signed)
Spoke with pt. Pt states having URI that was diagnosed on 01/17/20. Pt states still having intermittent coughing. Denies fever, chills. Pt had negative Covid testing 01/21/20. Pt has hx of asthma.  Pt advised can keep appt with Dr Sabra Heck for AEX or can reschedule. Pt agreeable to keep appt. Pt aware might not be able to have labs at that time and may have to come back in for those. Pt agreeable. CPS neg.   Routing to Dr Sabra Heck for review.  Encounter closed.

## 2020-01-27 NOTE — Telephone Encounter (Signed)
Patient stated that during her covid screening call that she is "battling a URI and still has a cough, however tested negative for covid". Will she need to reschedule her aex tomorrow?,

## 2020-01-27 NOTE — Progress Notes (Deleted)
59 y.o. G0P0000 Married White or Caucasian female here for annual exam.    Patient's last menstrual period was 10/28/2006.          Sexually active: {yes no:314532}  The current method of family planning is status post hysterectomy.    Exercising: {yes no:314532}  {types:19826} Smoker:  {YES [id number]  Health Maintenance: Pap:  08-07-2006 neg History of abnormal Pap:  no MMG:  11-11-2019 category c density birads 1:neg Colonoscopy:  05-08-12 f/u 45yrs BMD:   10-11-15 normal TDaP:  2015 Pneumonia vaccine(s):  2018 Shingrix:   Zoster 2014 Hep C testing: neg 2016 Screening Labs: ***   reports that she has never smoked. She has never used smokeless tobacco. She reports current alcohol use. She reports that she does not use drugs.  Past Medical History:  Diagnosis Date  . Asthma   . Back pain   . Depression   . GERD (gastroesophageal reflux disease)   . Hypertension   . 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  . Wears glasses     Past Surgical History:  Procedure Laterality Date  . ABDOMINAL HYSTERECTOMY  3/09  . ABLATION OF DYSRHYTHMIC FOCUS  10/07/2012   SVT  . APPENDECTOMY    . FOOT SURGERY  2000   rt foot fx  . HARDWARE REMOVAL  2003   rt arm  . MASS EXCISION Left 07/07/2013   Procedure: EXCISION MASS LEFT RING FINGER;  Surgeon: Wynonia Sours, MD;  Location: [place];  Service: Orthopedics;  Laterality: Left;  . REPAIR EXTENSOR TENDON Left 07/07/2013   Procedure: REPAIR EXTENSOR TENDON LEFT WITH DEBRIDMENT PROXIMAL INTERPHALANGEAL JOINT;  Surgeon: Wynonia Sours, MD;  Location: [place];  Service: Orthopedics;  Laterality: Left;  . right arm reconstruction  1/01  . SUPRAVENTRICULAR TACHYCARDIA ABLATION N/A 10/07/2012   Procedure: SUPRAVENTRICULAR TACHYCARDIA ABLATION;  Surgeon: Evans Lance, MD;  Location: [facility];  Service: Cardiovascular;  Laterality: N/A;    Current Outpatient Medications  Medication Sig  Dispense Refill  . albuterol (PROVENTIL HFA;VENTOLIN HFA) 108 (90 Base) MCG/ACT inhaler Inhale into the lungs.    Marland Kitchen albuterol (VENTOLIN HFA) 108 (90 Base) MCG/ACT inhaler Inhale 2 puffs into the lungs every 6 (six) hours as needed. 18 g 1  . ALBUTEROL IN Inhale into the lungs.    Marland Kitchen azithromycin (ZITHROMAX) 250 MG tablet 2 tablets now and then one tablet for 4 days. 6 tablet 0  . chlorpheniramine-HYDROcodone (TUSSIONEX) 10-8 MG/5ML SUER Take 5 mLs by mouth every 12 (twelve) hours as needed. 120 mL 0  . DEXILANT 60 MG capsule TAKE ONE CAPSULE BY MOUTH ONCE DAILY 30 capsule 11  . HYDROcodone-acetaminophen (NORCO) 10-325 MG tablet Take 1 tablet by mouth every 8 (eight) hours as needed. 15 tablet 0  . montelukast (SINGULAIR) 10 MG tablet TAKE 1 TABLET BY MOUTH EVERY DAY AT BEDTIME . APPOINTMENT REQUIRED FOR FUTURE REFILLS 30 tablet 0  . Multiple Vitamins-Minerals (MULTIVITAMIN PO) Take by mouth daily.    . predniSONE (DELTASONE) 20 MG tablet Take 3 tablets for 3 days, take 2 tablets for 3 days, take 1 tablet for 3 days, take 1/2 tablet for 4 days. 20 tablet 0  . promethazine-dextromethorphan (PROMETHAZINE-DM) 6.25-15 MG/5ML syrup Take 5 mLs by mouth 4 (four) times daily as needed for cough. 118 mL 0  . venlafaxine XR (EFFEXOR-XR) 37.5 MG 24 hr capsule TAKE 1 CAPSULE BY MOUTH ONCE DAILY. CAN TAKE UP  TO 2 CAPSULES ONCE DAILY AS NEEDED. 30 capsule 5  . Vitamin D, Ergocalciferol, (DRISDOL) 1.25 MG (50000 UT) CAPS capsule 1 CAPSULE EVERY 7 DAYS FOR 6 DOSES,     Current Facility-Administered Medications  Medication Dose Route Frequency Provider Last Rate Last Admin  . cefTRIAXone (ROCEPHIN) injection 1 g  1 g Intramuscular Q24H Breeback, Jade L, PA-C   1 g at 01/21/20 1156    Family History  Problem Relation Age of Onset  . Alcohol abuse Mother   . Diabetes Mother   . Hyperlipidemia Mother   . Hypertension Mother   . Heart disease Mother        Rheumatic disease  . Alzheimer's disease Father   .  Diabetes Sister   . Hyperlipidemia Sister   . Hypertension Sister   . Heart attack Maternal Uncle   . Lung cancer Maternal Grandmother        lung  . Heart attack Maternal Grandfather     Review of Systems  Exam:   LMP 10/28/2006      General appearance: alert, cooperative and appears stated age Head: Normocephalic, without obvious abnormality, atraumatic Neck: no adenopathy, supple, symmetrical, trachea midline and thyroid {EXAM; THYROID:18604} Lungs: clear to auscultation bilaterally Breasts: {Exam; breast:13139::"normal appearance, no masses or tenderness"} Heart: regular rate and rhythm Abdomen: soft, non-tender; bowel sounds normal; no masses,  no organomegaly Extremities: extremities normal, atraumatic, no cyanosis or edema Skin: Skin color, texture, turgor normal. No rashes or lesions Lymph nodes: Cervical, supraclavicular, and axillary nodes normal. No abnormal inguinal nodes palpated Neurologic: Grossly normal   Pelvic: External genitalia:  no lesions              Urethra:  normal appearing urethra with no masses, tenderness or lesions              Bartholins and Skenes: normal                 Vagina: normal appearing vagina with normal color and discharge, no lesions              Cervix: {exam; cervix:14595}              Pap taken: {yes no:314532} Bimanual Exam:  Uterus:  {exam; uterus:12215}              Adnexa: {exam; adnexa:12223}               Rectovaginal: Confirms               Anus:  normal sphincter tone, no lesions  Chaperone, ***Terence Lux, CMA, was present for exam.  A:  Well Woman with normal exam  P:   {plan; gyn:5269::"mammogram","pap smear","return annually or prn"}

## 2020-01-28 ENCOUNTER — Ambulatory Visit: Payer: Self-pay

## 2020-01-28 ENCOUNTER — Ambulatory Visit (INDEPENDENT_AMBULATORY_CARE_PROVIDER_SITE_OTHER): Payer: BLUE CROSS/BLUE SHIELD | Admitting: Obstetrics & Gynecology

## 2020-01-28 ENCOUNTER — Ambulatory Visit: Payer: BLUE CROSS/BLUE SHIELD | Admitting: Obstetrics & Gynecology

## 2020-01-28 ENCOUNTER — Other Ambulatory Visit: Payer: Self-pay

## 2020-01-28 ENCOUNTER — Encounter: Payer: Self-pay | Admitting: Obstetrics & Gynecology

## 2020-01-28 VITALS — BP 122/82 | HR 68 | Temp 97.1°F | Resp 16 | Ht 64.25 in | Wt 221.0 lb

## 2020-01-28 DIAGNOSIS — Z01419 Encounter for gynecological examination (general) (routine) without abnormal findings: Secondary | ICD-10-CM | POA: Diagnosis not present

## 2020-01-28 MED ORDER — VENLAFAXINE HCL ER 37.5 MG PO CP24: ORAL_CAPSULE | ORAL | 4 refills | Status: DC

## 2020-01-28 NOTE — Progress Notes (Signed)
59 y.o. G0P0000 Married White or Caucasian female here for annual exam.  Had TKR.  Patella ruptured with PT.  Ended up seeing Dr. Lorre Nick for second opinion and second surgery.  Finally, saw Dr. Mayer Camel who had to remove the patella because it was necrotic.  Now in a thigh length brace.  Has follow up on Monday.  Hopefully she will start PT after her visit next week.  Denies vaginal bleeding.     Has URI that is improved.  Has taken two doses of antibiotics.  Currently, on a steroid taper.    Patient's last menstrual period was 10/28/2006.          Sexually active: No.  The current method of family planning is status post hysterectomy.    Exercising: No.  exercise Smoker:  no  Health Maintenance: Pap:  08-07-2006 neg History of abnormal Pap:  no MMG:  11-11-2019 category c density birads 1:neg  Colonoscopy:  05-08-12 f/u 61yrs BMD:   10-11-15 normal TDaP:  2015 Pneumonia vaccine(s):  2015, 2018 Shingrix:   2014 zoster Hep C testing: neg 2016 Screening Labs: 10/06/2019   reports that she has never smoked. She has never used smokeless tobacco. She reports current alcohol use. She reports that she does not use drugs.  Past Medical History:  Diagnosis Date  . Asthma   . Back pain   . Depression   . GERD (gastroesophageal reflux disease)   . Hypertension   . 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  . Wears glasses     Past Surgical History:  Procedure Laterality Date  . ABDOMINAL HYSTERECTOMY  3/09  . ABLATION OF DYSRHYTHMIC FOCUS  10/07/2012   SVT  . APPENDECTOMY    . FOOT SURGERY  2000   rt foot fx  . HARDWARE REMOVAL  2003   rt arm  . MASS EXCISION Left 07/07/2013   Procedure: EXCISION MASS LEFT RING FINGER;  Surgeon: Wynonia Sours, MD;  Location: [place];  Service: Orthopedics;  Laterality: Left;  . PATELLECTOMY    . REPAIR EXTENSOR TENDON Left 07/07/2013   Procedure: REPAIR EXTENSOR TENDON LEFT WITH DEBRIDMENT PROXIMAL  INTERPHALANGEAL JOINT;  Surgeon: Wynonia Sours, MD;  Location: [place];  Service: Orthopedics;  Laterality: Left;  . right arm reconstruction  1/01  . SUPRAVENTRICULAR TACHYCARDIA ABLATION N/A 10/07/2012   Procedure: SUPRAVENTRICULAR TACHYCARDIA ABLATION;  Surgeon: Evans Lance, MD;  Location: [facility] CATH LAB;  Service: Cardiovascular;  Laterality: N/A;  . TOTAL KNEE ARTHROPLASTY      Current Outpatient Medications  Medication Sig Dispense Refill  . albuterol (PROVENTIL HFA;VENTOLIN HFA) 108 (90 Base) MCG/ACT inhaler Inhale into the lungs.    . chlorpheniramine-HYDROcodone (TUSSIONEX) 10-8 MG/5ML SUER Take 5 mLs by mouth every 12 (twelve) hours as needed. 120 mL 0  . cyclobenzaprine (FLEXERIL) 10 MG tablet TAKE 1 TABLET BY MOUTH NIGHTLY AS NEEDED    . diclofenac (VOLTAREN) 75 MG EC tablet Take 1 tablet daily as needed for pain.    Marland Kitchen esomeprazole (NEXIUM) 20 MG capsule Take by mouth.    . fluconazole (DIFLUCAN) 150 MG tablet Take by mouth.    . montelukast (SINGULAIR) 10 MG tablet TAKE 1 TABLET BY MOUTH EVERY DAY AT BEDTIME . APPOINTMENT REQUIRED FOR FUTURE REFILLS 30 tablet 0  . pravastatin (PRAVACHOL) 20 MG tablet Take 20 mg by mouth daily.    . predniSONE (DELTASONE) 20 MG tablet Take 3 tablets for  3 days, take 2 tablets for 3 days, take 1 tablet for 3 days, take 1/2 tablet for 4 days. 20 tablet 0  . promethazine-dextromethorphan (PROMETHAZINE-DM) 6.25-15 MG/5ML syrup Take 5 mLs by mouth 4 (four) times daily as needed for cough. 118 mL 0  . venlafaxine XR (EFFEXOR-XR) 37.5 MG 24 hr capsule TAKE 1 CAPSULE BY MOUTH ONCE DAILY. CAN TAKE UP TO 2 CAPSULES ONCE DAILY AS NEEDED. 30 capsule 5   Current Facility-Administered Medications  Medication Dose Route Frequency Provider Last Rate Last Admin  . cefTRIAXone (ROCEPHIN) injection 1 g  1 g Intramuscular Q24H Breeback, Jade L, PA-C   1 g at 01/21/20 1156    Family History  Problem Relation Age of Onset  . Alcohol abuse Mother   .  Diabetes Mother   . Hyperlipidemia Mother   . Hypertension Mother   . Heart disease Mother        Rheumatic disease  . Alzheimer's disease Father   . Diabetes Sister   . Hyperlipidemia Sister   . Hypertension Sister   . Heart attack Maternal Uncle   . Lung cancer Maternal Grandmother        lung  . Heart attack Maternal Grandfather     Review of Systems  Constitutional: Negative.   HENT: Negative.   Eyes: Negative.   Respiratory: Negative.   Cardiovascular: Negative.   Gastrointestinal: Negative.   Endocrine: Negative.   Genitourinary: Negative.   Musculoskeletal: Negative.   Skin: Negative.   Allergic/Immunologic: Negative.   Neurological: Negative.   Hematological: Negative.   Psychiatric/Behavioral: Negative.     Exam:   BP 122/82   Pulse 68   Temp (!) 97.1 F (36.2 C) (Skin)   Resp 16   Ht 5' 4.25" (1.632 m)   Wt 221 lb (100.2 kg)   LMP 10/28/2006   BMI 37.64 kg/m   Height: 5' 4.25" (163.2 cm)  General appearance: alert, cooperative and appears stated age Head: Normocephalic, without obvious abnormality, atraumatic Neck: no adenopathy, supple, symmetrical, trachea midline and thyroid normal to inspection and palpation Lungs: clear to auscultation bilaterally Breasts: normal appearance, no masses or tenderness Heart: regular rate and rhythm Abdomen: soft, non-tender; bowel sounds normal; no masses,  no organomegaly Extremities: extremities normal, atraumatic, no cyanosis or edema Skin: Skin color, texture, turgor normal. No rashes or lesions Lymph nodes: Cervical, supraclavicular, and axillary nodes normal. No abnormal inguinal nodes palpated Neurologic: Grossly normal   Pelvic: External genitalia:  no lesions              Urethra:  normal appearing urethra with no masses, tenderness or lesions              Bartholins and Skenes: normal                 Vagina: normal appearing vagina with normal color and discharge, no lesions              Cervix:  absent              Pap taken: No. Bimanual Exam:  Uterus:  uterus absent              Adnexa: no mass, fullness, tenderness               Rectovaginal: Confirms               Anus:  normal sphincter tone, no lesions  Chaperone, Terence Lux, CMA, was present for exam.  A:  Well Woman with normal exam H/o TAH/RSO No HRT Osteopenia H/o colon polyps H/o SVT s/p cardiac ablation 10/07/12 H/o depression Multiple knee surgeries in the last year  P:   Mammogram guidelines reviewed and UTD pap smear not obtained RF for Effexor XR 37.5mg  daily.  #90/4Rf Lab work done 10/2019 Colonoscopy UTD Plan BMD next year with MMT return annually or prn

## 2020-01-31 MED ORDER — HYDROCOD POLST-CPM POLST ER 10-8 MG/5ML PO SUER: 5.0000 mL | Freq: Two times a day (BID) | ORAL | 0 refills | Status: DC | PRN

## 2020-01-31 NOTE — Addendum Note (Signed)
Addended byDonella Stade on: 01/31/2020 12:08 PM   Modules accepted: Orders

## 2020-02-01 DIAGNOSIS — M9712XA Periprosthetic fracture around internal prosthetic left knee joint, initial encounter: Secondary | ICD-10-CM | POA: Diagnosis not present

## 2020-02-01 DIAGNOSIS — M25562 Pain in left knee: Secondary | ICD-10-CM | POA: Diagnosis not present

## 2020-02-09 ENCOUNTER — Encounter: Payer: Self-pay | Admitting: Physician Assistant

## 2020-02-09 DIAGNOSIS — R059 Cough, unspecified: Secondary | ICD-10-CM

## 2020-02-09 DIAGNOSIS — R062 Wheezing: Secondary | ICD-10-CM

## 2020-02-09 DIAGNOSIS — J209 Acute bronchitis, unspecified: Secondary | ICD-10-CM

## 2020-02-09 MED ORDER — PREDNISONE 20 MG PO TABS: ORAL_TABLET | ORAL | 0 refills | Status: DC

## 2020-03-23 ENCOUNTER — Ambulatory Visit (INDEPENDENT_AMBULATORY_CARE_PROVIDER_SITE_OTHER): Payer: BLUE CROSS/BLUE SHIELD | Admitting: Sports Medicine

## 2020-03-23 ENCOUNTER — Ambulatory Visit (INDEPENDENT_AMBULATORY_CARE_PROVIDER_SITE_OTHER): Payer: BLUE CROSS/BLUE SHIELD

## 2020-03-23 DIAGNOSIS — M25561 Pain in right knee: Secondary | ICD-10-CM | POA: Diagnosis not present

## 2020-03-23 DIAGNOSIS — M1711 Unilateral primary osteoarthritis, right knee: Secondary | ICD-10-CM

## 2020-03-23 NOTE — Assessment & Plan Note (Signed)
Right knee aspiration and injection as above, previously done about 3 and half months ago. Return as needed.

## 2020-03-23 NOTE — Progress Notes (Signed)
    Procedures performed today:    Procedure: Real-time Ultrasound Guided aspiration/injection of right knee Device: Samsung HS60  Verbal informed consent obtained.  Time-out conducted.  Noted no overlying erythema, induration, or other signs of local infection.  Skin prepped in a sterile fashion.  Local anesthesia: Topical Ethyl chloride.  With sterile technique and under real time ultrasound guidance:  I advanced an 18-gauge needle into the suprapatellar recess, aspirated 35 mL of clear, straw-colored fluid, syringe switched and 1 cc Kenalog 40, 2 cc lidocaine, 2 cc bupivacaine injected easily Completed without difficulty  Pain immediately resolved suggesting accurate placement of the medication.  Advised to call if fevers/chills, erythema, induration, drainage, or persistent bleeding.  Images permanently stored and available for review in the ultrasound unit.  Impression: Technically successful ultrasound guided injection.  Independent interpretation of notes and tests performed by another provider:   None.  Brief History, Exam, Impression, and Recommendations:    Primary osteoarthritis of right knee, status post left total knee arthroplasty/revision patellectomy Right knee aspiration and injection as above, previously done about 3 and half months ago. Return as needed.    ___________________________________________ Gwen Her. Dianah Field, M.D., ABFM., CAQSM. Primary Care and [place] Instructor of [place] of [facility] of Medicine

## 2020-04-25 ENCOUNTER — Other Ambulatory Visit: Payer: Self-pay

## 2020-04-25 ENCOUNTER — Ambulatory Visit (INDEPENDENT_AMBULATORY_CARE_PROVIDER_SITE_OTHER): Payer: BLUE CROSS/BLUE SHIELD

## 2020-04-25 ENCOUNTER — Ambulatory Visit (INDEPENDENT_AMBULATORY_CARE_PROVIDER_SITE_OTHER): Payer: BLUE CROSS/BLUE SHIELD | Admitting: Sports Medicine

## 2020-04-25 DIAGNOSIS — M19011 Primary osteoarthritis, right shoulder: Secondary | ICD-10-CM

## 2020-04-25 DIAGNOSIS — M1811 Unilateral primary osteoarthritis of first carpometacarpal joint, right hand: Secondary | ICD-10-CM | POA: Diagnosis not present

## 2020-04-25 DIAGNOSIS — M25511 Pain in right shoulder: Secondary | ICD-10-CM | POA: Diagnosis not present

## 2020-04-25 NOTE — Assessment & Plan Note (Signed)
Tracy Landry is also having recurrence of pain in her right hand, previous CMC injection was in 2019. Repeated today.

## 2020-04-25 NOTE — Assessment & Plan Note (Signed)
Tracy Landry is a very pleasant 59 year old female, she has chronic right shoulder pain referable to the glenohumeral joint, x-rays in 2015 confirmed this. Repeating x-rays today, glenohumeral injection today, return to see me in 1 month.

## 2020-04-25 NOTE — Progress Notes (Signed)
    Procedures performed today:    Procedure: Real-time Ultrasound Guided injection of the right glenohumeral joint Device: Samsung HS60  Verbal informed consent obtained.  Time-out conducted.  Noted no overlying erythema, induration, or other signs of local infection.  Skin prepped in a sterile fashion.  Local anesthesia: Topical Ethyl chloride.  With sterile technique and under real time ultrasound guidance: 1 cc Kenalog 40, 2 cc lidocaine, 2 cc bupivacaine injected easily Completed without difficulty  Pain immediately resolved suggesting accurate placement of the medication.  Advised to call if fevers/chills, erythema, induration, drainage, or persistent bleeding.  Images permanently stored and available for review in PACS.  Impression: Technically successful ultrasound guided injection.  Procedure: Real-time Ultrasound Guided injection of the right first [facility] Device: Samsung HS60  Verbal informed consent obtained.  Time-out conducted.  Noted no overlying erythema, induration, or other signs of local infection.  Skin prepped in a sterile fashion.  Local anesthesia: Topical Ethyl chloride.  With sterile technique and under real time ultrasound guidance:  1/2 cc lidocaine, 1/2 cc kenalog 40 injected easily  completed without difficulty  Pain immediately resolved suggesting accurate placement of the medication.  Advised to call if fevers/chills, erythema, induration, drainage, or persistent bleeding.  Images permanently stored and available for review in PACS.  Impression: Technically successful ultrasound guided injection.  Independent interpretation of notes and tests performed by another provider:   None.  Brief History, Exam, Impression, and Recommendations:    Primary osteoarthritis, right shoulder Tracy Landry is a very pleasant 59 year old female, she has chronic right shoulder pain referable to the glenohumeral joint, x-rays in 2015 confirmed this. Repeating x-rays today,  glenohumeral injection today, return to see me in 1 month.  Primary osteoarthritis of first carpometacarpal joint of right hand Tracy Landry is also having recurrence of pain in her right hand, previous [place] injection was in 2019. Repeated today.    ___________________________________________ Gwen Her. Dianah Field, M.D., ABFM., CAQSM. Primary Care and [place] Instructor of [place] of [facility] of Medicine

## 2020-05-10 ENCOUNTER — Encounter: Payer: Self-pay | Admitting: Obstetrics & Gynecology

## 2020-05-29 ENCOUNTER — Ambulatory Visit: Payer: BLUE CROSS/BLUE SHIELD | Admitting: Physician Assistant

## 2020-05-30 ENCOUNTER — Telehealth: Payer: Self-pay

## 2020-05-30 DIAGNOSIS — Z01419 Encounter for gynecological examination (general) (routine) without abnormal findings: Secondary | ICD-10-CM

## 2020-05-30 MED ORDER — VENLAFAXINE HCL ER 75 MG PO CP24: ORAL_CAPSULE | ORAL | 2 refills | Status: DC

## 2020-05-30 NOTE — Telephone Encounter (Signed)
Patient is calling in regards to refill of Effexor. Patient states pharmacy needs a new prescription. Patient stated she "takes 2 tablets per day instead of 1".

## 2020-05-30 NOTE — Telephone Encounter (Signed)
Pt had sent following mychart message on 05/20/20: Tracy Danker "[name]" to Tracy Salon, MD     05/10/20 1:55 PM I wanted to let you know for refill reasons I started on two per day on September 17th because of stress. My wife tried to tell you earlier and got told not to do that anymore and I am slow at telling you. ( I am sorry about that) It has helped a great deal in helping me deal with the [place] people. Thank you for your heart and for taking care of me. Tracy Landry Aug 10, 1960  [phone]   Pt called today to request refill on Effexor Rx  Last Rx sent was 01/2020 for Effexor XR 37.5 mg daily # 90, 4RF.   Routing to Dr Sabra Heck, please advise

## 2020-06-12 NOTE — Telephone Encounter (Signed)
RF was completed on 05/30/2020.  Ok to close encounter.

## 2020-06-13 ENCOUNTER — Encounter: Payer: Self-pay | Admitting: Physician Assistant

## 2020-06-14 MED ORDER — DICLOFENAC SODIUM 75 MG PO TBEC: 75.0000 mg | DELAYED_RELEASE_TABLET | Freq: Every day | ORAL | 3 refills | Status: DC | PRN

## 2020-06-15 ENCOUNTER — Encounter: Payer: Self-pay | Admitting: Podiatry

## 2020-06-15 ENCOUNTER — Other Ambulatory Visit: Payer: Self-pay

## 2020-06-15 ENCOUNTER — Ambulatory Visit (INDEPENDENT_AMBULATORY_CARE_PROVIDER_SITE_OTHER): Payer: BLUE CROSS/BLUE SHIELD | Admitting: Podiatry

## 2020-06-15 DIAGNOSIS — M722 Plantar fascial fibromatosis: Secondary | ICD-10-CM

## 2020-06-17 NOTE — Progress Notes (Signed)
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  She states that she had a knee replacement done on the left side with multiple complications and feels that this possibly worsened her condition.  Ultimately ended up removing her patella.  She states that these lesions have started to grow and are affecting her ability to perform her daily activities they have become painful and she would rather have them removed then treated.  Objective: Vital signs are stable alert oriented x3.  Pulses are palpable.  Large multiple nonpulsatile nodules to the plantar aspect of her left foot along the medial longitudinal arch and the medial band of the plantar fascia.  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  History of Dupuytren's contractures and plantar fibromatosis.  Plan: Discussed etiology pathology conservative versus surgical therapies at this point we have consented her for excision of painful plantar fibromas left foot.  We would like to have this done as soon as possible.  We did discuss the possible complications which may include but are not limited to postop pain bleeding swelling infection recurrence need for further surgery overcorrection under correction loss of digit loss of limb loss of life.  We will do this at the [facility] surgical center we provided her with it with information regarding the center she has been there before in the past.  I will follow-up with her in the near future for surgical intervention

## 2020-06-24 ENCOUNTER — Encounter: Payer: Self-pay | Admitting: Podiatry

## 2020-06-27 ENCOUNTER — Telehealth: Payer: Self-pay

## 2020-06-27 NOTE — Telephone Encounter (Signed)
[name] called to cancel her surgery with Dr. Milinda Pointer on 07/21/2020. She stated "I am currently not able to do the non weight bearing for 3 weeks after surgery. My surgery on my left knee makes it impossible to use one of those knee scooters. And my right knee is too weak to take up all the extra work...not to mention diminishing upper body strength. I don't want to start something I can't finish." I notified Caren Griffins with [facility] and Dr. Milinda Pointer

## 2020-06-30 DIAGNOSIS — S40911A Unspecified superficial injury of right shoulder, initial encounter: Secondary | ICD-10-CM | POA: Diagnosis not present

## 2020-06-30 DIAGNOSIS — X500XXA Overexertion from strenuous movement or load, initial encounter: Secondary | ICD-10-CM | POA: Diagnosis not present

## 2020-06-30 DIAGNOSIS — M25511 Pain in right shoulder: Secondary | ICD-10-CM | POA: Diagnosis not present

## 2020-07-08 ENCOUNTER — Encounter: Payer: Self-pay | Admitting: Physician Assistant

## 2020-07-10 MED ORDER — MONTELUKAST SODIUM 10 MG PO TABS: 10.0000 mg | ORAL_TABLET | Freq: Every day | ORAL | 3 refills | Status: DC

## 2020-07-10 MED ORDER — PRAVASTATIN SODIUM 20 MG PO TABS: 20.0000 mg | ORAL_TABLET | Freq: Every day | ORAL | 1 refills | Status: DC

## 2020-07-10 MED ORDER — CYCLOBENZAPRINE HCL 10 MG PO TABS: 10.0000 mg | ORAL_TABLET | Freq: Every day | ORAL | 2 refills | Status: DC

## 2020-07-27 ENCOUNTER — Encounter: Payer: BLUE CROSS/BLUE SHIELD | Admitting: Podiatry

## 2020-08-03 ENCOUNTER — Encounter: Payer: BLUE CROSS/BLUE SHIELD | Admitting: Podiatry

## 2020-08-22 ENCOUNTER — Encounter: Payer: BLUE CROSS/BLUE SHIELD | Admitting: Podiatry

## 2020-09-05 ENCOUNTER — Encounter: Payer: BLUE CROSS/BLUE SHIELD | Admitting: Podiatry

## 2020-10-04 ENCOUNTER — Other Ambulatory Visit: Payer: Self-pay | Admitting: Physician Assistant

## 2020-11-30 ENCOUNTER — Other Ambulatory Visit: Payer: Self-pay

## 2020-11-30 ENCOUNTER — Ambulatory Visit (INDEPENDENT_AMBULATORY_CARE_PROVIDER_SITE_OTHER): Payer: 59

## 2020-11-30 ENCOUNTER — Ambulatory Visit (INDEPENDENT_AMBULATORY_CARE_PROVIDER_SITE_OTHER): Payer: 59 | Admitting: Sports Medicine

## 2020-11-30 DIAGNOSIS — M1811 Unilateral primary osteoarthritis of first carpometacarpal joint, right hand: Secondary | ICD-10-CM

## 2020-11-30 NOTE — Progress Notes (Signed)
    Procedures performed today:    Procedure: Real-time Ultrasound Guided injection of the right first [facility] Device: Samsung HS60  Verbal informed consent obtained.  Time-out conducted.  Noted no overlying erythema, induration, or other signs of local infection.  Skin prepped in a sterile fashion.  Local anesthesia: Topical Ethyl chloride.  With sterile technique and under real time ultrasound guidance:  Noted arthritic CMC, 1/2 cc lidocaine, 1/2 cc kenalog 40 injected easily.   Completed without difficulty  Advised to call if fevers/chills, erythema, induration, drainage, or persistent bleeding.  Images permanently stored and available for review in PACS.  Impression: Technically successful ultrasound guided injection.  Independent interpretation of notes and tests performed by another provider:   None.  Brief History, Exam, Impression, and Recommendations:    Primary osteoarthritis of first carpometacarpal joint of right hand Recurrence of pain in the right first [facility], last injection was September 2021. Repeated today, return to see me as needed.    ___________________________________________ Gwen Her. Dianah Field, M.D., ABFM., CAQSM. Primary Care and [place] Instructor of [place] of [facility] of Medicine

## 2020-11-30 NOTE — Assessment & Plan Note (Signed)
Recurrence of pain in the right first [facility], last injection was September 2021. Repeated today, return to see me as needed.

## 2021-01-09 ENCOUNTER — Other Ambulatory Visit: Payer: Self-pay | Admitting: Physician Assistant

## 2021-01-22 ENCOUNTER — Other Ambulatory Visit: Payer: Self-pay | Admitting: Physician Assistant

## 2021-02-23 ENCOUNTER — Other Ambulatory Visit: Payer: Self-pay | Admitting: Obstetrics & Gynecology

## 2021-02-23 DIAGNOSIS — Z01419 Encounter for gynecological examination (general) (routine) without abnormal findings: Secondary | ICD-10-CM

## 2021-03-09 ENCOUNTER — Other Ambulatory Visit: Payer: Self-pay

## 2021-03-09 ENCOUNTER — Encounter: Payer: Self-pay | Admitting: Physician Assistant

## 2021-03-09 ENCOUNTER — Ambulatory Visit (INDEPENDENT_AMBULATORY_CARE_PROVIDER_SITE_OTHER): Payer: 59 | Admitting: Physician Assistant

## 2021-03-09 VITALS — BP 148/76 | HR 97 | Temp 98.7°F | Ht 64.25 in | Wt 223.0 lb

## 2021-03-09 DIAGNOSIS — J4520 Mild intermittent asthma, uncomplicated: Secondary | ICD-10-CM

## 2021-03-09 DIAGNOSIS — R5383 Other fatigue: Secondary | ICD-10-CM | POA: Diagnosis not present

## 2021-03-09 DIAGNOSIS — G478 Other sleep disorders: Secondary | ICD-10-CM

## 2021-03-09 DIAGNOSIS — S29012A Strain of muscle and tendon of back wall of thorax, initial encounter: Secondary | ICD-10-CM | POA: Diagnosis not present

## 2021-03-09 DIAGNOSIS — Z6837 Body mass index (BMI) 37.0-37.9, adult: Secondary | ICD-10-CM

## 2021-03-09 DIAGNOSIS — Z131 Encounter for screening for diabetes mellitus: Secondary | ICD-10-CM

## 2021-03-09 DIAGNOSIS — Z13220 Encounter for screening for lipoid disorders: Secondary | ICD-10-CM

## 2021-03-09 DIAGNOSIS — R0683 Snoring: Secondary | ICD-10-CM

## 2021-03-09 DIAGNOSIS — Z1231 Encounter for screening mammogram for malignant neoplasm of breast: Secondary | ICD-10-CM

## 2021-03-09 DIAGNOSIS — G894 Chronic pain syndrome: Secondary | ICD-10-CM

## 2021-03-09 DIAGNOSIS — F331 Major depressive disorder, recurrent, moderate: Secondary | ICD-10-CM

## 2021-03-09 DIAGNOSIS — E6609 Other obesity due to excess calories: Secondary | ICD-10-CM

## 2021-03-09 MED ORDER — GABAPENTIN 100 MG PO CAPS: ORAL_CAPSULE | ORAL | 0 refills | Status: DC

## 2021-03-09 MED ORDER — ALBUTEROL SULFATE HFA 108 (90 BASE) MCG/ACT IN AERS
2.0000 | INHALATION_SPRAY | Freq: Four times a day (QID) | RESPIRATORY_TRACT | 0 refills | Status: DC | PRN
Start: 2021-03-09 — End: 2022-07-23

## 2021-03-09 MED ORDER — ALBUTEROL SULFATE HFA 108 (90 BASE) MCG/ACT IN AERS: 1.0000 | INHALATION_SPRAY | Freq: Four times a day (QID) | RESPIRATORY_TRACT | 1 refills | Status: DC | PRN

## 2021-03-09 MED ORDER — VENLAFAXINE HCL ER 150 MG PO CP24: 150.0000 mg | ORAL_CAPSULE | Freq: Every day | ORAL | 1 refills | Status: DC

## 2021-03-09 NOTE — Patient Instructions (Addendum)
Sleep study  Claritin/zyrtec  Gabapentin/lyrica

## 2021-03-09 NOTE — Progress Notes (Signed)
Subjective:    Patient ID: Tracy Landry, female    DOB: 01-Jul-1961, 60 y.o.   MRN: [id number]  HPI Pt is a 60 yo obese female with PMH of HTN, Asthma, GERD, chronic back pain with chronic OA pain, MDD, Anxiety who presents to the clinic to discuss fatigue. Her partner is here with her.   Her partner is very worried about her.  She has had a  Years.  She has had a total knee replacement with removal of the kneecap of her left knee.  She has chronic pain associated with her knee and into her foot with a Dupuytren's contracture.  She is not able to be nonweightbearing for 6 weeks and thus unable to have surgery on her foot.  She continues to have her chronic low back pain now more right upper back pain.  She thought it was getting better and she moved the wrong way and got worse again.  She is always tired.  Since she sits down she falls asleep.  She is not getting good restorative sleep.  She is snoring.  Her mood is changing.  She is having more panic attacks.  Her job is very stressful. Her temp will rise at night from 97 to 99. Pt does have known asthma. Not using inhalers. Not taking her anti-histamines either.   .. Active Ambulatory Problems    Diagnosis Date Noted   HYPERLIPIDEMIA 08/22/2006   Class 2 obesity due to excess calories without serious comorbidity with body mass index (BMI) of 37.0 to 37.9 in adult 06/01/2006   ASTHMA, PERSISTENT, MILD 08/12/2006   GASTROESOPHAGEAL REFLUX, NO ESOPHAGITIS 05/13/2006   ANKYLOSING SPONDYLITIS 01/06/2007   OSTEOPENIA 05/13/2006   COUGH 05/15/2010   SVT (supraventricular tachycardia) ([place]) 04/15/2012   Fatigue 04/15/2012   Essential hypertension 07/15/2012   Primary osteoarthritis, right shoulder 02/03/2014   Plantar fasciitis, left 06/09/2014   Anxiety state 03/28/2015   Low back pain 02/22/2016   Primary osteoarthritis of right knee, status post left total knee arthroplasty/revision patellectomy 05/08/2016   Periscapular pain  06/03/2016   Depression 03/15/2011   Primary osteoarthritis of first carpometacarpal joint of right hand 03/26/2018   Acute medial meniscus tear of left knee 03/26/2019   Arthritis of knee, left 03/15/2019   Asthma 08/12/2006   Dupuytren's disease 09/30/2018   Family history of diabetes mellitus in mother 04/01/2019   Impaired fasting glucose 04/02/2019   S/P TKR (total knee replacement) using cement, left 05/07/2019   Strain of rhomboid muscle 03/12/2021   Moderate episode of recurrent major depressive disorder ([place]) 03/12/2021   Chronic pain syndrome 03/12/2021   Non-restorative sleep 03/12/2021   Snoring 03/12/2021   Resolved Ambulatory Problems    Diagnosis Date Noted   VIRAL URI 07/14/2007   RHINITIS, ALLERGIC 05/13/2006   Palpitations 08/03/2007   CHEST PAIN, RIGHT 05/15/2010   Essential hypertension, malignant 04/15/2012   Thrush of mouth and esophagus ([place]) 03/26/2013   Peroneal tendinitis of left lower leg 04/01/2014   Left tibialis posterior tendonitis 08/08/2014   Puncture wound 09/13/2015   Right wrist pain 09/26/2016   Closed Colles' fracture of right radius 10/30/2015   Past Medical History:  Diagnosis Date   Back pain    GERD (gastroesophageal reflux disease)    Hypertension    Wears glasses      Review of Systems See HPI.     Objective:   Physical Exam Vitals reviewed.  Constitutional:      Appearance: Normal appearance. She  is obese.  HENT:     Head: Normocephalic.  Cardiovascular:     Rate and Rhythm: Normal rate and regular rhythm.     Pulses: Normal pulses.     Heart sounds: Normal heart sounds.  Pulmonary:     Effort: Pulmonary effort is normal.     Breath sounds: Normal breath sounds.  Musculoskeletal:     Comments: Duptreyns contracture of left foot.   Lymphadenopathy:     Cervical: No cervical adenopathy.  Neurological:     General: No focal deficit present.     Mental Status: She is alert and oriented to person, place, and time.   Psychiatric:     Comments: Flat affect.         Assessment & Plan:  Tracy Landry KitchenMarland KitchenBetteann was seen today for fatigue.  Diagnoses and all orders for this visit:  Fatigue, unspecified type -     Lipid Panel w/reflex Direct LDL -     COMPLETE METABOLIC PANEL WITH GFR -     VITAMIN D 25 Hydroxy (Vit-D Deficiency, Fractures) -     B12 and Folate Panel -     TSH -     CBC w/Diff/Platelet -     Home sleep test  Strain of rhomboid muscle, initial encounter  Screening for diabetes mellitus -     COMPLETE METABOLIC PANEL WITH GFR  Screening for lipid disorders -     Lipid Panel w/reflex Direct LDL  Class 2 obesity due to excess calories without serious comorbidity with body mass index (BMI) of 37.0 to 37.9 in adult -     Home sleep test  Moderate episode of recurrent major depressive disorder (HCC) -     venlafaxine XR (EFFEXOR XR) 150 MG 24 hr capsule; Take 1 capsule (150 mg total) by mouth daily with breakfast.  Chronic pain syndrome -     gabapentin (NEURONTIN) 100 MG capsule; Take 1-3 tablets up to three times a day. -     venlafaxine XR (EFFEXOR XR) 150 MG 24 hr capsule; Take 1 capsule (150 mg total) by mouth daily with breakfast.  Non-restorative sleep -     Home sleep test  Snoring -     Home sleep test  Mild intermittent asthma, unspecified whether complicated -     albuterol (VENTOLIN HFA) 108 (90 Base) MCG/ACT inhaler; Inhale 2 puffs into the lungs every 6 (six) hours as needed. -     albuterol (VENTOLIN HFA) 108 (90 Base) MCG/ACT inhaler; Inhale 1-2 puffs into the lungs every 6 (six) hours as needed for wheezing or shortness of breath.  I suspect multiple reasons for fatigue.  Will get labs.  Pain is a big factor. Increased effexor. Start gabapentin.  Will get sleep study.  Given rhomboid exercises. Consider ice, icy hot patches, tens unit. PT if not improving.  Refilled albuterol inhalers. Start claritin/zyrtec daily.   Follow up in 1 month.    Spent 30 minutes with  patient reviewing chart and discussing plan.

## 2021-03-12 DIAGNOSIS — R0683 Snoring: Secondary | ICD-10-CM | POA: Insufficient documentation

## 2021-03-12 DIAGNOSIS — G894 Chronic pain syndrome: Secondary | ICD-10-CM | POA: Insufficient documentation

## 2021-03-12 DIAGNOSIS — S29012A Strain of muscle and tendon of back wall of thorax, initial encounter: Secondary | ICD-10-CM | POA: Insufficient documentation

## 2021-03-12 DIAGNOSIS — G478 Other sleep disorders: Secondary | ICD-10-CM | POA: Insufficient documentation

## 2021-03-12 DIAGNOSIS — F331 Major depressive disorder, recurrent, moderate: Secondary | ICD-10-CM | POA: Insufficient documentation

## 2021-03-16 LAB — CBC WITH DIFFERENTIAL/PLATELET
Absolute Monocytes: 442 cells/uL (ref 200–950)
Basophils Absolute: 27 cells/uL (ref 0–200)
Basophils Relative: 0.4 %
Eosinophils Absolute: 129 cells/uL (ref 15–500)
Eosinophils Relative: 1.9 %
HCT: 39.6 % (ref 35.0–45.0)
Hemoglobin: 13 g/dL (ref 11.7–15.5)
Lymphs Abs: 2652 cells/uL (ref 850–3900)
MCH: 30 pg (ref 27.0–33.0)
MCHC: 32.8 g/dL (ref 32.0–36.0)
MCV: 91.5 fL (ref 80.0–100.0)
MPV: 10.6 fL (ref 7.5–12.5)
Monocytes Relative: 6.5 %
Neutro Abs: 3550 cells/uL (ref 1500–7800)
Neutrophils Relative %: 52.2 %
Platelets: 373 10*3/uL (ref 140–400)
RBC: 4.33 10*6/uL (ref 3.80–5.10)
RDW: 13.1 % (ref 11.0–15.0)
Total Lymphocyte: 39 %
WBC: 6.8 10*3/uL (ref 3.8–10.8)

## 2021-03-16 LAB — COMPLETE METABOLIC PANEL WITH GFR
AG Ratio: 1.5 (calc) (ref 1.0–2.5)
ALT: 17 U/L (ref 6–29)
AST: 15 U/L (ref 10–35)
Albumin: 4.3 g/dL (ref 3.6–5.1)
Alkaline phosphatase (APISO): 73 U/L (ref 37–153)
BUN: 14 mg/dL (ref 7–25)
CO2: 28 mmol/L (ref 20–32)
Calcium: 9.4 mg/dL (ref 8.6–10.4)
Chloride: 104 mmol/L (ref 98–110)
Creat: 0.69 mg/dL (ref 0.50–1.03)
Globulin: 2.9 g/dL (calc) (ref 1.9–3.7)
Glucose, Bld: 95 mg/dL (ref 65–99)
Potassium: 4.5 mmol/L (ref 3.5–5.3)
Sodium: 141 mmol/L (ref 135–146)
Total Bilirubin: 0.4 mg/dL (ref 0.2–1.2)
Total Protein: 7.2 g/dL (ref 6.1–8.1)
eGFR: 101 mL/min/{1.73_m2} (ref >=60)

## 2021-03-16 LAB — LIPID PANEL W/REFLEX DIRECT LDL
Cholesterol: 229 mg/dL — ABNORMAL HIGH (ref <200)
HDL: 67 mg/dL (ref >=50)
LDL Cholesterol (Calc): 141 mg/dL (calc) — ABNORMAL HIGH
Non-HDL Cholesterol (Calc): 162 mg/dL (calc) — ABNORMAL HIGH (ref <130)
Total CHOL/HDL Ratio: 3.4 (calc) (ref <5.0)
Triglycerides: 99 mg/dL (ref <150)

## 2021-03-16 LAB — VITAMIN D 25 HYDROXY (VIT D DEFICIENCY, FRACTURES): Vit D, 25-Hydroxy: 35 ng/mL (ref 30–100)

## 2021-03-16 LAB — TSH: TSH: 1.68 mIU/L (ref 0.40–4.50)

## 2021-03-16 LAB — B12 AND FOLATE PANEL
Folate: 9 ng/mL
Vitamin B-12: 481 pg/mL (ref 200–1100)

## 2021-03-19 NOTE — Progress Notes (Signed)
Tracy Landry,   Hemoglobin normal.  White blood count great.  Thyroid fantastic.  B12 and folate look good.  Vitamin D normal range but low normal. Make sure taking at least 2000 units a day of D3.  Kidney, liver, glucose look great.  HDL, good cholesterol, looks amazing! LDL, bad cholesterol, not to goal. Due to age consider low dose statin to help keep cardiovascular risk low.

## 2021-04-10 ENCOUNTER — Ambulatory Visit (INDEPENDENT_AMBULATORY_CARE_PROVIDER_SITE_OTHER): Payer: 59 | Admitting: Physician Assistant

## 2021-04-10 ENCOUNTER — Other Ambulatory Visit: Payer: Self-pay

## 2021-04-10 VITALS — BP 140/82 | HR 92 | Ht 64.25 in | Wt 227.0 lb

## 2021-04-10 DIAGNOSIS — F331 Major depressive disorder, recurrent, moderate: Secondary | ICD-10-CM

## 2021-04-10 DIAGNOSIS — Z23 Encounter for immunization: Secondary | ICD-10-CM

## 2021-04-10 DIAGNOSIS — Z01419 Encounter for gynecological examination (general) (routine) without abnormal findings: Secondary | ICD-10-CM

## 2021-04-10 DIAGNOSIS — R5383 Other fatigue: Secondary | ICD-10-CM | POA: Diagnosis not present

## 2021-04-10 DIAGNOSIS — F419 Anxiety disorder, unspecified: Secondary | ICD-10-CM

## 2021-04-10 MED ORDER — VENLAFAXINE HCL ER 37.5 MG PO CP24: 37.5000 mg | ORAL_CAPSULE | Freq: Every day | ORAL | 1 refills | Status: DC

## 2021-04-10 MED ORDER — VENLAFAXINE HCL ER 75 MG PO CP24: 75.0000 mg | ORAL_CAPSULE | Freq: Every day | ORAL | 0 refills | Status: DC

## 2021-04-10 NOTE — Progress Notes (Signed)
Subjective:    Patient ID: Tracy Landry, female    DOB: 01/13/1961, 60 y.o.   MRN: [id number]  HPI Pt is a 60 yo female who presents to the clinic to follow up on anxiety, depression and fatigue. She is doing better over the last month. She was not called about her sleep study. She did start gabapentin which is helping with her all over pain. She did increase effexor by 37.5 and added to '75mg'$ . She has more energy and less pain.   .. Active Ambulatory Problems    Diagnosis Date Noted   HYPERLIPIDEMIA 08/22/2006   Class 2 obesity due to excess calories without serious comorbidity with body mass index (BMI) of 37.0 to 37.9 in adult 06/01/2006   ASTHMA, PERSISTENT, MILD 08/12/2006   GASTROESOPHAGEAL REFLUX, NO ESOPHAGITIS 05/13/2006   ANKYLOSING SPONDYLITIS 01/06/2007   OSTEOPENIA 05/13/2006   COUGH 05/15/2010   SVT (supraventricular tachycardia) ([place]) 04/15/2012   Fatigue 04/15/2012   Essential hypertension 07/15/2012   Primary osteoarthritis, right shoulder 02/03/2014   Plantar fasciitis, left 06/09/2014   Anxiety 03/28/2015   Low back pain 02/22/2016   Primary osteoarthritis of right knee, status post left total knee arthroplasty/revision patellectomy 05/08/2016   Periscapular pain 06/03/2016   Depression 03/15/2011   Primary osteoarthritis of first carpometacarpal joint of right hand 03/26/2018   Acute medial meniscus tear of left knee 03/26/2019   Arthritis of knee, left 03/15/2019   Asthma 08/12/2006   Dupuytren's disease 09/30/2018   Family history of diabetes mellitus in mother 04/01/2019   Impaired fasting glucose 04/02/2019   S/P TKR (total knee replacement) using cement, left 05/07/2019   Strain of rhomboid muscle 03/12/2021   Moderate episode of recurrent major depressive disorder ([place]) 03/12/2021   Chronic pain syndrome 03/12/2021   Non-restorative sleep 03/12/2021   Snoring 03/12/2021   Resolved Ambulatory Problems    Diagnosis Date Noted   VIRAL URI  07/14/2007   RHINITIS, ALLERGIC 05/13/2006   Palpitations 08/03/2007   CHEST PAIN, RIGHT 05/15/2010   Essential hypertension, malignant 04/15/2012   Thrush of mouth and esophagus ([place]) 03/26/2013   Peroneal tendinitis of left lower leg 04/01/2014   Left tibialis posterior tendonitis 08/08/2014   Puncture wound 09/13/2015   Right wrist pain 09/26/2016   Closed Colles' fracture of right radius 10/30/2015   Past Medical History:  Diagnosis Date   Back pain    GERD (gastroesophageal reflux disease)    Hypertension    Wears glasses        Review of Systems See HPI.     Objective:   Physical Exam Vitals reviewed.  Constitutional:      Appearance: Normal appearance. She is obese.  HENT:     Head: Normocephalic.  Cardiovascular:     Rate and Rhythm: Normal rate and regular rhythm.     Heart sounds: Normal heart sounds.  Pulmonary:     Effort: Pulmonary effort is normal.     Breath sounds: Normal breath sounds.  Musculoskeletal:     Right lower leg: No edema.     Left lower leg: No edema.  Neurological:     General: No focal deficit present.     Mental Status: She is alert and oriented to person, place, and time.  Psychiatric:        Mood and Affect: Mood normal.      .. Depression screen [facility] 2/9 04/10/2021  Decreased Interest 0  Down, Depressed, Hopeless 0  PHQ - 2 Score 0  Altered sleeping 1  Tired, decreased energy 1  Change in appetite 0  Feeling bad or failure about yourself  0  Trouble concentrating 0  Moving slowly or fidgety/restless 0  Suicidal thoughts 0  PHQ-9 Score 2  Difficult doing work/chores Not difficult at all  Some recent data might be hidden   .Marland Kitchen GAD 7 : Generalized Anxiety Score 04/10/2021  Nervous, Anxious, on Edge 1  Control/stop worrying 1  Worry too much - different things 1  Trouble relaxing 1  Restless 0  Easily annoyed or irritable 0  Afraid - awful might happen 0  Total GAD 7 Score 4  Anxiety Difficulty Not difficult at all         Assessment & Plan:  Marland KitchenMarland KitchenTamiya was seen today for follow-up.  Diagnoses and all orders for this visit:  Moderate episode of recurrent major depressive disorder (HCC) -     venlafaxine XR (EFFEXOR XR) 37.5 MG 24 hr capsule; Take 1 capsule (37.5 mg total) by mouth daily with breakfast. -     venlafaxine XR (EFFEXOR-XR) 75 MG 24 hr capsule; Take 1 capsule (75 mg total) by mouth daily.  Flu vaccine need -     Flu Vaccine QUAD 78moIM (Fluarix, Fluzone & Alfiuria Quad PF)  Fatigue, unspecified type  Anxiety -     venlafaxine XR (EFFEXOR XR) 37.5 MG 24 hr capsule; Take 1 capsule (37.5 mg total) by mouth daily with breakfast. -     venlafaxine XR (EFFEXOR-XR) 75 MG 24 hr capsule; Take 1 capsule (75 mg total) by mouth daily.  Keep effexor dose the same at 75 plus 37.5.  Will see why sleep study was not scheduled yet.  Continue gabapentin for pain and anxiety.  Helping pain could be helping mood which could be helping sleep and thus fatigue.  Follow up in 3 months.

## 2021-04-12 ENCOUNTER — Other Ambulatory Visit: Payer: Self-pay | Admitting: Physician Assistant

## 2021-04-16 ENCOUNTER — Encounter: Payer: Self-pay | Admitting: Physician Assistant

## 2021-04-21 ENCOUNTER — Other Ambulatory Visit: Payer: Self-pay | Admitting: Physician Assistant

## 2021-05-12 ENCOUNTER — Other Ambulatory Visit: Payer: Self-pay | Admitting: Physician Assistant

## 2021-06-12 ENCOUNTER — Encounter: Payer: Self-pay | Admitting: Emergency Medicine

## 2021-06-12 ENCOUNTER — Other Ambulatory Visit: Payer: Self-pay

## 2021-06-12 ENCOUNTER — Emergency Department
Admission: EM | Admit: 2021-06-12 | Discharge: 2021-06-12 | Disposition: A | Discharge status: Home / Self Care | Payer: 59 | Source: Home / Self Care | Attending: Family Medicine | Admitting: Family Medicine

## 2021-06-12 DIAGNOSIS — J209 Acute bronchitis, unspecified: Secondary | ICD-10-CM

## 2021-06-12 DIAGNOSIS — J4521 Mild intermittent asthma with (acute) exacerbation: Secondary | ICD-10-CM

## 2021-06-12 MED ORDER — PREDNISONE 20 MG PO TABS: 20.0000 mg | ORAL_TABLET | Freq: Two times a day (BID) | ORAL | 0 refills | Status: DC

## 2021-06-12 MED ORDER — DOXYCYCLINE HYCLATE 100 MG PO CAPS: 100.0000 mg | ORAL_CAPSULE | Freq: Two times a day (BID) | ORAL | 0 refills | Status: DC

## 2021-06-12 MED ORDER — BENZONATATE 200 MG PO CAPS: 200.0000 mg | ORAL_CAPSULE | Freq: Three times a day (TID) | ORAL | 0 refills | Status: DC | PRN

## 2021-06-12 NOTE — ED Provider Notes (Signed)
[facility]    CSN: [id number] Arrival date & time: 06/12/21  6546      History   Chief Complaint Chief Complaint  Patient presents with   Cough    HPI Tracy Landry is a 60 y.o. female.   HPI  Patient has asthma.  She has an upper respiratory infection.  She has been coughing for 5 to 6 days.  She is not getting better.  She thinks she has bronchitis.  She states because of her asthma she usually needs antibiotics in order to get over an infection.  She has used her inhaler more often.  No exposure to flu or COVID.  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   Wears glasses     Patient Active Problem List   Diagnosis Date Noted   Strain of rhomboid muscle 03/12/2021   Moderate episode of recurrent major depressive disorder ([place]) 03/12/2021   Chronic pain syndrome 03/12/2021   Non-restorative sleep 03/12/2021   Snoring 03/12/2021   S/P TKR (total knee replacement) using cement, left 05/07/2019   Impaired fasting glucose 04/02/2019   Family history of diabetes mellitus in mother 04/01/2019   Acute medial meniscus tear of left knee 03/26/2019   Arthritis of knee, left 03/15/2019   Dupuytren's disease 09/30/2018   Primary osteoarthritis of first carpometacarpal joint of right hand 03/26/2018   Periscapular pain 06/03/2016   Primary osteoarthritis of right knee, status post left total knee arthroplasty/revision patellectomy 05/08/2016   Low back pain 02/22/2016   Anxiety 03/28/2015   Plantar fasciitis, left 06/09/2014   Primary osteoarthritis, right shoulder 02/03/2014   Essential hypertension 07/15/2012   SVT (supraventricular tachycardia) ([place]) 04/15/2012   Fatigue 04/15/2012   Depression 03/15/2011   COUGH 05/15/2010   ANKYLOSING SPONDYLITIS 01/06/2007    HYPERLIPIDEMIA 08/22/2006   ASTHMA, PERSISTENT, MILD 08/12/2006   Asthma 08/12/2006   Class 2 obesity due to excess calories without serious comorbidity with body mass index (BMI) of 37.0 to 37.9 in adult 06/01/2006   GASTROESOPHAGEAL REFLUX, NO ESOPHAGITIS 05/13/2006   OSTEOPENIA 05/13/2006    Past Surgical History:  Procedure Laterality Date   ABDOMINAL HYSTERECTOMY  3/09   ABLATION OF DYSRHYTHMIC FOCUS  10/07/2012   SVT   APPENDECTOMY     FOOT SURGERY  2000   rt foot fx   HARDWARE REMOVAL  2003   rt arm   MASS EXCISION Left 07/07/2013   Procedure: EXCISION MASS LEFT RING FINGER;  Surgeon: Wynonia Sours, MD;  Location: [facility];  Service: Orthopedics;  Laterality: Left;   PATELLECTOMY     REPAIR EXTENSOR TENDON Left 07/07/2013   Procedure: REPAIR EXTENSOR TENDON LEFT WITH DEBRIDMENT PROXIMAL INTERPHALANGEAL JOINT;  Surgeon: Wynonia Sours, MD;  Location: [place];  Service: Orthopedics;  Laterality: Left;   right arm reconstruction  1/01   SUPRAVENTRICULAR TACHYCARDIA ABLATION N/A 10/07/2012   Procedure: SUPRAVENTRICULAR TACHYCARDIA ABLATION;  Surgeon: Evans Lance, MD;  Location: [facility] CATH LAB;  Service: Cardiovascular;  Laterality: N/A;   TOTAL KNEE ARTHROPLASTY      OB History     Gravida  0   Para  0   Term  0   Preterm  0   AB  0  Living  0      SAB  0   IAB  0   Ectopic  0   Multiple  0   Live Births  0            Home Medications    Prior to Admission medications   Medication Sig Start Date End Date Taking? Authorizing Provider  albuterol (VENTOLIN HFA) 108 (90 Base) MCG/ACT inhaler Inhale 2 puffs into the lungs every 6 (six) hours as needed. 03/09/21  Yes Breeback, Jade L, PA-C  albuterol (VENTOLIN HFA) 108 (90 Base) MCG/ACT inhaler Inhale 1-2 puffs into the lungs every 6 (six) hours as needed for wheezing or shortness of breath. 03/09/21  Yes Breeback, Jade L, PA-C  benzonatate (TESSALON) 200 MG capsule Take 1  capsule (200 mg total) by mouth 3 (three) times daily as needed for cough. 06/12/21  Yes Raylene Everts, MD  cyclobenzaprine (FLEXERIL) 10 MG tablet TAKE 1 TABLET BY MOUTH AT BEDTIME 05/14/21  Yes Breeback, Jade L, PA-C  diclofenac (VOLTAREN) 75 MG EC tablet TAKE 1 TABLET BY MOUTH ONCE DAILY AS NEEDED 04/12/21  Yes Breeback, Jade L, PA-C  doxycycline (VIBRAMYCIN) 100 MG capsule Take 1 capsule (100 mg total) by mouth 2 (two) times daily. 06/12/21  Yes Raylene Everts, MD  esomeprazole (NEXIUM) 20 MG capsule Take by mouth.   Yes [provider]  gabapentin (NEURONTIN) 100 MG capsule Take 1-3 tablets up to three times a day. 03/09/21  Yes Breeback, Jade L, PA-C  montelukast (SINGULAIR) 10 MG tablet Take 1 tablet (10 mg total) by mouth at bedtime. 07/10/20  Yes Breeback, Jade L, PA-C  pravastatin (PRAVACHOL) 20 MG tablet Take 1 tablet (20 mg total) by mouth daily. 04/23/21  Yes Breeback, Jade L, PA-C  predniSONE (DELTASONE) 20 MG tablet Take 1 tablet (20 mg total) by mouth 2 (two) times daily with a meal. 06/12/21  Yes Raylene Everts, MD  venlafaxine XR (EFFEXOR XR) 37.5 MG 24 hr capsule Take 1 capsule (37.5 mg total) by mouth daily with breakfast. 04/10/21  Yes Breeback, Jade L, PA-C  venlafaxine XR (EFFEXOR-XR) 75 MG 24 hr capsule Take 1 capsule (75 mg total) by mouth daily. 04/10/21  Yes Breeback, Royetta Car, PA-C    Family History Family History  Problem Relation Age of Onset   Alcohol abuse Mother    Diabetes Mother    Hyperlipidemia Mother    Hypertension Mother    Heart disease Mother        Rheumatic disease   Alzheimer's disease Father    Diabetes Sister    Hyperlipidemia Sister    Hypertension Sister    Heart attack Maternal Uncle    Lung cancer Maternal Grandmother        lung   Heart attack Maternal Grandfather     Social History Social History   Tobacco Use   Smoking status: Never   Smokeless tobacco: Never  Substance Use Topics   Alcohol use: Yes    Comment: 3 a  week   Drug use: No     Allergies   Chlorhexidine gluconate, Amoxicillin-pot clavulanate, and Codeine sulfate   Review of Systems Review of Systems See HPI  Physical Exam Triage Vital Signs ED Triage Vitals  Enc Vitals Group     BP 06/12/21 1036 (!) 183/91     Pulse Rate 06/12/21 1036 74     Resp 06/12/21 1036 18     Temp 06/12/21 1036 98.5 F (36.9 C)  Temp Source 06/12/21 1036 Oral     SpO2 06/12/21 1036 96 %     Weight --      Height --      Head Circumference --      Peak Flow --      Pain Score 06/12/21 1033 1     Pain Loc --      Pain Edu? --      Excl. in [place]? --    No data found.  Updated Vital Signs BP (!) 183/91 (BP Location: Right Arm)   Pulse 74   Temp 98.5 F (36.9 C) (Oral)   Resp 18   LMP 10/28/2006   SpO2 96%      Physical Exam Constitutional:      General: She is not in acute distress.    Appearance: She is well-developed. She is ill-appearing.     Comments: Overweight.  HENT:     Head: Normocephalic and atraumatic.     Right Ear: Tympanic membrane and ear canal normal.     Left Ear: Tympanic membrane and ear canal normal.     Nose: Nose normal. No rhinorrhea.     Mouth/Throat:     Pharynx: No posterior oropharyngeal erythema.  Eyes:     Conjunctiva/sclera: Conjunctivae normal.     Pupils: Pupils are equal, round, and reactive to light.  Cardiovascular:     Rate and Rhythm: Normal rate and regular rhythm.     Heart sounds: Normal heart sounds.  Pulmonary:     Effort: Pulmonary effort is normal. No respiratory distress.     Breath sounds: Rhonchi present.  Abdominal:     General: There is no distension.     Palpations: Abdomen is soft.  Musculoskeletal:        General: Normal range of motion.     Cervical back: Normal range of motion.  Skin:    General: Skin is warm and dry.  Neurological:     Mental Status: She is alert.  Psychiatric:        Mood and Affect: Mood normal.        Behavior: Behavior normal.     UC  Treatments / Results  Labs (all labs ordered are listed, but only abnormal results are displayed) Labs Reviewed - No data to display  EKG   Radiology No results found.  Procedures Procedures (including critical care time)  Medications Ordered in UC Medications - No data to display  Initial Impression / Assessment and Plan / UC Course  I have reviewed the triage vital signs and the nursing notes.  Pertinent labs & imaging results that were available during my care of the patient were reviewed by me and considered in my medical decision making (see chart for details).     Final Clinical Impressions(s) / UC Diagnoses   Final diagnoses:  Mild intermittent asthma with acute exacerbation  Acute bronchitis, unspecified organism     Discharge Instructions      Take doxycycline with food.  Take 2 times a day Take the prednisone 2 times.  Take this with food as well Use Tessalon 2-3 times a day as needed for cough May add Mucinex DM if needed in addition Drink lots of fluids See your doctor if not improving by next week   ED Prescriptions     Medication Sig Dispense Auth. Provider   benzonatate (TESSALON) 200 MG capsule Take 1 capsule (200 mg total) by mouth 3 (three) times daily as needed for cough.  21 capsule Raylene Everts, MD   predniSONE (DELTASONE) 20 MG tablet Take 1 tablet (20 mg total) by mouth 2 (two) times daily with a meal. 10 tablet Raylene Everts, MD   doxycycline (VIBRAMYCIN) 100 MG capsule Take 1 capsule (100 mg total) by mouth 2 (two) times daily. 20 capsule Raylene Everts, MD      PDMP not reviewed this encounter.   Raylene Everts, MD 06/12/21 [phone]

## 2021-06-12 NOTE — Discharge Instructions (Signed)
Take doxycycline with food.  Take 2 times a day Take the prednisone 2 times.  Take this with food as well Use Tessalon 2-3 times a day as needed for cough May add Mucinex DM if needed in addition Drink lots of fluids See your doctor if not improving by next week

## 2021-06-12 NOTE — ED Triage Notes (Signed)
Patient presents to Urgent Care with complaints of sinus pain, nasal congestion, cough since 5 days ago. Patient reports cough, postnasal drainage. Using inhaler. Denies fever or chills.  Taking Robitussin DM and Tylenol for symptoms.

## 2021-07-05 ENCOUNTER — Other Ambulatory Visit: Payer: Self-pay | Admitting: Physician Assistant

## 2021-07-05 DIAGNOSIS — G894 Chronic pain syndrome: Secondary | ICD-10-CM

## 2021-07-09 ENCOUNTER — Other Ambulatory Visit: Payer: Self-pay | Admitting: Physician Assistant

## 2021-07-17 ENCOUNTER — Other Ambulatory Visit: Payer: Self-pay

## 2021-07-17 ENCOUNTER — Ambulatory Visit (INDEPENDENT_AMBULATORY_CARE_PROVIDER_SITE_OTHER): Payer: 59 | Admitting: Sports Medicine

## 2021-07-17 ENCOUNTER — Ambulatory Visit (INDEPENDENT_AMBULATORY_CARE_PROVIDER_SITE_OTHER): Payer: 59

## 2021-07-17 DIAGNOSIS — M1811 Unilateral primary osteoarthritis of first carpometacarpal joint, right hand: Secondary | ICD-10-CM

## 2021-07-17 NOTE — Progress Notes (Signed)
° ° °  Procedures performed today:    Procedure: Real-time Ultrasound Guided injection of the right first [facility] Device: Samsung HS60  Verbal informed consent obtained.  Time-out conducted.  Noted no overlying erythema, induration, or other signs of local infection.  Skin prepped in a sterile fashion.  Local anesthesia: Topical Ethyl chloride.  With sterile technique and under real time ultrasound guidance:  Noted arthritic CMC, 1/2 cc lidocaine, 1/2 cc kenalog 40 injected easily.   Completed without difficulty  Advised to call if fevers/chills, erythema, induration, drainage, or persistent bleeding.  Images permanently stored and available for review in PACS.  Impression: Technically successful ultrasound guided injection.  Independent interpretation of notes and tests performed by another provider:   None.  Brief History, Exam, Impression, and Recommendations:    Primary osteoarthritis of first carpometacarpal joint of right hand Increasing pain right first CMC, chronic process with exacerbation, last injection was in April of this year, repeat for [facility] injection today, return to see me as needed.    ___________________________________________ Gwen Her. Dianah Field, M.D., ABFM., CAQSM. Primary Care and [place] Instructor of [place] [employer] [facility] [employer]

## 2021-07-17 NOTE — Assessment & Plan Note (Signed)
Increasing pain right first [place], chronic process with exacerbation, last injection was in April of this year, repeat for [facility] injection today, return to see me as needed.

## 2021-07-19 ENCOUNTER — Other Ambulatory Visit: Payer: Self-pay | Admitting: Physician Assistant

## 2021-07-20 ENCOUNTER — Other Ambulatory Visit: Payer: Self-pay | Admitting: Physician Assistant

## 2021-08-14 ENCOUNTER — Other Ambulatory Visit: Payer: Self-pay | Admitting: Physician Assistant

## 2021-10-09 ENCOUNTER — Other Ambulatory Visit: Payer: Self-pay | Admitting: Physician Assistant

## 2021-10-15 ENCOUNTER — Other Ambulatory Visit: Payer: Self-pay | Admitting: Physician Assistant

## 2021-10-15 DIAGNOSIS — G894 Chronic pain syndrome: Secondary | ICD-10-CM

## 2021-10-17 ENCOUNTER — Other Ambulatory Visit: Payer: Self-pay

## 2021-10-17 DIAGNOSIS — G894 Chronic pain syndrome: Secondary | ICD-10-CM

## 2021-10-17 MED ORDER — GABAPENTIN 100 MG PO CAPS: ORAL_CAPSULE | ORAL | 1 refills | Status: DC

## 2021-10-17 NOTE — Telephone Encounter (Signed)
Tracy Landry from [employer] states that [employer] will only pay for a maximum of 6 capsules daily of gabapentin.  They are requesting a new RX written for 1 to 2 capsules three times daily.  Pt stated to them that they only take 3 capsules daily.  Please send new RX if appropriate. ?

## 2021-10-29 ENCOUNTER — Other Ambulatory Visit: Payer: Self-pay | Admitting: Physician Assistant

## 2021-10-29 DIAGNOSIS — G894 Chronic pain syndrome: Secondary | ICD-10-CM

## 2021-10-29 DIAGNOSIS — F331 Major depressive disorder, recurrent, moderate: Secondary | ICD-10-CM

## 2021-10-30 ENCOUNTER — Encounter: Payer: Self-pay | Admitting: Physician Assistant

## 2021-10-31 MED ORDER — VENLAFAXINE HCL ER 150 MG PO CP24: 150.0000 mg | ORAL_CAPSULE | Freq: Every day | ORAL | 1 refills | Status: DC

## 2021-10-31 NOTE — Addendum Note (Signed)
Addended by: Narda Rutherford on: 10/31/2021 08:53 AM ? ? Modules accepted: Orders ? ?

## 2021-11-12 ENCOUNTER — Other Ambulatory Visit: Payer: Self-pay | Admitting: Physician Assistant

## 2021-11-27 ENCOUNTER — Ambulatory Visit (INDEPENDENT_AMBULATORY_CARE_PROVIDER_SITE_OTHER): Payer: 59 | Admitting: Physician Assistant

## 2021-11-27 ENCOUNTER — Encounter: Payer: Self-pay | Admitting: Physician Assistant

## 2021-11-27 VITALS — BP 130/74 | HR 83 | Ht 64.25 in | Wt 230.0 lb

## 2021-11-27 DIAGNOSIS — M898X1 Other specified disorders of bone, shoulder: Secondary | ICD-10-CM | POA: Diagnosis not present

## 2021-11-27 DIAGNOSIS — R69 Illness, unspecified: Secondary | ICD-10-CM | POA: Diagnosis not present

## 2021-11-27 DIAGNOSIS — M1711 Unilateral primary osteoarthritis, right knee: Secondary | ICD-10-CM

## 2021-11-27 DIAGNOSIS — F419 Anxiety disorder, unspecified: Secondary | ICD-10-CM

## 2021-11-27 DIAGNOSIS — I10 Essential (primary) hypertension: Secondary | ICD-10-CM | POA: Diagnosis not present

## 2021-11-27 DIAGNOSIS — F331 Major depressive disorder, recurrent, moderate: Secondary | ICD-10-CM | POA: Diagnosis not present

## 2021-11-27 DIAGNOSIS — Z6837 Body mass index (BMI) 37.0-37.9, adult: Secondary | ICD-10-CM | POA: Diagnosis not present

## 2021-11-27 MED ORDER — WEGOVY 0.25 MG/0.5ML ~~LOC~~ SOAJ
0.2500 mg | SUBCUTANEOUS | 0 refills | Status: DC
Start: 2021-11-27 — End: 2022-02-15

## 2021-11-27 MED ORDER — WEGOVY 1 MG/0.5ML ~~LOC~~ SOAJ: 1.0000 mg | SUBCUTANEOUS | 0 refills | Status: DC

## 2021-11-27 MED ORDER — CYCLOBENZAPRINE HCL 10 MG PO TABS: ORAL_TABLET | ORAL | 3 refills | Status: DC

## 2021-11-27 MED ORDER — DICLOFENAC SODIUM 75 MG PO TBEC: 75.0000 mg | DELAYED_RELEASE_TABLET | Freq: Every day | ORAL | 3 refills | Status: DC | PRN

## 2021-11-27 MED ORDER — WEGOVY 0.5 MG/0.5ML ~~LOC~~ SOAJ: 0.5000 mg | SUBCUTANEOUS | 0 refills | Status: DC

## 2021-11-28 ENCOUNTER — Telehealth: Payer: Self-pay

## 2021-11-28 NOTE — Telephone Encounter (Addendum)
Initiated Prior authorization [id number] (tiered strengths) ?Via: Covermymeds ?Case/Key: [id number] ?Status: denied  as of 11/28/21 ?Reason:*Drug Not Covered/Plan Exclusion - Your request for coverage was denied because ?your prescription benefit plan does not cover the requested medication. ?Notified Pt via: Mychart ? ?

## 2021-11-30 ENCOUNTER — Encounter: Payer: Self-pay | Admitting: Physician Assistant

## 2021-11-30 NOTE — Progress Notes (Signed)
? ?Established Patient Office Visit ? ?Subjective   ?Patient ID: ARIZONA SORN, female    DOB: [birth year]  Age: 61 y.o. MRN: [id number] ? ?Chief Complaint  ?Patient presents with  ? Follow-up  ? ? ?HPI ?Pt is a 61 yo obese female with HTN, HLD, Anxiety who needs refills.  ? ?She is doing well on effexor. No concerns or complaints. No SI/HC. She does have a very stressful job but handling ok.  ? ?She continues to have some aches and pains but diclofenac helps. Needs refills.  ? ? ? ?.. ?Active Ambulatory Problems  ?  Diagnosis Date Noted  ? HYPERLIPIDEMIA 08/22/2006  ? Class 2 severe obesity due to excess calories with serious comorbidity and body mass index (BMI) of 37.0 to 37.9 in adult [facility]) 06/01/2006  ? ASTHMA, PERSISTENT, MILD 08/12/2006  ? GASTROESOPHAGEAL REFLUX, NO ESOPHAGITIS 05/13/2006  ? ANKYLOSING SPONDYLITIS 01/06/2007  ? OSTEOPENIA 05/13/2006  ? COUGH 05/15/2010  ? SVT (supraventricular tachycardia) ([place]) 04/15/2012  ? Fatigue 04/15/2012  ? Essential hypertension 07/15/2012  ? Primary osteoarthritis, right shoulder 02/03/2014  ? Plantar fasciitis, left 06/09/2014  ? Anxiety 03/28/2015  ? Low back pain 02/22/2016  ? Primary osteoarthritis of right knee, status post left total knee arthroplasty/revision patellectomy 05/08/2016  ? Periscapular pain 06/03/2016  ? Depression 03/15/2011  ? Primary osteoarthritis of first carpometacarpal joint of right hand 03/26/2018  ? Acute medial meniscus tear of left knee 03/26/2019  ? Arthritis of knee, left 03/15/2019  ? Asthma 08/12/2006  ? Dupuytren's disease 09/30/2018  ? Family history of diabetes mellitus in mother 04/01/2019  ? Impaired fasting glucose 04/02/2019  ? S/P TKR (total knee replacement) using cement, left 05/07/2019  ? Strain of rhomboid muscle 03/12/2021  ? Moderate episode of recurrent major depressive disorder ([place]) 03/12/2021  ? Chronic pain syndrome 03/12/2021  ? Non-restorative sleep 03/12/2021  ? Snoring 03/12/2021  ? ?Resolved Ambulatory  Problems  ?  Diagnosis Date Noted  ? VIRAL URI 07/14/2007  ? RHINITIS, ALLERGIC 05/13/2006  ? Palpitations 08/03/2007  ? CHEST PAIN, RIGHT 05/15/2010  ? Essential hypertension, malignant 04/15/2012  ? Thrush of mouth and esophagus ([place]) 03/26/2013  ? Peroneal tendinitis of left lower leg 04/01/2014  ? Left tibialis posterior tendonitis 08/08/2014  ? Puncture wound 09/13/2015  ? Right wrist pain 09/26/2016  ? Closed Colles' fracture of right radius 10/30/2015  ? ?Past Medical History:  ?Diagnosis Date  ? Back pain   ? GERD (gastroesophageal reflux disease)   ? Hypertension   ? Wears glasses   ? ? ?Review of Systems  ?All other systems reviewed and are negative. ? ?  ?Objective:  ?  ? ?BP 130/74   Pulse 83   Ht 5' 4.25" (1.632 m)   Wt 230 lb (104.3 kg)   LMP 10/28/2006   SpO2 98%   BMI 39.17 kg/m?  ?BP Readings from Last 3 Encounters:  ?11/27/21 130/74  ?06/12/21 (!) 183/91  ?04/10/21 140/82  ? ?Wt Readings from Last 3 Encounters:  ?11/27/21 230 lb (104.3 kg)  ?04/10/21 227 lb (103 kg)  ?03/09/21 223 lb (101.2 kg)  ? ?  ? ?Physical Exam ?Vitals reviewed.  ?Constitutional:   ?   Appearance: Normal appearance. She is obese.  ?HENT:  ?   Head: Normocephalic.  ?Cardiovascular:  ?   Rate and Rhythm: Normal rate and regular rhythm.  ?   Pulses: Normal pulses.  ?   Heart sounds: Normal heart sounds.  ?Pulmonary:  ?  Effort: Pulmonary effort is normal.  ?Musculoskeletal:  ?   Right lower leg: No edema.  ?   Left lower leg: No edema.  ?Neurological:  ?   General: No focal deficit present.  ?   Mental Status: She is alert and oriented to person, place, and time.  ?Psychiatric:     ?   Mood and Affect: Mood normal.  ? ? ?.. ? ?  11/27/2021  ? 11:02 AM 04/10/2021  ?  1:19 PM  ?Depression screen PHQ 2/9  ?Decreased Interest 0 0  ?Down, Depressed, Hopeless 0 0  ?PHQ - 2 Score 0 0  ?Altered sleeping  1  ?Tired, decreased energy  1  ?Change in appetite  0  ?Feeling bad or failure about yourself   0  ?Trouble concentrating  0   ?Moving slowly or fidgety/restless  0  ?Suicidal thoughts  0  ?PHQ-9 Score  2  ?Difficult doing work/chores  Not difficult at all  ? ? ? ? ?  ?Assessment & Plan:  ?..Dama was seen today for follow-up. ? ?Diagnoses and all orders for this visit: ? ?Essential hypertension ? ?Class 2 severe obesity due to excess calories with serious comorbidity and body mass index (BMI) of 37.0 to 37.9 in adult [facility]) ?-     WEGOVY 0.25 MG/0.5ML SOAJ; Inject 0.25 mg into the skin once a week. Use this dose for 1 month (4 shots) and then increase to next higher dose. ?-     WEGOVY 0.5 MG/0.5ML SOAJ; Inject 0.5 mg into the skin once a week. Use this dose for 1 month (4 shots) and then increase to next higher dose. ?-     WEGOVY 1 MG/0.5ML SOAJ; Inject 1 mg into the skin once a week. Use this dose for 1 month (4 shots) and then increase to next higher dose. ? ?Moderate episode of recurrent major depressive disorder ([place]) ? ?Anxiety ? ?Primary osteoarthritis of right knee, status post left total knee arthroplasty/revision patellectomy ?-     diclofenac (VOLTAREN) 75 MG EC tablet; Take 1 tablet (75 mg total) by mouth daily as needed. ? ?Periscapular pain ?-     cyclobenzaprine (FLEXERIL) 10 MG tablet; TAKE 1 TABLET BY MOUTH AT BEDTIME ? ? ?Recheck BP better ?Continue to follow ? ?Marland Kitchen.Discussed low carb diet with 1500 calories and 80g of protein.  ?Exercising at least 150 minutes a week.  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  ?Start wegovy. Discussed side effects and titration up. ?Follow up in 3 months.  ? ?Refilled flexeril/voltaren for as needed musculoskeletal pain ? ?Return in about 3 months (around 02/26/2022) for BP recheck/3 months.  ? ? ?Iran Planas, PA-C ? ?

## 2021-12-21 ENCOUNTER — Other Ambulatory Visit: Payer: Self-pay | Admitting: Physician Assistant

## 2021-12-21 DIAGNOSIS — G894 Chronic pain syndrome: Secondary | ICD-10-CM

## 2022-01-15 DIAGNOSIS — M1711 Unilateral primary osteoarthritis, right knee: Secondary | ICD-10-CM | POA: Diagnosis not present

## 2022-01-15 DIAGNOSIS — M25561 Pain in right knee: Secondary | ICD-10-CM | POA: Diagnosis not present

## 2022-01-30 ENCOUNTER — Encounter: Payer: Self-pay | Admitting: Family Medicine

## 2022-01-30 ENCOUNTER — Encounter: Payer: Self-pay | Admitting: Sports Medicine

## 2022-01-30 ENCOUNTER — Ambulatory Visit: Payer: 59 | Admitting: Family Medicine

## 2022-01-30 ENCOUNTER — Ambulatory Visit: Payer: Self-pay

## 2022-01-30 VITALS — BP 140/88 | Ht 65.0 in | Wt 225.0 lb

## 2022-01-30 DIAGNOSIS — M84453A Pathological fracture, unspecified femur, initial encounter for fracture: Secondary | ICD-10-CM | POA: Diagnosis not present

## 2022-01-30 DIAGNOSIS — M25561 Pain in right knee: Secondary | ICD-10-CM

## 2022-01-30 DIAGNOSIS — M1711 Unilateral primary osteoarthritis, right knee: Secondary | ICD-10-CM

## 2022-01-30 DIAGNOSIS — E2839 Other primary ovarian failure: Secondary | ICD-10-CM | POA: Diagnosis not present

## 2022-01-30 NOTE — Patient Instructions (Signed)
Nice to meet you Please try the brace. Please start taking vitamin K2 and D3. I have made a referral to physical therapy. Please get the bone density obtained at [facility].  I put the order in. Please set up an appointment for custom orthotics made. Please send me a message in MyChart with any questions or updates.  We will call to set up an appointment once the gel injection is approved  --Dr. Raeford Razor

## 2022-01-30 NOTE — Assessment & Plan Note (Signed)
Acutely occurring.  Degenerative changes appreciated but no improvement with recent steroid injection.  Findings most consistent with an insufficiency fracture. -Counseled on home exercise therapy and supportive care. -Referral to physical therapy. -Can pursue custom orthotics. -Hinged knee brace.

## 2022-01-30 NOTE — Assessment & Plan Note (Signed)
Acute on chronic in nature.  History of a normal bone density but with changes observed on ultrasound today we will check again. -DEXA scan.

## 2022-01-30 NOTE — Assessment & Plan Note (Signed)
Acute on chronic in nature.  Degenerative changes appreciated.  No improvement with recent steroid injection.  X-rays demonstrating degenerative changes at the orthopedic office. -Counseled on home exercise therapy and supportive care. -Referral to physical therapy. -Pursue gel injections. -Could consider Zilretta or further imaging.

## 2022-01-30 NOTE — Progress Notes (Signed)
Tracy Landry - 61 y.o. female MRN [id number]  Date of birth: 11/01/1960  SUBJECTIVE:  Including CC & ROS.  No chief complaint on file.   Tracy Landry is a 61 y.o. female that is presenting with acute right knee pain.  The pain has been ongoing for the past few weeks.  Pain is occurring over the medial aspect.  Has received a steroid injection with limited improvement.  Recent x-ray showing degenerative changes..  Review of the office note from 4/25 shows she was provided diclofenac.   Review of Systems See HPI   HISTORY: Past Medical, Surgical, Social, and Family History Reviewed & Updated per EMR.   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   Wears glasses     Past Surgical History:  Procedure Laterality Date   ABDOMINAL HYSTERECTOMY  3/09   ABLATION OF DYSRHYTHMIC FOCUS  10/07/2012   SVT   APPENDECTOMY     FOOT SURGERY  2000   rt foot fx   HARDWARE REMOVAL  2003   rt arm   MASS EXCISION Left 07/07/2013   Procedure: EXCISION MASS LEFT RING FINGER;  Surgeon: Wynonia Sours, MD;  Location: [place];  Service: Orthopedics;  Laterality: Left;   PATELLECTOMY     REPAIR EXTENSOR TENDON Left 07/07/2013   Procedure: REPAIR EXTENSOR TENDON LEFT WITH DEBRIDMENT PROXIMAL INTERPHALANGEAL JOINT;  Surgeon: Wynonia Sours, MD;  Location: [place];  Service: Orthopedics;  Laterality: Left;   right arm reconstruction  1/01   SUPRAVENTRICULAR TACHYCARDIA ABLATION N/A 10/07/2012   Procedure: SUPRAVENTRICULAR TACHYCARDIA ABLATION;  Surgeon: Evans Lance, MD;  Location: [facility] CATH LAB;  Service: Cardiovascular;  Laterality: N/A;   TOTAL KNEE ARTHROPLASTY       PHYSICAL EXAM:  VS: BP 140/88 (BP Location: Left Arm, Patient Position: Sitting)   Ht '5\' 5"'$   (1.651 m)   Wt 225 lb (102.1 kg)   LMP 10/28/2006   BMI 37.44 kg/m  Physical Exam Gen: NAD, alert, cooperative with exam, well-appearing MSK:  Neurovascularly intact    Limited ultrasound: Right knee:  Mild to moderate effusion within the suprapatellar pouch. Calcific changes at the insertion of the quadricep tendon. Degenerative changes with medial joint space narrowing in the medial compartment. Significant increased hyperemia of the medial femoral condyle.  Summary: Osteoarthritis and insufficiency fracture of the medial femoral condyle  Ultrasound and interpretation by Clearance Coots, MD    ASSESSMENT & PLAN:   Estrogen deficiency Acute on chronic in nature.  History of a normal bone density but with changes observed on ultrasound today we will check again. -DEXA scan.  Insufficiency fracture of medial femoral condyle (HCC) Acutely occurring.  Degenerative changes appreciated but no improvement with recent steroid injection.  Findings most consistent with an insufficiency fracture. -Counseled on home exercise therapy and supportive care. -Referral to physical therapy. -Can pursue custom orthotics. -Hinged knee brace.  Primary osteoarthritis of right knee, status post left total knee arthroplasty/revision patellectomy Acute on chronic in nature.  Degenerative changes appreciated.  No improvement with recent steroid injection.  X-rays demonstrating degenerative changes at the orthopedic office. -Counseled on home exercise therapy and supportive care. -Referral to physical therapy. -Pursue gel injections. -Could consider Zilretta or further  imaging.

## 2022-01-31 ENCOUNTER — Other Ambulatory Visit: Payer: Self-pay | Admitting: Obstetrics & Gynecology

## 2022-01-31 DIAGNOSIS — Z1231 Encounter for screening mammogram for malignant neoplasm of breast: Secondary | ICD-10-CM

## 2022-02-04 ENCOUNTER — Telehealth: Payer: Self-pay | Admitting: Family Medicine

## 2022-02-04 NOTE — Telephone Encounter (Signed)
Submitted request for right knee gel injections to [facility] today.

## 2022-02-06 ENCOUNTER — Ambulatory Visit (INDEPENDENT_AMBULATORY_CARE_PROVIDER_SITE_OTHER): Payer: 59

## 2022-02-06 ENCOUNTER — Encounter: Payer: 59 | Admitting: Family Medicine

## 2022-02-06 DIAGNOSIS — Z780 Asymptomatic menopausal state: Secondary | ICD-10-CM | POA: Diagnosis not present

## 2022-02-06 DIAGNOSIS — E2839 Other primary ovarian failure: Secondary | ICD-10-CM

## 2022-02-07 ENCOUNTER — Ambulatory Visit (INDEPENDENT_AMBULATORY_CARE_PROVIDER_SITE_OTHER): Payer: 59

## 2022-02-07 DIAGNOSIS — Z1231 Encounter for screening mammogram for malignant neoplasm of breast: Secondary | ICD-10-CM | POA: Diagnosis not present

## 2022-02-08 ENCOUNTER — Telehealth: Payer: Self-pay | Admitting: Family Medicine

## 2022-02-08 ENCOUNTER — Ambulatory Visit: Payer: 59 | Admitting: Sports Medicine

## 2022-02-08 ENCOUNTER — Ambulatory Visit (INDEPENDENT_AMBULATORY_CARE_PROVIDER_SITE_OTHER): Payer: 59

## 2022-02-08 DIAGNOSIS — M1711 Unilateral primary osteoarthritis, right knee: Secondary | ICD-10-CM | POA: Diagnosis not present

## 2022-02-08 DIAGNOSIS — Z09 Encounter for follow-up examination after completed treatment for conditions other than malignant neoplasm: Secondary | ICD-10-CM | POA: Diagnosis not present

## 2022-02-08 DIAGNOSIS — M25461 Effusion, right knee: Secondary | ICD-10-CM | POA: Diagnosis not present

## 2022-02-08 DIAGNOSIS — Z96652 Presence of left artificial knee joint: Secondary | ICD-10-CM | POA: Diagnosis not present

## 2022-02-08 DIAGNOSIS — Z471 Aftercare following joint replacement surgery: Secondary | ICD-10-CM | POA: Diagnosis not present

## 2022-02-08 MED ORDER — TRAMADOL HCL 50 MG PO TABS: 50.0000 mg | ORAL_TABLET | Freq: Three times a day (TID) | ORAL | 0 refills | Status: DC | PRN

## 2022-02-08 NOTE — Assessment & Plan Note (Signed)
Status post left total knee arthroplasty with revision patellectomy, also with primary osteoarthritis of the right knee. Increasing right knee pain, medial joint line as well as medial femoral condyle. Did see Dr. Raeford Razor, she is also seen Dr. Berenice Primas and had an injection about 3 weeks ago. Unfortunately persistence of pain. Mild to moderate effusion, tenderness at distal femoral medial condyle. I do think we need to get better diagnosis here, updated x-rays, MRI for diagnosis of tibial subchondral insufficiency fracture. She will go nonweightbearing with crutches for now, and I will add tramadol as needed for pain relief. If we see a subchondral insufficiency fracture she will need to be nonweightbearing with crutches for prolonged period of time, if simple osteoarthritis we will consider to revisit viscosupplementation.

## 2022-02-08 NOTE — Telephone Encounter (Signed)
Left VM for patient. If she calls back please have her speak with a nurse/CMA and inform that her bone density is normal.   If any questions then please take the best time and phone number to call and I will try to call her back.   Rosemarie Ax, MD [facility] 02/08/2022, 12:51 PM

## 2022-02-08 NOTE — Progress Notes (Signed)
    Procedures performed today:    None.  Independent interpretation of notes and tests performed by another provider:   None.  Brief History, Exam, Impression, and Recommendations:    Primary osteoarthritis of right knee, status post left total knee arthroplasty/revision patellectomy Status post left total knee arthroplasty with revision patellectomy, also with primary osteoarthritis of the right knee. Increasing right knee pain, medial joint line as well as medial femoral condyle. Did see Dr. Raeford Razor, she is also seen Dr. Berenice Primas and had an injection about 3 weeks ago. Unfortunately persistence of pain. Mild to moderate effusion, tenderness at distal femoral medial condyle. I do think we need to get better diagnosis here, updated x-rays, MRI for diagnosis of tibial subchondral insufficiency fracture. She will go nonweightbearing with crutches for now, and I will add tramadol as needed for pain relief. If we see a subchondral insufficiency fracture she will need to be nonweightbearing with crutches for prolonged period of time, if simple osteoarthritis we will consider to revisit viscosupplementation.  Chronic process with exacerbation and pharmacologic intervention  ____________________________________________ Gwen Her. Dianah Field, M.D., ABFM., CAQSM., AME. Primary Care and Sports Medicine [facility]  Adjunct Professor of [place] of [facility] of Medicine  Risk manager

## 2022-02-10 ENCOUNTER — Ambulatory Visit (INDEPENDENT_AMBULATORY_CARE_PROVIDER_SITE_OTHER): Payer: 59

## 2022-02-10 DIAGNOSIS — M1711 Unilateral primary osteoarthritis, right knee: Secondary | ICD-10-CM

## 2022-02-10 DIAGNOSIS — M25561 Pain in right knee: Secondary | ICD-10-CM | POA: Diagnosis not present

## 2022-02-12 NOTE — Telephone Encounter (Signed)
Hi Stacey, I checked the portal for updates. [place] Falling requires a PA form. It is on the [employer] portal if you can fax it to them with the notes.  Thank you, Junie Panning

## 2022-02-12 NOTE — Telephone Encounter (Signed)
Patient has found other medical treatment for her right knee pain.

## 2022-02-13 ENCOUNTER — Ambulatory Visit (INDEPENDENT_AMBULATORY_CARE_PROVIDER_SITE_OTHER): Payer: 59 | Admitting: Sports Medicine

## 2022-02-13 ENCOUNTER — Telehealth: Payer: Self-pay | Admitting: Sports Medicine

## 2022-02-13 DIAGNOSIS — M1711 Unilateral primary osteoarthritis, right knee: Secondary | ICD-10-CM

## 2022-02-13 MED ORDER — TRAMADOL HCL 50 MG PO TABS: ORAL_TABLET | ORAL | 1 refills | Status: DC

## 2022-02-13 NOTE — Progress Notes (Signed)
    Procedures performed today:    None.  Independent interpretation of notes and tests performed by another provider:   None.  Brief History, Exam, Impression, and Recommendations:    Primary osteoarthritis of right knee, status post left total knee arthroplasty/revision patellectomy This is a very pleasant 61 year old female, she is post left total knee arthroplasty with revision papillectomy, also primary osteoarthritis of the right knee. Ultimately has severe pain, tenderness medial femoral condyle. Had an injection about 3 weeks ago with Dr. Berenice Primas without much improvement. Obtained an MRI that did show significant tearing of the meniscus as well as tricompartmental osteoarthritis as well as intra-articular loose bodies. I do think she is good to be a candidate for viscosupplementation but I also think she needs her intra-articular derangements addressed first arthroscopically. She understands that arthroscopic intervention will not solve her problem, but I do think it will make viscosupplementation more effective. They do have an anniversary trip to the [place] in September so I hope we can get things done before then. I would like Dr. Griffin Basil or Mardelle Matte to weigh in, and I will increase her tramadol to 1 pill in the morning, 2 pills at night.  Chronic process with exacerbation and pharmacologic intervention  ____________________________________________ Gwen Her. Dianah Field, M.D., ABFM., CAQSM., AME. Primary Care and Sports Medicine [facility]  Adjunct Professor of [place] of [facility] of Medicine  Risk manager

## 2022-02-13 NOTE — Telephone Encounter (Signed)
[employer] approval please, right knee, failed everything

## 2022-02-13 NOTE — Assessment & Plan Note (Signed)
This is a very pleasant 61 year old female, she is post left total knee arthroplasty with revision papillectomy, also primary osteoarthritis of the right knee. Ultimately has severe pain, tenderness medial femoral condyle. Had an injection about 3 weeks ago with Dr. Berenice Primas without much improvement. Obtained an MRI that did show significant tearing of the meniscus as well as tricompartmental osteoarthritis as well as intra-articular loose bodies. I do think she is good to be a candidate for viscosupplementation but I also think she needs her intra-articular derangements addressed first arthroscopically. She understands that arthroscopic intervention will not solve her problem, but I do think it will make viscosupplementation more effective. They do have an anniversary trip to the [place] in September so I hope we can get things done before then. I would like Dr. Griffin Basil or Mardelle Matte to weigh in, and I will increase her tramadol to 1 pill in the morning, 2 pills at night.

## 2022-02-14 ENCOUNTER — Ambulatory Visit: Payer: 59 | Admitting: Sports Medicine

## 2022-02-15 ENCOUNTER — Encounter (HOSPITAL_BASED_OUTPATIENT_CLINIC_OR_DEPARTMENT_OTHER): Payer: Self-pay | Admitting: Obstetrics & Gynecology

## 2022-02-15 ENCOUNTER — Ambulatory Visit (INDEPENDENT_AMBULATORY_CARE_PROVIDER_SITE_OTHER): Payer: 59 | Admitting: Obstetrics & Gynecology

## 2022-02-15 VITALS — BP 162/70 | HR 83 | Ht 64.0 in | Wt 231.8 lb

## 2022-02-15 DIAGNOSIS — Z780 Asymptomatic menopausal state: Secondary | ICD-10-CM

## 2022-02-15 DIAGNOSIS — Z1211 Encounter for screening for malignant neoplasm of colon: Secondary | ICD-10-CM

## 2022-02-15 DIAGNOSIS — Z90710 Acquired absence of both cervix and uterus: Secondary | ICD-10-CM | POA: Diagnosis not present

## 2022-02-15 DIAGNOSIS — Z01419 Encounter for gynecological examination (general) (routine) without abnormal findings: Secondary | ICD-10-CM

## 2022-02-15 NOTE — Progress Notes (Unsigned)
61 y.o. G0P0000 Married White or Caucasian female here for annual exam.  Having issues with right knee.  Needs to have arthroscopy on right knee due to meniscus tear.    Denies vaginal bleeding.    Patient's last menstrual period was 10/28/2006.          H/o hysterectomy Exercising: not currently due to knee injury Smoker:  no  Health Maintenance: Pap:  not needed, hysterectomy 2009 History of abnormal Pap:  no MMG:  02/07/2022 Negative Colonoscopy:  05/08/2012.  Negative.  Done with Dr. Collene Mares.  Follow up 10 years. BMD:   02/06/2022, normal.   Screening Labs: Iran Planas, PA-C   reports that she has never smoked. She has never used smokeless tobacco. She reports current alcohol use. She reports that she does not use drugs.  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   Wears glasses     Past Surgical History:  Procedure Laterality Date   ABDOMINAL HYSTERECTOMY  3/09   ABLATION OF DYSRHYTHMIC FOCUS  10/07/2012   SVT   APPENDECTOMY     FOOT SURGERY  2000   rt foot fx   HARDWARE REMOVAL  2003   rt arm   MASS EXCISION Left 07/07/2013   Procedure: EXCISION MASS LEFT RING FINGER;  Surgeon: Wynonia Sours, MD;  Location: [place];  Service: Orthopedics;  Laterality: Left;   PATELLECTOMY     REPAIR EXTENSOR TENDON Left 07/07/2013   Procedure: REPAIR EXTENSOR TENDON LEFT WITH DEBRIDMENT PROXIMAL INTERPHALANGEAL JOINT;  Surgeon: Wynonia Sours, MD;  Location: [place];  Service: Orthopedics;  Laterality: Left;   right arm reconstruction  1/01   SUPRAVENTRICULAR TACHYCARDIA ABLATION N/A 10/07/2012   Procedure: SUPRAVENTRICULAR TACHYCARDIA ABLATION;  Surgeon: Evans Lance, MD;  Location: [facility] CATH LAB;  Service: Cardiovascular;  Laterality: N/A;   TOTAL KNEE ARTHROPLASTY      Current  Outpatient Medications  Medication Sig Dispense Refill   albuterol (VENTOLIN HFA) 108 (90 Base) MCG/ACT inhaler Inhale 2 puffs into the lungs every 6 (six) hours as needed. 18 g 0   cyclobenzaprine (FLEXERIL) 10 MG tablet TAKE 1 TABLET BY MOUTH AT BEDTIME 90 tablet 3   diclofenac (VOLTAREN) 75 MG EC tablet Take 1 tablet (75 mg total) by mouth daily as needed. 90 tablet 3   esomeprazole (NEXIUM) 20 MG capsule Take by mouth.     gabapentin (NEURONTIN) 100 MG capsule TAKE 1 TO 2 CAPSULES BY MOUTH THREE TIMES DAILY 180 capsule 2   montelukast (SINGULAIR) 10 MG tablet TAKE 1 TABLET BY MOUTH AT BEDTIME 90 tablet 3   pravastatin (PRAVACHOL) 20 MG tablet Take 1 tablet (20 mg total) by mouth daily. 90 tablet 3   traMADol (ULTRAM) 50 MG tablet 1 tab in the morning, 2 tabs at night 90 tablet 1   venlafaxine XR (EFFEXOR XR) 150 MG 24 hr capsule Take 1 capsule (150 mg total) by mouth daily with breakfast. 90 capsule 1   No current facility-administered medications for this visit.    Family History  Problem Relation Age of Onset   Alcohol abuse Mother    Diabetes Mother    Hyperlipidemia Mother    Hypertension Mother    Heart disease Mother  Rheumatic disease   Alzheimer's disease Father    Diabetes Sister    Hyperlipidemia Sister    Hypertension Sister    Heart attack Maternal Uncle    Lung cancer Maternal Grandmother        lung   Heart attack Maternal Grandfather    ROS: Genitourinary:negative  Exam:   BP (!) 162/70 (BP Location: Left Arm, Patient Position: Sitting, Cuff Size: Large)   Pulse 83   Ht '5\' 4"'$  (1.626 m) Comment: reported  Wt 231 lb 12.8 oz (105.1 kg)   LMP 10/28/2006   BMI 39.79 kg/m   Height: '5\' 4"'$  (162.6 cm) (reported)  General appearance: alert, cooperative and appears stated age Head: Normocephalic, without obvious abnormality, atraumatic Neck: no adenopathy, supple, symmetrical, trachea midline and thyroid normal to inspection and palpation Lungs: clear to  auscultation bilaterally Breasts: normal appearance, no masses or tenderness Heart: regular rate and rhythm Abdomen: soft, non-tender; bowel sounds normal; no masses,  no organomegaly Extremities: extremities normal, atraumatic, no cyanosis or edema Skin: Skin color, texture, turgor normal. No rashes or lesions Lymph nodes: Cervical, supraclavicular, and axillary nodes normal. No abnormal inguinal nodes palpated Neurologic: Grossly normal   Pelvic: External genitalia:  no lesions              Urethra:  normal appearing urethra with no masses, tenderness or lesions              Bartholins and Skenes: normal                 Vagina: normal appearing vagina with normal color and no discharge, no lesions              Cervix: absent              Pap taken: No. Bimanual Exam:  Uterus:  uterus absent              Adnexa: no mass, fullness, tenderness               Rectovaginal: declines               Anus:  no lesions  Chaperone, Octaviano Batty, CMA, was present for exam.  Assessment/Plan: 1. Well woman exam with routine gynecological exam - Pap smear not indicated - last MMG 02/07/2022 - Colonoscopy 05/2022 - BMD 02/06/2022 - lab work done with PCP - vaccines reviewed and updated  2. Colon cancer screening - colonoscopy due. With knee issues and upcoming surgery, negative colonoscopy in the past and negative family hx, cologuard discussed.  Pt would like to proceed with this at this time.  Order placed. - Cologuard  3. Postmenopausal - no HRT  4. H/O: hysterectomy

## 2022-02-15 NOTE — Telephone Encounter (Signed)
Jury duty excuse letter request forwarded to PCP.

## 2022-02-15 NOTE — Telephone Encounter (Signed)
[employer] paperwork faxed to [employer] at [phone] Request is for Orthovisc Pt's insurance prefers Orthovisc Fax confirmation receipt received  

## 2022-02-18 ENCOUNTER — Ambulatory Visit: Payer: 59 | Admitting: Sports Medicine

## 2022-02-26 ENCOUNTER — Other Ambulatory Visit: Payer: Self-pay | Admitting: Sports Medicine

## 2022-02-27 ENCOUNTER — Telehealth: Payer: Self-pay

## 2022-02-27 NOTE — Telephone Encounter (Signed)
Initiated Prior authorization [id number] HCl '50MG'$  tablets Via: Covermymeds Case/Key:[id number] Status: approved  as of 02/27/22 Reason: Notified Pt via: Mychart

## 2022-02-28 NOTE — Telephone Encounter (Signed)
Benefits Investigation Details received from [employer] Injection: Orthovisc  Medical: Deductible applies. Once the deductible has been met, pt is responsible for co-insurance. Once the OOP has been met, pt is covered at 100%.  PA required: Yes PA form and medical records faxed to [place] at [phone]  Fax confirmation received  Pharmacy: Product not covered under pharmacy plan.   Specialty Pharm[facility]: [facility]  May fill through: [employer] Copay/Coinsurance: $40  Product Copay: 20% ($140) Administration Coinsurance: 20% ($26) Administration Copay:  Deductible: $1500 (met: $572.05) Out of Pocket Max: $7000 (met: $875.08)    Waiting for PA results.

## 2022-03-05 DIAGNOSIS — M25561 Pain in right knee: Secondary | ICD-10-CM | POA: Diagnosis not present

## 2022-03-06 ENCOUNTER — Encounter: Payer: Self-pay | Admitting: Neurology

## 2022-03-07 ENCOUNTER — Encounter (HOSPITAL_BASED_OUTPATIENT_CLINIC_OR_DEPARTMENT_OTHER): Payer: Self-pay | Admitting: Orthopaedic Surgery

## 2022-03-07 ENCOUNTER — Other Ambulatory Visit: Payer: Self-pay

## 2022-03-08 NOTE — Progress Notes (Signed)

## 2022-03-11 ENCOUNTER — Encounter: Payer: Self-pay | Admitting: Physician Assistant

## 2022-03-11 ENCOUNTER — Other Ambulatory Visit: Payer: Self-pay | Admitting: Physician Assistant

## 2022-03-11 ENCOUNTER — Ambulatory Visit (INDEPENDENT_AMBULATORY_CARE_PROVIDER_SITE_OTHER): Payer: 59 | Admitting: Physician Assistant

## 2022-03-11 VITALS — BP 149/83 | HR 89 | Temp 98.2°F | Ht 64.0 in | Wt 230.0 lb

## 2022-03-11 DIAGNOSIS — R5383 Other fatigue: Secondary | ICD-10-CM

## 2022-03-11 DIAGNOSIS — F419 Anxiety disorder, unspecified: Secondary | ICD-10-CM | POA: Diagnosis not present

## 2022-03-11 DIAGNOSIS — E559 Vitamin D deficiency, unspecified: Secondary | ICD-10-CM

## 2022-03-11 DIAGNOSIS — R61 Generalized hyperhidrosis: Secondary | ICD-10-CM | POA: Diagnosis not present

## 2022-03-11 DIAGNOSIS — Z1329 Encounter for screening for other suspected endocrine disorder: Secondary | ICD-10-CM | POA: Diagnosis not present

## 2022-03-11 DIAGNOSIS — F331 Major depressive disorder, recurrent, moderate: Secondary | ICD-10-CM

## 2022-03-11 DIAGNOSIS — Z79899 Other long term (current) drug therapy: Secondary | ICD-10-CM

## 2022-03-11 DIAGNOSIS — G894 Chronic pain syndrome: Secondary | ICD-10-CM

## 2022-03-11 DIAGNOSIS — I10 Essential (primary) hypertension: Secondary | ICD-10-CM | POA: Diagnosis not present

## 2022-03-11 DIAGNOSIS — R7301 Impaired fasting glucose: Secondary | ICD-10-CM

## 2022-03-11 DIAGNOSIS — G4700 Insomnia, unspecified: Secondary | ICD-10-CM | POA: Diagnosis not present

## 2022-03-11 DIAGNOSIS — R69 Illness, unspecified: Secondary | ICD-10-CM | POA: Diagnosis not present

## 2022-03-11 DIAGNOSIS — Z13220 Encounter for screening for lipoid disorders: Secondary | ICD-10-CM | POA: Diagnosis not present

## 2022-03-11 MED ORDER — TRAZODONE HCL 50 MG PO TABS: 25.0000 mg | ORAL_TABLET | Freq: Every evening | ORAL | 1 refills | Status: DC | PRN

## 2022-03-11 NOTE — Patient Instructions (Addendum)
Trial trazodone at bedtime for sleep.

## 2022-03-11 NOTE — Progress Notes (Signed)
Established Patient Office Visit  Subjective   Patient ID: Tracy Landry, female    DOB: Mar 11, 1961  Age: 61 y.o. MRN: [id number]  Chief Complaint  Patient presents with   Follow-up    HPI Pt is a 61 yo obese female who presents to the clinic to discuss night sweats and low grade fevers, fatigue, mood changes with anxiety and depression.   Pt does have surgery planned this week for her right knee to repair her meniscus. She is hopeful this will allow her to get back to being more active.   She is most concerned that she can fall asleep any where. She is not sleeping well at night. She struggles to get to sleep. She takes 2 gabapentin, flexeril, benadryl, tramadol at bedtime and does not go to sleep. Some snoring but does not feel like she stops breathing.   Pt is not bruising or swelling. She denies any SOB. She has had to take 37.5 effexor for a few days after panic attack. No SI/HC.   Her temperature never gets above 100.0 but reports feeling hot and at times having headache.   .. Active Ambulatory Problems    Diagnosis Date Noted   HYPERLIPIDEMIA 08/22/2006   Class 2 severe obesity due to excess calories with serious comorbidity and body mass index (BMI) of 37.0 to 37.9 in adult ([place]) 06/01/2006   ASTHMA, PERSISTENT, MILD 08/12/2006   GASTROESOPHAGEAL REFLUX, NO ESOPHAGITIS 05/13/2006   ANKYLOSING SPONDYLITIS 01/06/2007   COUGH 05/15/2010   SVT (supraventricular tachycardia) ([place]) 04/15/2012   Fatigue 04/15/2012   Essential hypertension 07/15/2012   Primary osteoarthritis, right shoulder 02/03/2014   Plantar fasciitis, left 06/09/2014   Anxiety 03/28/2015   Low back pain 02/22/2016   Primary osteoarthritis of right knee, status post left total knee arthroplasty/revision patellectomy 05/08/2016   Periscapular pain 06/03/2016   Depression 03/15/2011   Primary osteoarthritis of first carpometacarpal joint of right hand 03/26/2018   Acute medial meniscus tear of left knee  03/26/2019   Arthritis of knee, left 03/15/2019   Asthma 08/12/2006   Dupuytren's disease 09/30/2018   Family history of diabetes mellitus in mother 04/01/2019   Impaired fasting glucose 04/02/2019   S/P TKR (total knee replacement) using cement, left 05/07/2019   Strain of rhomboid muscle 03/12/2021   Moderate episode of recurrent major depressive disorder ([place]) 03/12/2021   Chronic pain syndrome 03/12/2021   Non-restorative sleep 03/12/2021   Snoring 03/12/2021   Insufficiency fracture of medial femoral condyle ([place]) 01/30/2022   Insomnia 03/11/2022   Resolved Ambulatory Problems    Diagnosis Date Noted   VIRAL URI 07/14/2007   RHINITIS, ALLERGIC 05/13/2006   OSTEOPENIA 05/13/2006   Palpitations 08/03/2007   CHEST PAIN, RIGHT 05/15/2010   Essential hypertension, malignant 04/15/2012   Thrush of mouth and esophagus ([place]) 03/26/2013   Peroneal tendinitis of left lower leg 04/01/2014   Left tibialis posterior tendonitis 08/08/2014   Puncture wound 09/13/2015   Right wrist pain 09/26/2016   Closed Colles' fracture of right radius 10/30/2015   Estrogen deficiency 01/30/2022   Past Medical History:  Diagnosis Date   Back pain    GERD (gastroesophageal reflux disease)    Hypertension    Wears glasses      ROS See HPI.    Objective:     BP (!) 149/83   Pulse 89   Temp 98.2 F (36.8 C) (Oral)   Ht '5\' 4"'$  (1.626 m)   Wt 230 lb (104.3 kg)  LMP 10/28/2006   SpO2 97%   BMI 39.48 kg/m  BP Readings from Last 3 Encounters:  03/11/22 (!) 149/83  02/15/22 (!) 162/70  01/30/22 140/88   Wt Readings from Last 3 Encounters:  03/11/22 230 lb (104.3 kg)  02/15/22 231 lb 12.8 oz (105.1 kg)  01/30/22 225 lb (102.1 kg)      Physical Exam Vitals reviewed.  Constitutional:      Appearance: Normal appearance. She is obese.  HENT:     Head: Normocephalic.  Cardiovascular:     Rate and Rhythm: Normal rate and regular rhythm.     Pulses: Normal pulses.  Pulmonary:      Effort: Pulmonary effort is normal.  Musculoskeletal:     Right lower leg: No edema.     Left lower leg: No edema.  Neurological:     General: No focal deficit present.     Mental Status: She is alert and oriented to person, place, and time.  Psychiatric:        Mood and Affect: Mood normal.    ..    03/11/2022   11:33 AM 02/15/2022   10:44 AM 11/27/2021   11:02 AM 04/10/2021    1:19 PM  Depression screen PHQ 2/9  Decreased Interest 1 0 0 0  Down, Depressed, Hopeless 0 0 0 0  PHQ - 2 Score 1 0 0 0  Altered sleeping 3   1  Tired, decreased energy 3   1  Change in appetite 1   0  Feeling bad or failure about yourself  0   0  Trouble concentrating 2   0  Moving slowly or fidgety/restless 0   0  Suicidal thoughts 0   0  PHQ-9 Score 10   2  Difficult doing work/chores Somewhat difficult   Not difficult at all   ..    03/11/2022   11:33 AM 04/10/2021    1:19 PM  GAD 7 : Generalized Anxiety Score  Nervous, Anxious, on Edge 1 1  Control/stop worrying 1 1  Worry too much - different things 1 1  Trouble relaxing 1 1  Restless 1 0  Easily annoyed or irritable 1 0  Afraid - awful might happen 0 0  Total GAD 7 Score 6 4  Anxiety Difficulty Somewhat difficult Not difficult at all          Assessment & Plan:  Marland KitchenMarland KitchenBiddie was seen today for follow-up.  Diagnoses and all orders for this visit:  Essential hypertension -     COMPLETE METABOLIC PANEL WITH GFR  Screening for lipid disorders -     Lipid Panel w/reflex Direct LDL  Impaired fasting glucose -     COMPLETE METABOLIC PANEL WITH GFR -     Hemoglobin A1c  Medication management -     TSH -     Lipid Panel w/reflex Direct LDL -     COMPLETE METABOLIC PANEL WITH GFR -     CBC with Differential/Platelet -     Hemoglobin A1c -     Vitamin D (25 hydroxy)  Thyroid disorder screen -     TSH  Vitamin D deficiency -     Vitamin D (25 hydroxy)  Night sweats -     B12 and Folate Panel  No energy -     B12 and Folate  Panel  Insomnia, unspecified type -     traZODone (DESYREL) 50 MG tablet; Take 0.5-1 tablets (25-50 mg total) by mouth at  bedtime as needed for sleep.  Moderate episode of recurrent major depressive disorder (HCC)  Anxiety   Elevated PHQ/GAD Discussed increasing effexor. Pt declined. Will wait until after knee surgery to make any mood medication changes Labs to evaluate fatigue ordered today Sweats/fatigue/temperature changes could be medication related(gabapentin?) Declined sleep study Trazodone for sleep at bedtime Discussed importance of sleep with energy and mood    Return in about 3 months (around 06/11/2022), or if symptoms worsen or fail to improve.    Iran Planas, PA-C

## 2022-03-11 NOTE — H&P (Signed)
PREOPERATIVE H&P  Chief Complaint: RIGHT KNEE MENISCUS TEAR  HPI: Tracy Landry is a 61 y.o. female who is scheduled for, Procedure(s): KNEE ARTHROSCOPY WITH MEDIAL MENISECTOMY.   Patient has a past medical history significant for GERD, HTN, SVT.   He is a 61 year old who has had a history of multiple surgeries on the left knee. She had a total knee and eventual patellectomy after three additional surgeries, performed by Dr. Mayer Landry at the end, she had multiple other surgeons prior. She is very uninterested in a knee replacement. She has mechanical symptoms in the right knee. She is tender to palpation over the medial joint line. She wants to talk about what her options are.  Symptoms are rated as moderate to severe, and have been worsening.  This is significantly impairing activities of daily living.    Please see clinic note for further details on this patient's care.    She has elected for surgical management.   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   Wears glasses    Past Surgical History:  Procedure Laterality Date   ABLATION OF DYSRHYTHMIC FOCUS  10/07/2012   SVT   APPENDECTOMY     FOOT SURGERY  2000   rt foot fx   HARDWARE REMOVAL  2003   rt arm   MASS EXCISION Left 07/07/2013   Procedure: EXCISION MASS LEFT RING FINGER;  Surgeon: Wynonia Sours, MD;  Location: [facility];  Service: Orthopedics;  Laterality: Left;   PATELLECTOMY     REPAIR EXTENSOR TENDON Left 07/07/2013   Procedure: REPAIR EXTENSOR TENDON LEFT WITH DEBRIDMENT PROXIMAL INTERPHALANGEAL JOINT;  Surgeon: Wynonia Sours, MD;  Location: [facility];  Service: Orthopedics;  Laterality: Left;   right arm reconstruction  08/1999   SUPRAVENTRICULAR TACHYCARDIA ABLATION N/A 10/07/2012   Procedure:  SUPRAVENTRICULAR TACHYCARDIA ABLATION;  Surgeon: Evans Lance, MD;  Location: [facility] CATH LAB;  Service: Cardiovascular;  Laterality: N/A;   TOTAL ABDOMINAL HYSTERECTOMY  10/2007   TOTAL KNEE ARTHROPLASTY     Social History   Socioeconomic History   Marital status: Married    Spouse name: Tracy Landry   Number of children: Not on file   Years of education: Not on file   Highest education level: Not on file  Occupational History    Employer: [employer]    Comment: [place]  Tobacco Use   Smoking status: Never   Smokeless tobacco: Never  Vaping Use   Vaping Use: Former  Substance and Sexual Activity   Alcohol use: Yes    Comment: 3 a week   Drug use: No   Sexual activity: Yes    Partners: Female    Comment: Female  Other Topics Concern   Not on file  Social History Narrative   Not on file   Social Determinants of Health   Financial Resource Strain: Not on file  Food Insecurity: Not on file  Transportation Needs: Not on file  Physical Activity: Not on file  Stress: Not on file  Social Connections: Not on file   Family History  Problem Relation Age of Onset   Alcohol abuse Mother    Diabetes Mother    Hyperlipidemia Mother    Hypertension Mother  Heart disease Mother        Rheumatic disease   Alzheimer's disease Father    Diabetes Sister    Hyperlipidemia Sister    Hypertension Sister    Heart attack Maternal Uncle    Lung cancer Maternal Grandmother        lung   Heart attack Maternal Grandfather    Allergies  Allergen Reactions   Chlorhexidine Gluconate Itching   Amoxicillin-Pot Clavulanate Diarrhea    Diarrhea/abdominal cramping   Codeine Sulfate Itching    Side effect   Prior to Admission medications   Medication Sig Start Date End Date Taking? Authorizing Provider  albuterol (VENTOLIN HFA) 108 (90 Base) MCG/ACT inhaler Inhale 2 puffs into the lungs every 6 (six) hours as needed. 03/09/21  Yes Breeback, Jade L, PA-C  cyclobenzaprine  (FLEXERIL) 10 MG tablet TAKE 1 TABLET BY MOUTH AT BEDTIME 11/27/21  Yes Breeback, Jade L, PA-C  diclofenac (VOLTAREN) 75 MG EC tablet Take 1 tablet (75 mg total) by mouth daily as needed. Patient not taking: Reported on 03/11/2022 11/27/21  Yes Breeback, Jade L, PA-C  esomeprazole (NEXIUM) 20 MG capsule Take by mouth.   Yes [provider]  montelukast (SINGULAIR) 10 MG tablet TAKE 1 TABLET BY MOUTH AT BEDTIME 07/23/21  Yes Breeback, Jade L, PA-C  pravastatin (PRAVACHOL) 20 MG tablet Take 1 tablet (20 mg total) by mouth daily. 04/23/21  Yes Breeback, Jade L, PA-C  traMADol (ULTRAM) 50 MG tablet 1 tab in the morning, 2 tabs at night 02/13/22  Yes Silverio Decamp, MD  venlafaxine XR (EFFEXOR XR) 150 MG 24 hr capsule Take 1 capsule (150 mg total) by mouth daily with breakfast. 10/31/21  Yes Breeback, Jade L, PA-C  gabapentin (NEURONTIN) 100 MG capsule TAKE 1 TO 2 CAPSULES BY MOUTH THREE TIMES DAILY 03/11/22   Breeback, Jade L, PA-C  traZODone (DESYREL) 50 MG tablet Take 0.5-1 tablets (25-50 mg total) by mouth at bedtime as needed for sleep. 03/11/22   Breeback, Jade L, PA-C    ROS: All other systems have been reviewed and were otherwise negative with the exception of those mentioned in the HPI and as above.  Physical Exam: General: Alert, no acute distress Cardiovascular: No pedal edema Respiratory: No cyanosis, no use of accessory musculature GI: No organomegaly, abdomen is soft and non-tender Skin: No lesions in the area of chief complaint Neurologic: Sensation intact distally Psychiatric: Patient is competent for consent with normal mood and affect Lymphatic: No axillary or cervical lymphadenopathy  MUSCULOSKELETAL:  The right knee range of motion is 0-60 degrees. Pain with deep flexion. Tender to palpation over the medial joint line and positive McMurray's.   Imaging: MRI demonstrates moderate arthritis in the medial compartment.  End stage arthritis in the patellofemoral  compartment. Loose body and medial meniscus tear.  Assessment: RIGHT KNEE MENISCUS TEAR  Plan: Plan for Procedure(s): KNEE ARTHROSCOPY WITH MEDIAL MENISECTOMY  We had a frank conversation with Tracy Landry about her options. She has a 50/50 chance of being happy and satisfied with her knee. Considering her long history with a knee replacement and she is quite understandably apprehension going forward with that. We think it is appropriate to go forward with mechanical symptoms with an arthroscopy, partial meniscectomy and loose body excision. She elected to proceed. We will work on surgical scheduling. She will do gel injections with Dr. Suzanna Obey if she does not make progress. If she fails all of these thing she may end up taking to Dr.  Mayer Landry about an arthroplasty.  The risks benefits and alternatives were discussed with the patient including but not limited to the risks of nonoperative treatment, versus surgical intervention including infection, bleeding, nerve injury,  blood clots, cardiopulmonary complications, morbidity, mortality, among others, and they were willing to proceed.   The patient acknowledged the explanation, agreed to proceed with the plan and consent was signed.   Operative Plan: Right knee scope with medial meniscectomy and loose body excision Discharge Medications: Standard DVT Prophylaxis: Aspirin Physical Therapy: Outpatient PT Special Discharge needs: +/-   Ethelda Chick, PA-C  03/11/2022 5:08 PM

## 2022-03-12 LAB — HEMOGLOBIN A1C
Hgb A1c MFr Bld: 5.5 % of total Hgb (ref <5.7)
Mean Plasma Glucose: 111 mg/dL
eAG (mmol/L): 6.2 mmol/L

## 2022-03-12 LAB — TSH: TSH: 2.57 mIU/L (ref 0.40–4.50)

## 2022-03-12 LAB — LIPID PANEL W/REFLEX DIRECT LDL
Cholesterol: 238 mg/dL — ABNORMAL HIGH (ref <200)
HDL: 65 mg/dL (ref >=50)
LDL Cholesterol (Calc): 140 mg/dL (calc) — ABNORMAL HIGH
Non-HDL Cholesterol (Calc): 173 mg/dL (calc) — ABNORMAL HIGH (ref <130)
Total CHOL/HDL Ratio: 3.7 (calc) (ref <5.0)
Triglycerides: 190 mg/dL — ABNORMAL HIGH (ref <150)

## 2022-03-12 LAB — COMPLETE METABOLIC PANEL WITH GFR
AG Ratio: 1.3 (calc) (ref 1.0–2.5)
ALT: 25 U/L (ref 6–29)
AST: 22 U/L (ref 10–35)
Albumin: 4.4 g/dL (ref 3.6–5.1)
Alkaline phosphatase (APISO): 75 U/L (ref 37–153)
BUN: 14 mg/dL (ref 7–25)
CO2: 28 mmol/L (ref 20–32)
Calcium: 9.8 mg/dL (ref 8.6–10.4)
Chloride: 102 mmol/L (ref 98–110)
Creat: 0.82 mg/dL (ref 0.50–1.03)
Globulin: 3.3 g/dL (calc) (ref 1.9–3.7)
Glucose, Bld: 110 mg/dL — ABNORMAL HIGH (ref 65–99)
Potassium: 4.4 mmol/L (ref 3.5–5.3)
Sodium: 140 mmol/L (ref 135–146)
Total Bilirubin: 0.3 mg/dL (ref 0.2–1.2)
Total Protein: 7.7 g/dL (ref 6.1–8.1)
eGFR: 82 mL/min/{1.73_m2} (ref >=60)

## 2022-03-12 LAB — CBC WITH DIFFERENTIAL/PLATELET
Absolute Monocytes: 516 cells/uL (ref 200–950)
Basophils Absolute: 31 cells/uL (ref 0–200)
Basophils Relative: 0.4 %
Eosinophils Absolute: 139 cells/uL (ref 15–500)
Eosinophils Relative: 1.8 %
HCT: 41.2 % (ref 35.0–45.0)
Hemoglobin: 13.9 g/dL (ref 11.7–15.5)
Lymphs Abs: 2849 cells/uL (ref 850–3900)
MCH: 31 pg (ref 27.0–33.0)
MCHC: 33.7 g/dL (ref 32.0–36.0)
MCV: 91.8 fL (ref 80.0–100.0)
MPV: 10.9 fL (ref 7.5–12.5)
Monocytes Relative: 6.7 %
Neutro Abs: 4166 cells/uL (ref 1500–7800)
Neutrophils Relative %: 54.1 %
Platelets: 352 10*3/uL (ref 140–400)
RBC: 4.49 10*6/uL (ref 3.80–5.10)
RDW: 12.8 % (ref 11.0–15.0)
Total Lymphocyte: 37 %
WBC: 7.7 10*3/uL (ref 3.8–10.8)

## 2022-03-12 LAB — B12 AND FOLATE PANEL
Folate: 23.3 ng/mL
Vitamin B-12: 516 pg/mL (ref 200–1100)

## 2022-03-12 LAB — VITAMIN D 25 HYDROXY (VIT D DEFICIENCY, FRACTURES): Vit D, 25-Hydroxy: 34 ng/mL (ref 30–100)

## 2022-03-12 NOTE — Progress Notes (Signed)
[name],   Overall your labs look pretty good.  B12 looks great.  Vitamin D could be a little better. How much are you taking?  A1C normal range. No diabetes.  WBC/Hemoglobin/platelets look great.  Thyroid normal.  Kidney, liver, electrolytes look good.   Cholesterol stable. Risk is still below 7.5 percent and on statin which is great for prevention of CV event.   Marland Kitchen.The 10-year ASCVD risk score (Arnett DK, et al., 2019) is: 4.1%   Values used to calculate the score:     Age: 61 years     Sex: Female     Is Non-Hispanic African American: No     Diabetic: No     Tobacco smoker: No     Systolic Blood Pressure: 333 mmHg     Is BP treated: No     HDL Cholesterol: 65 mg/dL     Total Cholesterol: 238 mg/dL  Consider getting a sleep study to further evaluate fatigue!

## 2022-03-13 DIAGNOSIS — Z1211 Encounter for screening for malignant neoplasm of colon: Secondary | ICD-10-CM | POA: Diagnosis not present

## 2022-03-13 LAB — COLOGUARD: Cologuard: NEGATIVE

## 2022-03-13 NOTE — Discharge Instructions (Signed)
Ophelia Charter MD, MPH Noemi Chapel, PA-C [place] [address] [phone] (tel)   [phone] (fax)   POST-OPERATIVE INSTRUCTIONS - Meniscus Repair  WOUND CARE - You may remove the Operative Dressing on Post-Op Day #3 (72hrs after surgery).   - Alternatively if you would like you can leave dressing on until follow-up if within 7-8 days but keep it dry. - Leave steri-strips in place until they fall off on their own, usually 2 weeks postop. - An ACE wrap may be used to control swelling, do not wrap this too tight.  If the initial ACE wrap feels too tight you may loosen it. - There may be a small amount of fluid/bleeding leaking at the surgical site.  - This is normal; the knee is filled with fluid during the procedure and can leak for 24-48hrs after surgery.  - You may change/reinforce the bandage as needed.  - Use the Cryocuff or Ice as often as possible for the first 7 days, then as needed for pain relief. Always keep a towel, ACE wrap or other barrier between the cooling unit and your skin.  - You may shower on Post-Op Day #3. Gently pat the area dry.  - Do not soak the knee in water or submerge it.  - Do not go swimming in the pool or ocean until 4 weeks after surgery or when otherwise instructed.  Keep incisions as dry as possible.   BRACE/AMBULATION - You will be placed in a brace post-operatively.  - Wear your brace at all times until follow-up.  - You may remove for hygiene. -           Use crutches to help you ambulate -           Touch-down weight bearing: when you stand or walk, you may only touch your toes to the floor for balance -           Do NOT put any body weight on your leg  PHYSICAL THERAPY - You will begin physical therapy soon after surgery (unless otherwise specified) - Please call to set up an appointment, if you do not already have one  - Let our office if there are any issues with scheduling your therapy  - You have a  physical therapy appointment scheduled at [facility] (across the hall from our office) on Monday, 8/14 at [facility] ([place]) The anesthesia team may have performed a nerve block for you this is a great tool used to minimize pain.   The block may start wearing off overnight (between 8-24 hours postop) When the block wears off, your pain may go from nearly zero to the pain you would have had postop without the block. This is an abrupt transition but nothing dangerous is happening.   This can be a challenging period but utilize your as needed pain medications to try and manage this period. We suggest you use the pain medication the first night prior to going to bed, to ease this transition.  You may take an extra dose of narcotic when this happens if needed  POST-OP MEDICATIONS- Multimodal approach to pain control In general your pain will be controlled with a combination of substances.  Prescriptions unless otherwise discussed are electronically sent to your pharmacy.  This is a carefully made plan we use to minimize narcotic use.     Meloxicam - Anti-inflammatory medication taken on a scheduled basis Acetaminophen - Non-narcotic pain  medicine taken on a scheduled basis  Oxycodone - This is a strong narcotic, to be used only on an "as needed" basis for SEVERE pain. Aspirin '81mg'$  - This medicine is used to minimize the risk of blood clots after surgery. Phenergan - take as needed for nausea   FOLLOW-UP   Please call the office to schedule a follow-up appointment for your incision check, 7-10 days post-operatively.  IF YOU HAVE ANY QUESTIONS, PLEASE FEEL FREE TO CALL OUR OFFICE.   HELPFUL INFORMATION    Keep your leg elevated to decrease swelling, which will then in turn decrease your pain. I would elevate the foot of your bed by putting a couple of couch pillows between your mattress and box spring. I would not keep pillow directly under your ankle.  - Do not sleep  with a pillow behind your knee even if it is more comfortable as this may make it harder to get your knee fully straight long term.   There will be MORE swelling on days 1-3 than there is on the day of surgery.  This also is normal. The swelling will decrease with the anti-inflammatory medication, ice and keeping it elevated. The swelling will make it more difficult to bend your knee. As the swelling goes down your motion will become easier   You may develop swelling and bruising that extends from your knee down to your calf and perhaps even to your foot over the next week. Do not be alarmed. This too is normal, and it is due to gravity   There may be some numbness adjacent to the incision site. This may last for 6-12 months or longer in some patients and is expected.   You may return to sedentary work/school in the next couple of days when you feel up to it. You will need to keep your leg elevated as much as possible    You should wean off your narcotic medicines as soon as you are able.  Most patients will be off or using minimal narcotics before their first postop appointment.    We suggest you use the pain medication the first night prior to going to bed, in order to ease any pain when the anesthesia wears off. You should avoid taking pain medications on an empty stomach as it will make you nauseous.   Do not drink alcoholic beverages or take illicit drugs when taking pain medications.   It is against the law to drive while taking narcotics. You cannot drive if your Right leg is in brace locked in extension.   Pain medication may make you constipated.  Below are a few solutions to try in this order:  o Decrease the amount of pain medication if you aren't having pain.  o Drink lots of decaffeinated fluids.  o Drink prune juice and/or each dried prunes   o If the first 3 don't work start with additional solutions  o Take Colace - an over-the-counter stool softener  o Take Senokot  - an over-the-counter laxative  o Take Miralax - a stronger over-the-counter laxative   For more information including helpful videos and documents visit our website:   [email or web address]     No Tylenol before 5pm.  No Ibuprofen/NSAIDS before 8:45pm.   Post Anesthesia Home Care Instructions  Activity: Get plenty of rest for the remainder of the day. A responsible individual must stay with you for 24 hours following the procedure.  For the next 24 hours, DO NOT: -Drive a car -Operate  machinery -Drink alcoholic beverages -Take any medication unless instructed by your physician -Make any legal decisions or sign important papers.  Meals: Start with liquid foods such as gelatin or soup. Progress to regular foods as tolerated. Avoid greasy, spicy, heavy foods. If nausea and/or vomiting occur, drink only clear liquids until the nausea and/or vomiting subsides. Call your physician if vomiting continues.  Special Instructions/Symptoms: Your throat may feel dry or sore from the anesthesia or the breathing tube placed in your throat during surgery. If this causes discomfort, gargle with warm salt water. The discomfort should disappear within 24 hours.  If you had a scopolamine patch placed behind your ear for the management of post- operative nausea and/or vomiting:  1. The medication in the patch is effective for 72 hours, after which it should be removed.  Wrap patch in a tissue and discard in the trash. Wash hands thoroughly with soap and water. 2. You may remove the patch earlier than 72 hours if you experience unpleasant side effects which may include dry mouth, dizziness or visual disturbances. 3. Avoid touching the patch. Wash your hands with soap and water after contact with the patch.

## 2022-03-14 ENCOUNTER — Encounter (HOSPITAL_BASED_OUTPATIENT_CLINIC_OR_DEPARTMENT_OTHER): Payer: Self-pay | Admitting: Orthopaedic Surgery

## 2022-03-14 ENCOUNTER — Encounter (HOSPITAL_BASED_OUTPATIENT_CLINIC_OR_DEPARTMENT_OTHER)
Admission: RE | Disposition: A | Discharge status: Home / Self Care | Payer: Self-pay | Source: Home / Self Care | Attending: Orthopaedic Surgery

## 2022-03-14 ENCOUNTER — Ambulatory Visit (HOSPITAL_BASED_OUTPATIENT_CLINIC_OR_DEPARTMENT_OTHER)
Admission: RE | Admit: 2022-03-14 | Discharge: 2022-03-14 | Disposition: A | Discharge status: Home / Self Care | Payer: 59 | Attending: Orthopaedic Surgery | Admitting: Orthopaedic Surgery

## 2022-03-14 ENCOUNTER — Ambulatory Visit (HOSPITAL_BASED_OUTPATIENT_CLINIC_OR_DEPARTMENT_OTHER): Payer: 59 | Admitting: Certified Registered"

## 2022-03-14 ENCOUNTER — Other Ambulatory Visit: Payer: Self-pay

## 2022-03-14 DIAGNOSIS — S83241A Other tear of medial meniscus, current injury, right knee, initial encounter: Secondary | ICD-10-CM

## 2022-03-14 DIAGNOSIS — M2341 Loose body in knee, right knee: Secondary | ICD-10-CM

## 2022-03-14 DIAGNOSIS — K219 Gastro-esophageal reflux disease without esophagitis: Secondary | ICD-10-CM | POA: Diagnosis not present

## 2022-03-14 DIAGNOSIS — R69 Illness, unspecified: Secondary | ICD-10-CM | POA: Diagnosis not present

## 2022-03-14 DIAGNOSIS — Z6839 Body mass index (BMI) 39.0-39.9, adult: Secondary | ICD-10-CM | POA: Insufficient documentation

## 2022-03-14 DIAGNOSIS — F419 Anxiety disorder, unspecified: Secondary | ICD-10-CM | POA: Diagnosis not present

## 2022-03-14 DIAGNOSIS — X58XXXA Exposure to other specified factors, initial encounter: Secondary | ICD-10-CM | POA: Diagnosis not present

## 2022-03-14 DIAGNOSIS — I10 Essential (primary) hypertension: Secondary | ICD-10-CM

## 2022-03-14 DIAGNOSIS — F418 Other specified anxiety disorders: Secondary | ICD-10-CM | POA: Diagnosis not present

## 2022-03-14 DIAGNOSIS — I471 Supraventricular tachycardia: Secondary | ICD-10-CM | POA: Diagnosis not present

## 2022-03-14 DIAGNOSIS — F32A Depression, unspecified: Secondary | ICD-10-CM | POA: Insufficient documentation

## 2022-03-14 DIAGNOSIS — Z96652 Presence of left artificial knee joint: Secondary | ICD-10-CM | POA: Insufficient documentation

## 2022-03-14 DIAGNOSIS — G894 Chronic pain syndrome: Secondary | ICD-10-CM | POA: Insufficient documentation

## 2022-03-14 DIAGNOSIS — J45909 Unspecified asthma, uncomplicated: Secondary | ICD-10-CM | POA: Diagnosis not present

## 2022-03-14 HISTORY — PX: KNEE ARTHROSCOPY WITH MEDIAL MENISECTOMY: SHX5651

## 2022-03-14 HISTORY — DX: Nausea with vomiting, unspecified: R11.2

## 2022-03-14 HISTORY — DX: Other specified postprocedural states: Z98.890

## 2022-03-14 SURGERY — ARTHROSCOPY, KNEE, WITH MEDIAL MENISCECTOMY
Anesthesia: General | Site: Knee | Laterality: Right

## 2022-03-14 MED ORDER — BUPIVACAINE HCL (PF) 0.5 % IJ SOLN
INTRAMUSCULAR | Status: DC | PRN
  Administered 2022-03-14: 30 mL

## 2022-03-14 MED ORDER — PROMETHAZINE HCL 12.5 MG PO TABS: 12.5000 mg | ORAL_TABLET | Freq: Four times a day (QID) | ORAL | 0 refills | Status: DC | PRN

## 2022-03-14 MED ORDER — CEFAZOLIN SODIUM-DEXTROSE 2-4 GM/100ML-% IV SOLN
2.0000 g | INTRAVENOUS | Status: AC
  Administered 2022-03-14: 2 g via INTRAVENOUS

## 2022-03-14 MED ORDER — HYDROMORPHONE HCL 1 MG/ML IJ SOLN
INTRAMUSCULAR | Status: AC
  Filled 2022-03-14: qty 0.5

## 2022-03-14 MED ORDER — ONDANSETRON HCL 4 MG/2ML IJ SOLN: 4.0000 mg | Freq: Once | INTRAMUSCULAR | Status: DC | PRN

## 2022-03-14 MED ORDER — TRAMADOL HCL 50 MG PO TABS: 50.0000 mg | ORAL_TABLET | Freq: Four times a day (QID) | ORAL | 0 refills | Status: DC | PRN

## 2022-03-14 MED ORDER — PROPOFOL 10 MG/ML IV BOLUS
INTRAVENOUS | Status: DC | PRN
  Administered 2022-03-14: 150 mg via INTRAVENOUS
  Administered 2022-03-14: 50 mg via INTRAVENOUS

## 2022-03-14 MED ORDER — LIDOCAINE HCL (CARDIAC) PF 100 MG/5ML IV SOSY
PREFILLED_SYRINGE | INTRAVENOUS | Status: DC | PRN
  Administered 2022-03-14: 60 mg via INTRAVENOUS

## 2022-03-14 MED ORDER — ACETAMINOPHEN 500 MG PO TABS
1000.0000 mg | ORAL_TABLET | Freq: Once | ORAL | Status: AC
  Administered 2022-03-14: 1000 mg via ORAL

## 2022-03-14 MED ORDER — OXYCODONE HCL 5 MG PO TABS
5.0000 mg | ORAL_TABLET | Freq: Once | ORAL | Status: AC | PRN
  Administered 2022-03-14: 5 mg via ORAL

## 2022-03-14 MED ORDER — CEFAZOLIN SODIUM-DEXTROSE 2-4 GM/100ML-% IV SOLN
INTRAVENOUS | Status: AC
  Filled 2022-03-14: qty 100

## 2022-03-14 MED ORDER — GABAPENTIN 300 MG PO CAPS
ORAL_CAPSULE | ORAL | Status: AC
  Filled 2022-03-14: qty 1

## 2022-03-14 MED ORDER — MIDAZOLAM HCL 5 MG/5ML IJ SOLN
INTRAMUSCULAR | Status: DC | PRN
  Administered 2022-03-14: 2 mg via INTRAVENOUS

## 2022-03-14 MED ORDER — ACETAMINOPHEN 500 MG PO TABS: 1000.0000 mg | ORAL_TABLET | Freq: Three times a day (TID) | ORAL | 0 refills | Status: AC

## 2022-03-14 MED ORDER — ONDANSETRON HCL 4 MG/2ML IJ SOLN
INTRAMUSCULAR | Status: DC | PRN
  Administered 2022-03-14: 4 mg via INTRAVENOUS

## 2022-03-14 MED ORDER — OXYCODONE HCL 5 MG PO TABS: ORAL_TABLET | ORAL | 0 refills | Status: AC

## 2022-03-14 MED ORDER — LIDOCAINE 2% (20 MG/ML) 5 ML SYRINGE
INTRAMUSCULAR | Status: AC
  Filled 2022-03-14: qty 5

## 2022-03-14 MED ORDER — ASPIRIN 81 MG PO CHEW: 81.0000 mg | CHEWABLE_TABLET | Freq: Two times a day (BID) | ORAL | 0 refills | Status: AC

## 2022-03-14 MED ORDER — ONDANSETRON HCL 4 MG/2ML IJ SOLN
INTRAMUSCULAR | Status: AC
  Filled 2022-03-14: qty 2

## 2022-03-14 MED ORDER — HYDROMORPHONE HCL 1 MG/ML IJ SOLN
0.2500 mg | INTRAMUSCULAR | Status: DC | PRN
  Administered 2022-03-14 (×3): 0.5 mg via INTRAVENOUS

## 2022-03-14 MED ORDER — ACETAMINOPHEN 500 MG PO TABS
ORAL_TABLET | ORAL | Status: AC
  Filled 2022-03-14: qty 2

## 2022-03-14 MED ORDER — DEXAMETHASONE SODIUM PHOSPHATE 10 MG/ML IJ SOLN
INTRAMUSCULAR | Status: AC
  Filled 2022-03-14: qty 1

## 2022-03-14 MED ORDER — PROPOFOL 10 MG/ML IV BOLUS
INTRAVENOUS | Status: AC
  Filled 2022-03-14: qty 20

## 2022-03-14 MED ORDER — KETOROLAC TROMETHAMINE 30 MG/ML IJ SOLN
INTRAMUSCULAR | Status: AC
  Filled 2022-03-14: qty 1

## 2022-03-14 MED ORDER — FENTANYL CITRATE (PF) 100 MCG/2ML IJ SOLN
INTRAMUSCULAR | Status: DC | PRN
  Administered 2022-03-14 (×2): 50 ug via INTRAVENOUS

## 2022-03-14 MED ORDER — KETOROLAC TROMETHAMINE 30 MG/ML IJ SOLN
INTRAMUSCULAR | Status: DC | PRN
  Administered 2022-03-14: 30 mg via INTRAVENOUS

## 2022-03-14 MED ORDER — SODIUM CHLORIDE 0.9 % IR SOLN
Status: DC | PRN
  Administered 2022-03-14: 9000 mL

## 2022-03-14 MED ORDER — MIDAZOLAM HCL 2 MG/2ML IJ SOLN
INTRAMUSCULAR | Status: AC
  Filled 2022-03-14: qty 2

## 2022-03-14 MED ORDER — DEXAMETHASONE SODIUM PHOSPHATE 10 MG/ML IJ SOLN
INTRAMUSCULAR | Status: DC | PRN
  Administered 2022-03-14: 10 mg via INTRAVENOUS

## 2022-03-14 MED ORDER — LACTATED RINGERS IV SOLN: INTRAVENOUS | Status: DC

## 2022-03-14 MED ORDER — MELOXICAM 15 MG PO TABS: 15.0000 mg | ORAL_TABLET | Freq: Every day | ORAL | 0 refills | Status: AC

## 2022-03-14 MED ORDER — FENTANYL CITRATE (PF) 100 MCG/2ML IJ SOLN
INTRAMUSCULAR | Status: AC
  Filled 2022-03-14: qty 2

## 2022-03-14 MED ORDER — OXYCODONE HCL 5 MG/5ML PO SOLN: 5.0000 mg | Freq: Once | ORAL | Status: AC | PRN

## 2022-03-14 MED ORDER — GABAPENTIN 300 MG PO CAPS
300.0000 mg | ORAL_CAPSULE | Freq: Once | ORAL | Status: AC
  Administered 2022-03-14: 300 mg via ORAL

## 2022-03-14 MED ORDER — OXYCODONE HCL 5 MG PO TABS
ORAL_TABLET | ORAL | Status: AC
  Filled 2022-03-14: qty 1

## 2022-03-14 SURGICAL SUPPLY — 41 items
APL PRP STRL LF DISP 70% ISPRP (MISCELLANEOUS) ×1
BANDAGE ESMARK 6X9 LF (GAUZE/BANDAGES/DRESSINGS) IMPLANT
BNDG CMPR 9X6 STRL LF SNTH (GAUZE/BANDAGES/DRESSINGS)
BNDG ELASTIC 6X5.8 VLCR STR LF (GAUZE/BANDAGES/DRESSINGS) ×2 IMPLANT
BNDG ESMARK 6X9 LF (GAUZE/BANDAGES/DRESSINGS)
CHLORAPREP W/TINT 26 (MISCELLANEOUS) ×2 IMPLANT
CLSR STERI-STRIP ANTIMIC 1/2X4 (GAUZE/BANDAGES/DRESSINGS) ×2 IMPLANT
COOLER ICEMAN CLASSIC (MISCELLANEOUS) ×1 IMPLANT
CUFF TOURN SGL QUICK 34 (TOURNIQUET CUFF)
CUFF TRNQT CYL 34X4.125X (TOURNIQUET CUFF) ×1 IMPLANT
DISSECTOR 4.0MMX13CM CVD (MISCELLANEOUS) ×2 IMPLANT
DRAPE ARTHROSCOPY W/POUCH 90 (DRAPES) ×2 IMPLANT
DRAPE IMP U-DRAPE 54X76 (DRAPES) ×2 IMPLANT
DRAPE U-SHAPE 47X51 STRL (DRAPES) ×2 IMPLANT
DRSG PAD ABDOMINAL 8X10 ST (GAUZE/BANDAGES/DRESSINGS) ×1 IMPLANT
GAUZE SPONGE 4X4 12PLY STRL (GAUZE/BANDAGES/DRESSINGS) ×2 IMPLANT
GLOVE BIO SURGEON STRL SZ 6.5 (GLOVE) ×2 IMPLANT
GLOVE BIOGEL PI IND STRL 6.5 (GLOVE) ×1 IMPLANT
GLOVE BIOGEL PI IND STRL 8 (GLOVE) ×1 IMPLANT
GLOVE BIOGEL PI INDICATOR 6.5 (GLOVE) ×1
GLOVE BIOGEL PI INDICATOR 8 (GLOVE) ×1
GLOVE ECLIPSE 8.0 STRL XLNG CF (GLOVE) ×4 IMPLANT
GOWN STRL REUS W/ TWL LRG LVL3 (GOWN DISPOSABLE) ×1 IMPLANT
GOWN STRL REUS W/TWL LRG LVL3 (GOWN DISPOSABLE) ×2
GOWN STRL REUS W/TWL XL LVL3 (GOWN DISPOSABLE) ×2 IMPLANT
IMMOBILIZER KNEE 22 UNIV (SOFTGOODS) ×1 IMPLANT
KIT SUTLOC MENISCAL ROOT REP (Anchor) ×1 IMPLANT
KIT TURNOVER KIT B (KITS) ×2 IMPLANT
MANIFOLD NEPTUNE II (INSTRUMENTS) IMPLANT
NS IRRIG 1000ML POUR BTL (IV SOLUTION) IMPLANT
PACK ARTHROSCOPY DSU (CUSTOM PROCEDURE TRAY) ×2 IMPLANT
PAD COLD SHLDR WRAP-ON (PAD) ×1 IMPLANT
PORT APPOLLO RF 90DEGREE MULTI (SURGICAL WAND) IMPLANT
SLEEVE SCD COMPRESS KNEE MED (STOCKING) ×2 IMPLANT
SUT MNCRL AB 4-0 PS2 18 (SUTURE) ×2 IMPLANT
SUT VIC AB 3-0 SH 27 (SUTURE) ×2
SUT VIC AB 3-0 SH 27X BRD (SUTURE) IMPLANT
TOWEL GREEN STERILE FF (TOWEL DISPOSABLE) ×2 IMPLANT
TUBE CONNECTING 20X1/4 (TUBING) ×2 IMPLANT
TUBING ARTHROSCOPY IRRIG 16FT (MISCELLANEOUS) ×2 IMPLANT
WATER STERILE IRR 1000ML POUR (IV SOLUTION) ×2 IMPLANT

## 2022-03-14 NOTE — Anesthesia Procedure Notes (Signed)
Procedure Name: LMA Insertion Date/Time: 03/14/2022 12:06 PM  Performed by: Lavonia Dana, CRNAPre-anesthesia Checklist: Patient identified, Emergency Drugs available, Suction available and Patient being monitored Patient Re-evaluated:Patient Re-evaluated prior to induction Oxygen Delivery Method: Circle system utilized Preoxygenation: Pre-oxygenation with 100% oxygen Induction Type: IV induction Ventilation: Mask ventilation without difficulty LMA: LMA inserted LMA Size: 4.0 Number of attempts: 1 Airway Equipment and Method: Bite block Placement Confirmation: positive ETCO2 Tube secured with: Tape Dental Injury: Teeth and Oropharynx as per pre-operative assessment

## 2022-03-14 NOTE — Interval H&P Note (Signed)
All questions answered, patient wants to proceed with procedure. ? ?

## 2022-03-14 NOTE — Anesthesia Preprocedure Evaluation (Addendum)
Anesthesia Evaluation  Patient identified by MRN, date of birth, ID band Patient awake    Reviewed: Allergy & Precautions, NPO status , Patient's Chart, lab work & pertinent test results  History of Anesthesia Complications (+) PONV and history of anesthetic complications  Airway Mallampati: II  TM Distance: >3 FB Neck ROM: Limited    Dental no notable dental hx. (+) Dental Advisory Given, Caps, Teeth Intact   Pulmonary asthma ,    Pulmonary exam normal breath sounds clear to auscultation       Cardiovascular hypertension, Pt. on medications Normal cardiovascular exam+ dysrhythmias Supra Ventricular Tachycardia  Rhythm:Regular Rate:Normal  S/P RFA 2014 for SVT   Neuro/Psych PSYCHIATRIC DISORDERS Anxiety Depression Chronic pain syndrome  Neuromuscular disease    GI/Hepatic Neg liver ROS, GERD  Medicated and Controlled,  Endo/Other  Morbid obesity  Renal/GU negative Renal ROS  negative genitourinary   Musculoskeletal  (+) Arthritis , Osteoarthritis,  TMM right knee Ankylosing spondylitis   Abdominal (+) + obese,   Peds  Hematology   Anesthesia Other Findings   Reproductive/Obstetrics                            Anesthesia Physical Anesthesia Plan  ASA: 3  Anesthesia Plan: General   Post-op Pain Management: Minimal or no pain anticipated   Induction: Intravenous  PONV Risk Score and Plan: 4 or greater and Treatment may vary due to age or medical condition, Ondansetron and Dexamethasone  Airway Management Planned: LMA  Additional Equipment: None  Intra-op Plan:   Post-operative Plan: Extubation in OR  Informed Consent: I have reviewed the patients History and Physical, chart, labs and discussed the procedure including the risks, benefits and alternatives for the proposed anesthesia with the patient or authorized representative who has indicated his/her understanding and acceptance.      Dental advisory given  Plan Discussed with: Anesthesiologist and CRNA  Anesthesia Plan Comments:         Anesthesia Quick Evaluation

## 2022-03-14 NOTE — Transfer of Care (Signed)
Immediate Anesthesia Transfer of Care Note  Patient: Tracy Landry  Procedure(s) Performed: KNEE ARTHROSCOPY WITH MEDIAL MENISCAL ROOT REPAIR (Right: Knee)  Patient Location: PACU  Anesthesia Type:General  Level of Consciousness: drowsy  Airway & Oxygen Therapy: Patient Spontanous Breathing and Patient connected to face mask oxygen  Post-op Assessment: Report given to RN and Post -op Vital signs reviewed and stable  Post vital signs: Reviewed and stable  Last Vitals:  Vitals Value Taken Time  BP 154/72 03/14/22 1303  Temp    Pulse 89 03/14/22 1305  Resp 34 03/14/22 1305  SpO2 100 % 03/14/22 1305  Vitals shown include unvalidated device data.  Last Pain:  Vitals:   03/14/22 1050  TempSrc: Oral  PainSc: 4          Complications: No notable events documented.

## 2022-03-14 NOTE — Anesthesia Postprocedure Evaluation (Signed)
Anesthesia Post Note  Patient: Tracy Landry  Procedure(s) Performed: KNEE ARTHROSCOPY WITH MEDIAL MENISCAL ROOT REPAIR (Right: Knee)     Patient location during evaluation: PACU Anesthesia Type: General Level of consciousness: awake and alert and oriented Pain management: pain level controlled Vital Signs Assessment: post-procedure vital signs reviewed and stable Respiratory status: spontaneous breathing, nonlabored ventilation and respiratory function stable Cardiovascular status: blood pressure returned to baseline and stable Postop Assessment: no apparent nausea or vomiting Anesthetic complications: no   No notable events documented.  Last Vitals:  Vitals:   03/14/22 1330 03/14/22 1355  BP: 122/70 (!) 130/96  Pulse: 74 89  Resp: 13 18  Temp:  36.8 C  SpO2: 100% 93%    Last Pain:  Vitals:   03/14/22 1403  TempSrc:   PainSc: 7                  Myrtle Barnhard A.

## 2022-03-14 NOTE — Op Note (Signed)
Orthopaedic Surgery Operative Note (CSN: [id number])  Tracy Landry  June 17, 1961 Date of Surgery: 03/14/2022   Diagnoses:  Right knee medial meniscal root tear  Procedure: Right knee medial meniscal root repair Loose body excision x 2   Operative Finding Patient had essentially a normal lateral compartment though there is some mild softening of the tibia.  Patellofemoral joint was bone-on-bone with grade 4 changes on both the patella and the femur.  There were 2 cartilaginous loose bodies in the suprapatellar pouch removed.  1 x 1 cm.  The patient's medial compartment had a central area of grade 2 and nearing grade 3 cartilage loss however the peripheral cartilage was all intact.  This area was only about 1 x 1 cm in size.  This was clearly the point of overload at the distal aspect of the femur.  The patient had a essentially normal meniscus with the exception of a posterior medial meniscal root tear which was destabilizing the meniscus.  This specific patient had a lengthy and traumatic history in regards to arthroplasty on her left knee.  She knew that her goals were limited however in light of the patient's expectations and desires to avoid arthroplasty as well as a discussion with her wife intraoperatively we felt that repair would be reasonable to consider to try and slow the progression of arthritis.  Her medial compartment was not as severely affected as we expected based on the MRI and x-ray.  She does have a risk of not healing this and needing to progress to arthroplasty however we feel it is more likely than not that she will heal her meniscal root repair.   Post-operative plan: The patient will be touchdown weightbearing for 3 weeks following her meniscal root repair protocol.  The patient will be discharged home.  DVT prophylaxis Aspirin 81 mg twice daily for 6 weeks.  Pain control with PRN pain medication preferring oral medicines.  Follow up plan will be scheduled in approximately  7 days for incision check and XR.  Post-Op Diagnosis: Same Surgeons:Primary: Hiram Gash, MD Assistants:Caroline McBane PA-C Location: [facility] ROOM 1 Anesthesia: General with local Antibiotics: Ancef 2 g Tourniquet time: None Estimated Blood Loss: Minimal Complications: None Specimens: None Implants: Implant Name Type Inv. Item Serial No. Manufacturer Lot No. LRB No. Used Action  sutureloc meniscal root repair Orthopedic Implant   [name] [id number] Right 1 Implanted    Indications for Surgery:   Tracy Landry is a 61 y.o. female with medial meniscal root tear in the setting of a previous long history of contralateral knee arthroplasty and revisions.  Benefits and risks of operative and nonoperative management were discussed prior to surgery with patient/guardian(s) and informed consent form was completed.  Specific risks including infection, need for additional surgery, postop arthrosis, loss of fixation and continued pain amongst others.   Procedure:   The patient was identified properly. Informed consent was obtained and the surgical site was marked. The patient was taken up to suite where general anesthesia was induced. The patient was placed in the supine position with a post against the surgical leg and a nonsterile tourniquet applied. The surgical leg was then prepped and draped usual sterile fashion.  A standard surgical timeout was performed.  2 standard anterior portals were made and diagnostic arthroscopy performed. Please note the findings as noted above.  2 cartilaginous loose bodies removed from the suprapatellar pouch, 1 x 1 cm.  We cleared the joint as above.  We identified  the medial meniscal root tear and felt that it was appropriate for repair.  We trephinated the MCL to allow better visualization without damaging cartilage.  At this point we used a shaver to decorticate at the site of the meniscal root posteriorly.  We then used an Arthrex guide to place a drill  pin that was cannulated at the location of the root.  Once that was complete we shuttled our suture wire and our suture lock meniscal root repair system.  We are able to shuttle the sutures through the medial portal down into the tibia.  Once the anchor portion had entered the tibial cortex we set the anchor as instructed by Arthrex.  At this point we shuttled our sutures from the medial portal to the lateral portal and then placed a passport cannula in the medial portal.  We then systematically used a meniscal scorpion to pass our working limb of the sutures each through the meniscus and in appropriate position obtaining good purchase of the tissue.  We then shuttled each corresponding limb through the suture lock mechanism.  We were careful not to over tension the first suture to preserve view for our second pass.  Under fluoroscopic guidance we then sequentially tightened and reduced the meniscus to our decorticated bed.  We had an anatomic reduction of the meniscus.  Final construct was quite robust.  We cut the residual sutures at the anterior medial tibial cortex.  We irrigated copiously.  Incisions closed with absorbable suture. The patient was awoken from general anesthesia and taken to the PACU in stable condition without complication.   Noemi Chapel, PA-C, present and scrubbed throughout the case, critical for completion in a timely fashion, and for retraction, instrumentation, closure.

## 2022-03-15 ENCOUNTER — Other Ambulatory Visit: Payer: Self-pay

## 2022-03-15 ENCOUNTER — Encounter (HOSPITAL_BASED_OUTPATIENT_CLINIC_OR_DEPARTMENT_OTHER): Payer: Self-pay | Admitting: Orthopaedic Surgery

## 2022-03-18 DIAGNOSIS — M25661 Stiffness of right knee, not elsewhere classified: Secondary | ICD-10-CM | POA: Diagnosis not present

## 2022-03-18 DIAGNOSIS — R262 Difficulty in walking, not elsewhere classified: Secondary | ICD-10-CM | POA: Diagnosis not present

## 2022-03-18 DIAGNOSIS — S83231D Complex tear of medial meniscus, current injury, right knee, subsequent encounter: Secondary | ICD-10-CM | POA: Diagnosis not present

## 2022-03-18 DIAGNOSIS — M6281 Muscle weakness (generalized): Secondary | ICD-10-CM | POA: Diagnosis not present

## 2022-03-21 DIAGNOSIS — S83231D Complex tear of medial meniscus, current injury, right knee, subsequent encounter: Secondary | ICD-10-CM | POA: Diagnosis not present

## 2022-03-22 NOTE — Telephone Encounter (Signed)
Auth obtained: Case # [id number] Good from 02/28/22 through 02/28/23  Patient aware of the approval and her out-of-pocket cost. She stated that she just had surgery on her knee and would be out of commission for some time before she can do the gel injections. Also, the deductible should be met after applying the surgery costs. Advised patient I would re-run her out-of-pocket in 6 weeks. She is agreeable to this. Reminder sent for 6 weeks.

## 2022-03-23 LAB — COLOGUARD: COLOGUARD: NEGATIVE

## 2022-03-25 DIAGNOSIS — R262 Difficulty in walking, not elsewhere classified: Secondary | ICD-10-CM | POA: Diagnosis not present

## 2022-03-25 DIAGNOSIS — S83231D Complex tear of medial meniscus, current injury, right knee, subsequent encounter: Secondary | ICD-10-CM | POA: Diagnosis not present

## 2022-03-25 DIAGNOSIS — M6281 Muscle weakness (generalized): Secondary | ICD-10-CM | POA: Diagnosis not present

## 2022-03-25 DIAGNOSIS — M25661 Stiffness of right knee, not elsewhere classified: Secondary | ICD-10-CM | POA: Diagnosis not present

## 2022-04-01 DIAGNOSIS — M25661 Stiffness of right knee, not elsewhere classified: Secondary | ICD-10-CM | POA: Diagnosis not present

## 2022-04-01 DIAGNOSIS — R262 Difficulty in walking, not elsewhere classified: Secondary | ICD-10-CM | POA: Diagnosis not present

## 2022-04-01 DIAGNOSIS — S83231D Complex tear of medial meniscus, current injury, right knee, subsequent encounter: Secondary | ICD-10-CM | POA: Diagnosis not present

## 2022-04-01 DIAGNOSIS — M6281 Muscle weakness (generalized): Secondary | ICD-10-CM | POA: Diagnosis not present

## 2022-04-09 DIAGNOSIS — R262 Difficulty in walking, not elsewhere classified: Secondary | ICD-10-CM | POA: Diagnosis not present

## 2022-04-09 DIAGNOSIS — M6281 Muscle weakness (generalized): Secondary | ICD-10-CM | POA: Diagnosis not present

## 2022-04-09 DIAGNOSIS — S83231D Complex tear of medial meniscus, current injury, right knee, subsequent encounter: Secondary | ICD-10-CM | POA: Diagnosis not present

## 2022-04-09 DIAGNOSIS — M25661 Stiffness of right knee, not elsewhere classified: Secondary | ICD-10-CM | POA: Diagnosis not present

## 2022-04-15 DIAGNOSIS — M6281 Muscle weakness (generalized): Secondary | ICD-10-CM | POA: Diagnosis not present

## 2022-04-15 DIAGNOSIS — M25661 Stiffness of right knee, not elsewhere classified: Secondary | ICD-10-CM | POA: Diagnosis not present

## 2022-04-15 DIAGNOSIS — R262 Difficulty in walking, not elsewhere classified: Secondary | ICD-10-CM | POA: Diagnosis not present

## 2022-04-15 DIAGNOSIS — S83231D Complex tear of medial meniscus, current injury, right knee, subsequent encounter: Secondary | ICD-10-CM | POA: Diagnosis not present

## 2022-04-17 ENCOUNTER — Other Ambulatory Visit: Payer: Self-pay | Admitting: Physician Assistant

## 2022-04-25 DIAGNOSIS — S83231D Complex tear of medial meniscus, current injury, right knee, subsequent encounter: Secondary | ICD-10-CM | POA: Diagnosis not present

## 2022-04-26 DIAGNOSIS — S83231D Complex tear of medial meniscus, current injury, right knee, subsequent encounter: Secondary | ICD-10-CM | POA: Diagnosis not present

## 2022-04-26 DIAGNOSIS — R262 Difficulty in walking, not elsewhere classified: Secondary | ICD-10-CM | POA: Diagnosis not present

## 2022-04-26 DIAGNOSIS — M6281 Muscle weakness (generalized): Secondary | ICD-10-CM | POA: Diagnosis not present

## 2022-04-26 DIAGNOSIS — M25661 Stiffness of right knee, not elsewhere classified: Secondary | ICD-10-CM | POA: Diagnosis not present

## 2022-04-28 ENCOUNTER — Other Ambulatory Visit: Payer: Self-pay | Admitting: Physician Assistant

## 2022-04-28 DIAGNOSIS — G894 Chronic pain syndrome: Secondary | ICD-10-CM

## 2022-04-28 DIAGNOSIS — F331 Major depressive disorder, recurrent, moderate: Secondary | ICD-10-CM

## 2022-04-29 ENCOUNTER — Other Ambulatory Visit: Payer: Self-pay | Admitting: Physician Assistant

## 2022-04-29 DIAGNOSIS — G4700 Insomnia, unspecified: Secondary | ICD-10-CM

## 2022-04-30 DIAGNOSIS — M6281 Muscle weakness (generalized): Secondary | ICD-10-CM | POA: Diagnosis not present

## 2022-04-30 DIAGNOSIS — S83231D Complex tear of medial meniscus, current injury, right knee, subsequent encounter: Secondary | ICD-10-CM | POA: Diagnosis not present

## 2022-04-30 DIAGNOSIS — R262 Difficulty in walking, not elsewhere classified: Secondary | ICD-10-CM | POA: Diagnosis not present

## 2022-04-30 DIAGNOSIS — M25661 Stiffness of right knee, not elsewhere classified: Secondary | ICD-10-CM | POA: Diagnosis not present

## 2022-05-06 DIAGNOSIS — R262 Difficulty in walking, not elsewhere classified: Secondary | ICD-10-CM | POA: Diagnosis not present

## 2022-05-06 DIAGNOSIS — S83231D Complex tear of medial meniscus, current injury, right knee, subsequent encounter: Secondary | ICD-10-CM | POA: Diagnosis not present

## 2022-05-06 DIAGNOSIS — M6281 Muscle weakness (generalized): Secondary | ICD-10-CM | POA: Diagnosis not present

## 2022-05-06 DIAGNOSIS — M25661 Stiffness of right knee, not elsewhere classified: Secondary | ICD-10-CM | POA: Diagnosis not present

## 2022-05-07 NOTE — Telephone Encounter (Signed)
Re-ran benefits as requested  Benefits Investigation Details received from [employer] Injection: Orthovisc  Medical: Deductible applies. Since the deductible has been met, pt is responsible for coinsurance. Once the OOP has been met, pt is covered at 100%.  PA required: Yes  PA already obtained; please see below.   Pharmacy: Product not covered under pharmacy plan.   Specialty [facility]  May fill through: [employer] Copay/Coinsurance: $40 Product Copay: 20% ($140) Administration Coinsurance: 20% ($26) Administration Copay:  Deductible: $1500 (met: $1500) Out of Pocket Max: $7000 (met: $3710.70)    Pt's approx cost for injection is $206.   Left msg for a return call from patient.

## 2022-05-14 ENCOUNTER — Other Ambulatory Visit: Payer: Self-pay | Admitting: Physician Assistant

## 2022-05-14 DIAGNOSIS — G894 Chronic pain syndrome: Secondary | ICD-10-CM

## 2022-05-14 DIAGNOSIS — M25661 Stiffness of right knee, not elsewhere classified: Secondary | ICD-10-CM | POA: Diagnosis not present

## 2022-05-14 DIAGNOSIS — R262 Difficulty in walking, not elsewhere classified: Secondary | ICD-10-CM | POA: Diagnosis not present

## 2022-05-14 DIAGNOSIS — M6281 Muscle weakness (generalized): Secondary | ICD-10-CM | POA: Diagnosis not present

## 2022-05-14 DIAGNOSIS — S83231D Complex tear of medial meniscus, current injury, right knee, subsequent encounter: Secondary | ICD-10-CM | POA: Diagnosis not present

## 2022-05-23 DIAGNOSIS — R262 Difficulty in walking, not elsewhere classified: Secondary | ICD-10-CM | POA: Diagnosis not present

## 2022-05-23 DIAGNOSIS — M6281 Muscle weakness (generalized): Secondary | ICD-10-CM | POA: Diagnosis not present

## 2022-05-23 DIAGNOSIS — M25661 Stiffness of right knee, not elsewhere classified: Secondary | ICD-10-CM | POA: Diagnosis not present

## 2022-05-23 DIAGNOSIS — S83231D Complex tear of medial meniscus, current injury, right knee, subsequent encounter: Secondary | ICD-10-CM | POA: Diagnosis not present

## 2022-05-28 DIAGNOSIS — S83231D Complex tear of medial meniscus, current injury, right knee, subsequent encounter: Secondary | ICD-10-CM | POA: Diagnosis not present

## 2022-05-28 DIAGNOSIS — R262 Difficulty in walking, not elsewhere classified: Secondary | ICD-10-CM | POA: Diagnosis not present

## 2022-05-28 DIAGNOSIS — M6281 Muscle weakness (generalized): Secondary | ICD-10-CM | POA: Diagnosis not present

## 2022-05-28 DIAGNOSIS — M25661 Stiffness of right knee, not elsewhere classified: Secondary | ICD-10-CM | POA: Diagnosis not present

## 2022-06-04 DIAGNOSIS — R262 Difficulty in walking, not elsewhere classified: Secondary | ICD-10-CM | POA: Diagnosis not present

## 2022-06-04 DIAGNOSIS — M6281 Muscle weakness (generalized): Secondary | ICD-10-CM | POA: Diagnosis not present

## 2022-06-04 DIAGNOSIS — M25661 Stiffness of right knee, not elsewhere classified: Secondary | ICD-10-CM | POA: Diagnosis not present

## 2022-06-04 DIAGNOSIS — S83231D Complex tear of medial meniscus, current injury, right knee, subsequent encounter: Secondary | ICD-10-CM | POA: Diagnosis not present

## 2022-06-11 DIAGNOSIS — S83231D Complex tear of medial meniscus, current injury, right knee, subsequent encounter: Secondary | ICD-10-CM | POA: Diagnosis not present

## 2022-06-16 ENCOUNTER — Other Ambulatory Visit: Payer: Self-pay | Admitting: Family Medicine

## 2022-06-16 DIAGNOSIS — G894 Chronic pain syndrome: Secondary | ICD-10-CM

## 2022-07-01 DIAGNOSIS — M948X6 Other specified disorders of cartilage, lower leg: Secondary | ICD-10-CM | POA: Diagnosis not present

## 2022-07-01 DIAGNOSIS — X58XXXA Exposure to other specified factors, initial encounter: Secondary | ICD-10-CM | POA: Diagnosis not present

## 2022-07-01 DIAGNOSIS — S83241A Other tear of medial meniscus, current injury, right knee, initial encounter: Secondary | ICD-10-CM | POA: Diagnosis not present

## 2022-07-01 DIAGNOSIS — Y999 Unspecified external cause status: Secondary | ICD-10-CM | POA: Diagnosis not present

## 2022-07-08 DIAGNOSIS — S83231D Complex tear of medial meniscus, current injury, right knee, subsequent encounter: Secondary | ICD-10-CM | POA: Diagnosis not present

## 2022-07-08 DIAGNOSIS — M25661 Stiffness of right knee, not elsewhere classified: Secondary | ICD-10-CM | POA: Diagnosis not present

## 2022-07-08 DIAGNOSIS — M6281 Muscle weakness (generalized): Secondary | ICD-10-CM | POA: Diagnosis not present

## 2022-07-08 DIAGNOSIS — R262 Difficulty in walking, not elsewhere classified: Secondary | ICD-10-CM | POA: Diagnosis not present

## 2022-07-16 ENCOUNTER — Other Ambulatory Visit: Payer: Self-pay | Admitting: Physician Assistant

## 2022-07-19 ENCOUNTER — Other Ambulatory Visit: Payer: Self-pay | Admitting: Family Medicine

## 2022-07-19 DIAGNOSIS — G894 Chronic pain syndrome: Secondary | ICD-10-CM

## 2022-07-22 ENCOUNTER — Ambulatory Visit
Admission: RE | Admit: 2022-07-22 | Discharge: 2022-07-22 | Disposition: A | Discharge status: Home / Self Care | Payer: 59 | Source: Ambulatory Visit | Attending: Family Medicine | Admitting: Family Medicine

## 2022-07-22 ENCOUNTER — Encounter: Payer: Self-pay | Admitting: Physician Assistant

## 2022-07-22 VITALS — BP 146/95 | HR 89 | Temp 98.9°F | Resp 17

## 2022-07-22 DIAGNOSIS — J4541 Moderate persistent asthma with (acute) exacerbation: Secondary | ICD-10-CM | POA: Diagnosis not present

## 2022-07-22 DIAGNOSIS — J4520 Mild intermittent asthma, uncomplicated: Secondary | ICD-10-CM

## 2022-07-22 DIAGNOSIS — J40 Bronchitis, not specified as acute or chronic: Secondary | ICD-10-CM

## 2022-07-22 MED ORDER — BENZONATATE 200 MG PO CAPS: 200.0000 mg | ORAL_CAPSULE | Freq: Three times a day (TID) | ORAL | 0 refills | Status: DC | PRN

## 2022-07-22 MED ORDER — DOXYCYCLINE HYCLATE 100 MG PO CAPS: 100.0000 mg | ORAL_CAPSULE | Freq: Two times a day (BID) | ORAL | 0 refills | Status: DC

## 2022-07-22 MED ORDER — METHYLPREDNISOLONE ACETATE 80 MG/ML IJ SUSP
80.0000 mg | Freq: Once | INTRAMUSCULAR | Status: AC
  Administered 2022-07-22: 80 mg via INTRAMUSCULAR

## 2022-07-22 NOTE — ED Triage Notes (Signed)
Pt c/o cough, congestion and wheezing since Friday night. Hx of bronchitis. Low grade fever of 99.2. Taking mucinex robitussin and tylenol prn.

## 2022-07-22 NOTE — ED Provider Notes (Signed)
[facility]    CSN: [id number] Arrival date & time: 07/22/22  1010      History   Chief Complaint Chief Complaint  Patient presents with   Cough   Nasal Congestion   Wheezing    HPI Tracy Landry is a 61 y.o. female.   HPI  Patient has asthma.  Frequent respiratory infection.  States she requires antibiotics to recover from respiratory infections.  She has cough congestion wheezing since Friday.  She states she has had a low-grade fever, body aches and headache.  She is taking Mucinex Robitussin and Tylenol.  Her wife is sick as well.  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   Wears glasses     Patient Active Problem List   Diagnosis Date Noted   Insomnia 03/11/2022   Insufficiency fracture of medial femoral condyle ([place]) 01/30/2022   Strain of rhomboid muscle 03/12/2021   Moderate episode of recurrent major depressive disorder (HCC) 03/12/2021   Chronic pain syndrome 03/12/2021   Non-restorative sleep 03/12/2021   Snoring 03/12/2021   S/P TKR (total knee replacement) using cement, left 05/07/2019   Impaired fasting glucose 04/02/2019   Family history of diabetes mellitus in mother 04/01/2019   Acute medial meniscus tear of left knee 03/26/2019   Arthritis of knee, left 03/15/2019   Dupuytren's disease 09/30/2018   Primary osteoarthritis of first carpometacarpal joint of right hand 03/26/2018   Periscapular pain 06/03/2016   Primary osteoarthritis of right knee, status post left total knee arthroplasty/revision patellectomy 05/08/2016   Low back pain 02/22/2016   Anxiety 03/28/2015   Plantar fasciitis, left 06/09/2014   Primary osteoarthritis, right shoulder 02/03/2014   Essential hypertension 07/15/2012   SVT (supraventricular  tachycardia) 04/15/2012   Fatigue 04/15/2012   Depression 03/15/2011   COUGH 05/15/2010   ANKYLOSING SPONDYLITIS 01/06/2007   HYPERLIPIDEMIA 08/22/2006   ASTHMA, PERSISTENT, MILD 08/12/2006   Asthma 08/12/2006   Class 2 severe obesity due to excess calories with serious comorbidity and body mass index (BMI) of 37.0 to 37.9 in adult ([place]) 06/01/2006   GASTROESOPHAGEAL REFLUX, NO ESOPHAGITIS 05/13/2006    Past Surgical History:  Procedure Laterality Date   ABLATION OF DYSRHYTHMIC FOCUS  10/07/2012   SVT   APPENDECTOMY     FOOT SURGERY  2000   rt foot fx   HARDWARE REMOVAL  2003   rt arm   KNEE ARTHROSCOPY WITH MEDIAL MENISECTOMY Right 03/14/2022   Procedure: KNEE ARTHROSCOPY WITH MEDIAL MENISCAL ROOT REPAIR;  Surgeon: Hiram Gash, MD;  Location: [facility];  Service: Orthopedics;  Laterality: Right;   MASS EXCISION Left 07/07/2013   Procedure: EXCISION MASS LEFT RING FINGER;  Surgeon: Wynonia Sours, MD;  Location: [facility];  Service: Orthopedics;  Laterality: Left;   PATELLECTOMY     REPAIR EXTENSOR TENDON Left 07/07/2013   Procedure: REPAIR EXTENSOR TENDON LEFT WITH DEBRIDMENT PROXIMAL INTERPHALANGEAL JOINT;  Surgeon: Wynonia Sours, MD;  Location: [facility];  Service: Orthopedics;  Laterality: Left;   right arm reconstruction  08/1999   SUPRAVENTRICULAR TACHYCARDIA ABLATION N/A 10/07/2012   Procedure: SUPRAVENTRICULAR TACHYCARDIA ABLATION;  Surgeon: Evans Lance, MD;  Location[facility] C[facility] LAB;  Service: Cardiovascular;  Laterality:  N/A;   TOTAL ABDOMINAL HYSTERECTOMY  10/2007   TOTAL KNEE ARTHROPLASTY      OB History     Gravida  0   Para  0   Term  0   Preterm  0   AB  0   Living  0      SAB  0   IAB  0   Ectopic  0   Multiple  0   Live Births  0            Home Medications    Prior to Admission medications   Medication Sig Start Date End Date Taking? Authorizing Provider  benzonatate (TESSALON) 200  MG capsule Take 1 capsule (200 mg total) by mouth 3 (three) times daily as needed for cough. 07/22/22  Yes Raylene Everts, MD  doxycycline (VIBRAMYCIN) 100 MG capsule Take 1 capsule (100 mg total) by mouth 2 (two) times daily. 07/22/22  Yes Raylene Everts, MD  albuterol (VENTOLIN HFA) 108 (90 Base) MCG/ACT inhaler Inhale 2 puffs into the lungs every 6 (six) hours as needed. 03/09/21   Breeback, Jade L, PA-C  cyclobenzaprine (FLEXERIL) 10 MG tablet TAKE 1 TABLET BY MOUTH AT BEDTIME 11/27/21   Breeback, Jade L, PA-C  esomeprazole (NEXIUM) 20 MG capsule Take by mouth.    [provider]  gabapentin (NEURONTIN) 100 MG capsule TAKE 1 TO 2 CAPSULES BY MOUTH THREE TIMES DAILY 06/17/22   Hali Marry, MD  montelukast (SINGULAIR) 10 MG tablet TAKE 1 TABLET BY MOUTH AT BEDTIME 07/17/22   Breeback, Jade L, PA-C  pravastatin (PRAVACHOL) 20 MG tablet Take 1 tablet by mouth once daily 04/18/22   Breeback, Jade L, PA-C  promethazine (PHENERGAN) 12.5 MG tablet Take 1 tablet (12.5 mg total) by mouth every 6 (six) hours as needed for nausea or vomiting. 03/14/22   Hiram Gash, MD  traZODone (DESYREL) 50 MG tablet TAKE 1/2 TO 1 (ONE-HALF TO ONE) TABLET BY MOUTH AT BEDTIME AS NEEDED FOR SLEEP 04/29/22   Breeback, Jade L, PA-C  venlafaxine XR (EFFEXOR-XR) 150 MG 24 hr capsule TAKE 1 CAPSULE BY MOUTH ONCE DAILY WITH BREAKFAST 04/29/22   Donella Stade, PA-C    Family History Family History  Problem Relation Age of [place]   Alcohol abuse Mother    Diabetes Mother    Hyperlipidemia Mother    Hypertension Mother    Heart disease Mother        Rheumatic disease   Alzheimer's disease Father    Diabetes Sister    Hyperlipidemia Sister    Hypertension Sister    Heart attack Maternal Uncle    Lung cancer Maternal Grandmother        lung   Heart attack Maternal Grandfather     Social History Social History   Tobacco Use   Smoking status: Never   Smokeless tobacco: Never  Vaping Use    Vaping Use: Former  Substance Use Topics   Alcohol use: Yes    Comment: 3 a week   Drug use: No     Allergies   Chlorhexidine gluconate, Amoxicillin-pot clavulanate, and Codeine sulfate   Review of Systems Review of Systems  See HPI Physical Exam Triage Vital Signs ED Triage Vitals  Enc Vitals Group     BP 07/22/22 1018 (!) 146/95     Pulse Rate 07/22/22 1018 89     Resp 07/22/22 1018 17     Temp 07/22/22 1018 98.9 F (37.2 C)  Temp Source 07/22/22 1018 Oral     SpO2 07/22/22 1018 96 %     Weight --      Height --      Head Circumference --      Peak Flow --      Pain Score 07/22/22 1017 0     Pain Loc --      Pain Edu? --      Excl. in [place]? --    No data found.  Updated Vital Signs BP (!) 146/95 (BP Location: Right Arm)   Pulse 89   Temp 98.9 F (37.2 C) (Oral)   Resp 17   LMP 10/28/2006   SpO2 96%      Physical Exam Constitutional:      General: She is not in acute distress.    Appearance: She is well-developed. She is ill-appearing.  HENT:     Head: Normocephalic and atraumatic.     Right Ear: Tympanic membrane and ear canal normal.     Left Ear: Tympanic membrane and ear canal normal.     Nose: Congestion and rhinorrhea present.     Mouth/Throat:     Pharynx: No posterior oropharyngeal erythema.  Eyes:     Conjunctiva/sclera: Conjunctivae normal.     Pupils: Pupils are equal, round, and reactive to light.  Cardiovascular:     Rate and Rhythm: Normal rate and regular rhythm.     Heart sounds: Normal heart sounds.  Pulmonary:     Effort: Pulmonary effort is normal. No respiratory distress.     Breath sounds: Wheezing and rhonchi present.  Abdominal:     General: There is no distension.     Palpations: Abdomen is soft.  Musculoskeletal:        General: Normal range of motion.     Cervical back: Normal range of motion.  Lymphadenopathy:     Cervical: No cervical adenopathy.  Skin:    General: Skin is warm and dry.  Neurological:      Mental Status: She is alert.  Psychiatric:        Mood and Affect: Mood normal.        Behavior: Behavior normal.      UC Treatments / Results  Labs (all labs ordered are listed, but only abnormal results are displayed) Labs Reviewed - No data to display  EKG   Radiology No results found.  Procedures Procedures (including critical care time)  Medications Ordered in UC Medications  methylPREDNISolone acetate (DEPO-MEDROL) injection 80 mg (80 mg Intramuscular Given 07/22/22 1106)    Initial Impression / Assessment and Plan / UC Course  I have reviewed the triage vital signs and the nursing notes.  Pertinent labs & imaging results that were available during my care of the patient were reviewed by me and considered in my medical decision making (see chart for details).     Final Clinical Impressions(s) / UC Diagnoses   Final diagnoses:  Moderate persistent asthma with exacerbation  Bronchitis     Discharge Instructions      Drink lots of water Take the antibiotic 2 times a day.  Is important to take this antibiotic with food Take the Tessalon tablets 2 or 3 times a day to help with cough May take, in addition, Mucinex DM See your doctor if not improving in a week   ED Prescriptions     Medication Sig Dispense Auth. Provider   doxycycline (VIBRAMYCIN) 100 MG capsule Take 1 capsule (100 mg total) by  mouth 2 (two) times daily. 20 capsule Raylene Everts, MD   benzonatate (TESSALON) 200 MG capsule Take 1 capsule (200 mg total) by mouth 3 (three) times daily as needed for cough. 21 capsule Raylene Everts, MD      PDMP not reviewed this encounter.   Raylene Everts, MD 07/22/22 1141

## 2022-07-22 NOTE — Discharge Instructions (Signed)
Drink lots of water Take the antibiotic 2 times a day.  Is important to take this antibiotic with food Take the Tessalon tablets 2 or 3 times a day to help with cough May take, in addition, Mucinex DM See your doctor if not improving in a week

## 2022-07-23 MED ORDER — ALBUTEROL SULFATE HFA 108 (90 BASE) MCG/ACT IN AERS: 2.0000 | INHALATION_SPRAY | Freq: Four times a day (QID) | RESPIRATORY_TRACT | 0 refills | Status: DC | PRN

## 2022-07-24 ENCOUNTER — Ambulatory Visit: Payer: 59 | Admitting: Physician Assistant

## 2022-07-25 ENCOUNTER — Encounter: Payer: Self-pay | Admitting: Physician Assistant

## 2022-08-06 ENCOUNTER — Ambulatory Visit: Payer: Self-pay

## 2022-08-06 ENCOUNTER — Ambulatory Visit (INDEPENDENT_AMBULATORY_CARE_PROVIDER_SITE_OTHER): Payer: 59

## 2022-08-06 ENCOUNTER — Ambulatory Visit: Payer: 59 | Admitting: Sports Medicine

## 2022-08-06 DIAGNOSIS — M1711 Unilateral primary osteoarthritis, right knee: Secondary | ICD-10-CM

## 2022-08-06 DIAGNOSIS — M1811 Unilateral primary osteoarthritis of first carpometacarpal joint, right hand: Secondary | ICD-10-CM

## 2022-08-06 MED ORDER — TRIAMCINOLONE ACETONIDE 40 MG/ML IJ SUSP
40.0000 mg | Freq: Once | INTRAMUSCULAR | Status: AC
  Administered 2022-08-06: 40 mg via INTRAMUSCULAR

## 2022-08-06 MED ORDER — HYALURONAN 30 MG/2ML IX SOSY
30.0000 mg | PREFILLED_SYRINGE | Freq: Once | INTRA_ARTICULAR | Status: AC
  Administered 2022-08-06: 30 mg via INTRA_ARTICULAR

## 2022-08-06 NOTE — Assessment & Plan Note (Signed)
Recurrence of pain right first [facility], last injection was in December 2022, repeated today.

## 2022-08-06 NOTE — Progress Notes (Signed)
    Procedures performed today:    Procedure: Real-time Ultrasound Guided injection of the right knee Device: Samsung HS60  Verbal informed consent obtained.  Time-out conducted.  Noted no overlying erythema, induration, or other signs of local infection.  Skin prepped in a sterile fashion.  Local anesthesia: Topical Ethyl chloride.  With sterile technique and under real time ultrasound guidance: Noted trace effusion 1 cc Kenalog 40, 2 cc lidocaine, 2 cc bupivacaine injected easily, syringe switched and 30 mg/2 mL of OrthoVisc (sodium hyaluronate) in a prefilled syringe was injected easily into the knee through a 22-gauge needle. Completed without difficulty  Advised to call if fevers/chills, erythema, induration, drainage, or persistent bleeding.  Images permanently stored and available for review in PACS.  Impression: Technically successful ultrasound guided injection.  Procedure: Real-time Ultrasound Guided injection of the right first [facility] Device: Samsung HS60  Verbal informed consent obtained.  Time-out conducted.  Noted no overlying erythema, induration, or other signs of local infection.  Skin prepped in a sterile fashion.  Local anesthesia: Topical Ethyl chloride.  With sterile technique and under real time ultrasound guidance: Arthritic joint noted, 1/2 cc lidocaine, 1/2 cc kenalog 40 injected easily. Completed without difficulty  Advised to call if fevers/chills, erythema, induration, drainage, or persistent bleeding.  Images permanently stored and available for review in PACS.  Impression: Technically successful ultrasound guided injection.  Independent interpretation of notes and tests performed by another provider:   None.  Brief History, Exam, Impression, and Recommendations:    Primary osteoarthritis of right knee, status post left total knee arthroplasty/revision patellectomy Tracy Landry returns, she is a pleasant 62 year old female, she has a fairly complex problem with  her right knee, she did have some internal derangements in the form of intra-articular loose bodies, meniscal derangement. She had an arthroscopy not too long ago with Dr. Griffin Basil, loose bodies were removed, meniscal degeneration was addressed, but unfortunately she she had relatively unhealthy cartilage with areas of osteoarthritis.  She understands that arthroscopic debridement cannot address pain from unhealthy cartilage, and that the next step would be to address this. We did get her approved for Orthovisc, approval is good until July 2024 so we will go ahead and start Orthovisc today, I also did a steroid injection today to get her more rapid relief. Return to see me in 1 week for Orthovisc No. 2 of 4 right knee. Of note she is status post left knee arthroplasty with revision patellectomy.  Primary osteoarthritis of first carpometacarpal joint of right hand Recurrence of pain right first [facility], last injection was in December 2022, repeated today.    ____________________________________________ Gwen Her. Dianah Field, M.D., ABFM., CAQSM., AME. Primary Care and Sports Medicine [facility]  Adjunct Professor of [employer] of Medicine  Risk manager

## 2022-08-06 NOTE — Assessment & Plan Note (Signed)
[name] returns, she is a pleasant 62 year old female, she has a fairly complex problem with her right knee, she did have some internal derangements in the form of intra-articular loose bodies, meniscal derangement. She had an arthroscopy not too long ago with Dr. Griffin Basil, loose bodies were removed, meniscal degeneration was addressed, but unfortunately she she had relatively unhealthy cartilage with areas of osteoarthritis.  She understands that arthroscopic debridement cannot address pain from unhealthy cartilage, and that the next step would be to address this. We did get her approved for Orthovisc, approval is good until July 2024 so we will go ahead and start Orthovisc today, I also did a steroid injection today to get her more rapid relief. Return to see me in 1 week for Orthovisc No. 2 of 4 right knee. Of note she is status post left knee arthroplasty with revision patellectomy.

## 2022-08-13 ENCOUNTER — Ambulatory Visit (INDEPENDENT_AMBULATORY_CARE_PROVIDER_SITE_OTHER): Payer: 59

## 2022-08-13 ENCOUNTER — Ambulatory Visit (INDEPENDENT_AMBULATORY_CARE_PROVIDER_SITE_OTHER): Payer: 59 | Admitting: Sports Medicine

## 2022-08-13 DIAGNOSIS — M1711 Unilateral primary osteoarthritis, right knee: Secondary | ICD-10-CM

## 2022-08-13 MED ORDER — HYALURONAN 30 MG/2ML IX SOSY
30.0000 mg | PREFILLED_SYRINGE | Freq: Once | INTRA_ARTICULAR | Status: AC
  Administered 2022-08-13: 30 mg via INTRA_ARTICULAR

## 2022-08-13 NOTE — Progress Notes (Signed)
    Procedures performed today:    Procedure: Real-time Ultrasound Guided injection of the right knee Device: Samsung HS60  Verbal informed consent obtained.  Time-out conducted.  Noted no overlying erythema, induration, or other signs of local infection.  Skin prepped in a sterile fashion.  Local anesthesia: Topical Ethyl chloride.  With sterile technique and under real time ultrasound guidance: Noted mild effusion, 30 mg/2 mL of OrthoVisc (sodium hyaluronate) in a prefilled syringe was injected easily into the knee through a 22-gauge needle. Completed without difficulty  Advised to call if fevers/chills, erythema, induration, drainage, or persistent bleeding.  Images permanently stored and available for review in PACS.  Impression: Technically successful ultrasound guided injection.  Independent interpretation of notes and tests performed by another provider:   None.  Brief History, Exam, Impression, and Recommendations:    Primary osteoarthritis of right knee, status post left total knee arthroplasty/revision patellectomy This is a very pleasant 62 year old female, complex problem with her right knee, internal derangements, intra-articular loose bodies, meniscal derangements, had an arthroscopy not to longer with Dr. Griffin Basil, loose bodies were removed, meniscal degeneration was addressed but unfortunately she had very unhealthy cartilage, so we started Orthovisc. Today we did Orthovisc No. 2 of 4 right knee. Of note she is status post left knee arthroplasty with revision patellectomy..    ____________________________________________ Gwen Her. Dianah Field, M.D., ABFM., CAQSM., AME. Primary Care and Sports Medicine [facility]  Adjunct Professor of [place] [employer] [facility] of [employer]  Risk manager

## 2022-08-13 NOTE — Assessment & Plan Note (Addendum)
This is a very pleasant 62 year old female, complex problem with her right knee, internal derangements, intra-articular loose bodies, meniscal derangements, had an arthroscopy not to longer with Dr. Griffin Basil, loose bodies were removed, meniscal degeneration was addressed but unfortunately she had very unhealthy cartilage, so we started Orthovisc. Today we did Orthovisc No. 2 of 4 right knee. Of note she is status post left knee arthroplasty with revision patellectomy.Marland Kitchen

## 2022-08-20 ENCOUNTER — Ambulatory Visit (INDEPENDENT_AMBULATORY_CARE_PROVIDER_SITE_OTHER): Payer: 59 | Admitting: Sports Medicine

## 2022-08-20 ENCOUNTER — Ambulatory Visit (INDEPENDENT_AMBULATORY_CARE_PROVIDER_SITE_OTHER): Payer: 59

## 2022-08-20 DIAGNOSIS — M1711 Unilateral primary osteoarthritis, right knee: Secondary | ICD-10-CM | POA: Diagnosis not present

## 2022-08-20 MED ORDER — HYALURONAN 30 MG/2ML IX SOSY
30.0000 mg | PREFILLED_SYRINGE | Freq: Once | INTRA_ARTICULAR | Status: AC
  Administered 2022-08-20: 30 mg via INTRA_ARTICULAR

## 2022-08-20 NOTE — Addendum Note (Signed)
Addended by: Tarri Glenn A on: 08/20/2022 11:18 AM   Modules accepted: Orders

## 2022-08-20 NOTE — Assessment & Plan Note (Signed)
Orthovisc 3 of 4 right knee, return in 1 week for #4 of 4.

## 2022-08-20 NOTE — Progress Notes (Signed)
    Procedures performed today:    Procedure: Real-time Ultrasound Guided injection of the right knee Device: Samsung HS60  Verbal informed consent obtained.  Time-out conducted.  Noted no overlying erythema, induration, or other signs of local infection.  Skin prepped in a sterile fashion.  Local anesthesia: Topical Ethyl chloride.  With sterile technique and under real time ultrasound guidance: Noted mild effusion, 30 mg/2 mL of OrthoVisc (sodium hyaluronate) in a prefilled syringe was injected easily into the knee through a 22-gauge needle. Completed without difficulty  Advised to call if fevers/chills, erythema, induration, drainage, or persistent bleeding.  Images permanently stored and available for review in PACS.  Impression: Technically successful ultrasound guided injection.  Independent interpretation of notes and tests performed by another provider:   None.  Brief History, Exam, Impression, and Recommendations:    Primary osteoarthritis of right knee, status post left total knee arthroplasty/revision patellectomy Orthovisc 3 of 4 right knee, return in 1 week for #4 of 4.    ____________________________________________ Gwen Her. Dianah Field, M.D., ABFM., CAQSM., AME. Primary Care and Sports Medicine [facility]  Adjunct Professor of [place] of [facility] of Medicine  Risk manager

## 2022-08-27 ENCOUNTER — Ambulatory Visit (INDEPENDENT_AMBULATORY_CARE_PROVIDER_SITE_OTHER): Payer: 59

## 2022-08-27 ENCOUNTER — Ambulatory Visit: Payer: 59 | Admitting: Sports Medicine

## 2022-08-27 DIAGNOSIS — M1711 Unilateral primary osteoarthritis, right knee: Secondary | ICD-10-CM

## 2022-08-27 MED ORDER — HYALURONAN 30 MG/2ML IX SOSY
30.0000 mg | PREFILLED_SYRINGE | Freq: Once | INTRA_ARTICULAR | Status: AC
  Administered 2022-08-27: 30 mg via INTRA_ARTICULAR

## 2022-08-27 NOTE — Progress Notes (Signed)
    Procedures performed today:    Procedure: Real-time Ultrasound Guided aspiration and injection right knee Device: Samsung HS60  Verbal informed consent obtained.  Time-out conducted.  Noted no overlying erythema, induration, or other signs of local infection.  Skin prepped in a sterile fashion.  Local anesthesia: Topical Ethyl chloride.  With sterile technique and under real time ultrasound guidance: Noted effusion, using an 18-gauge needle I aspirated 20 mL of clear, straw-colored fluid, syringe switched and 30 mg/2 mL of OrthoVisc (sodium hyaluronate) in a prefilled syringe was injected easily into the knee through an 18-gauge needle. Completed without difficulty  Advised to call if fevers/chills, erythema, induration, drainage, or persistent bleeding.  Images permanently stored and available for review in PACS.  Impression: Technically successful ultrasound guided aspiration/injection.  Independent interpretation of notes and tests performed by another provider:   None.  Brief History, Exam, Impression, and Recommendations:    Primary osteoarthritis of right knee, status post left total knee arthroplasty/revision patellectomy Orthovisc 4 of 4 right knee, return as needed.    ____________________________________________ Gwen Her. Dianah Field, M.D., ABFM., CAQSM., AME. Primary Care and Sports Medicine [facility]  Adjunct Professor of [place] [employer] [facility] of Medicine  Risk manager

## 2022-08-27 NOTE — Assessment & Plan Note (Addendum)
Orthovisc 4 of 4 right knee, return as needed.

## 2022-09-13 ENCOUNTER — Encounter: Payer: Self-pay | Admitting: Sports Medicine

## 2022-10-16 ENCOUNTER — Encounter: Payer: Self-pay | Admitting: Sports Medicine

## 2022-10-21 ENCOUNTER — Other Ambulatory Visit: Payer: Self-pay | Admitting: Sports Medicine

## 2022-10-21 DIAGNOSIS — M1711 Unilateral primary osteoarthritis, right knee: Secondary | ICD-10-CM

## 2022-10-27 ENCOUNTER — Other Ambulatory Visit: Payer: Self-pay | Admitting: Physician Assistant

## 2022-10-27 DIAGNOSIS — F331 Major depressive disorder, recurrent, moderate: Secondary | ICD-10-CM

## 2022-10-27 DIAGNOSIS — G894 Chronic pain syndrome: Secondary | ICD-10-CM

## 2022-10-28 ENCOUNTER — Encounter: Payer: Self-pay | Admitting: Sports Medicine

## 2022-10-28 DIAGNOSIS — M1711 Unilateral primary osteoarthritis, right knee: Secondary | ICD-10-CM

## 2022-10-28 MED ORDER — TRAMADOL HCL 50 MG PO TABS: ORAL_TABLET | ORAL | 3 refills | Status: DC

## 2022-11-07 ENCOUNTER — Other Ambulatory Visit: Payer: Self-pay | Admitting: Family Medicine

## 2022-11-07 DIAGNOSIS — G894 Chronic pain syndrome: Secondary | ICD-10-CM

## 2022-11-14 ENCOUNTER — Ambulatory Visit: Payer: 59 | Admitting: Sports Medicine

## 2022-11-18 ENCOUNTER — Encounter: Payer: Self-pay | Admitting: *Deleted

## 2022-12-02 ENCOUNTER — Ambulatory Visit: Payer: 59 | Admitting: Physician Assistant

## 2022-12-02 ENCOUNTER — Other Ambulatory Visit: Payer: Self-pay | Admitting: Physician Assistant

## 2022-12-02 ENCOUNTER — Encounter: Payer: Self-pay | Admitting: Physician Assistant

## 2022-12-02 VITALS — BP 156/77 | HR 87 | Resp 20 | Ht 64.0 in | Wt 228.0 lb

## 2022-12-02 DIAGNOSIS — G8929 Other chronic pain: Secondary | ICD-10-CM

## 2022-12-02 DIAGNOSIS — M450 Ankylosing spondylitis of multiple sites in spine: Secondary | ICD-10-CM | POA: Diagnosis not present

## 2022-12-02 DIAGNOSIS — M720 Palmar fascial fibromatosis [Dupuytren]: Secondary | ICD-10-CM

## 2022-12-02 DIAGNOSIS — M25562 Pain in left knee: Secondary | ICD-10-CM | POA: Diagnosis not present

## 2022-12-02 DIAGNOSIS — M25561 Pain in right knee: Secondary | ICD-10-CM

## 2022-12-02 DIAGNOSIS — G894 Chronic pain syndrome: Secondary | ICD-10-CM

## 2022-12-02 DIAGNOSIS — M1711 Unilateral primary osteoarthritis, right knee: Secondary | ICD-10-CM | POA: Diagnosis not present

## 2022-12-02 DIAGNOSIS — F331 Major depressive disorder, recurrent, moderate: Secondary | ICD-10-CM

## 2022-12-02 DIAGNOSIS — I10 Essential (primary) hypertension: Secondary | ICD-10-CM

## 2022-12-02 DIAGNOSIS — Z1211 Encounter for screening for malignant neoplasm of colon: Secondary | ICD-10-CM

## 2022-12-02 MED ORDER — CARIPRAZINE HCL 1.5 MG PO CAPS: 1.5000 mg | ORAL_CAPSULE | Freq: Every day | ORAL | 1 refills | Status: DC

## 2022-12-02 MED ORDER — KETOROLAC TROMETHAMINE 60 MG/2ML IM SOLN
60.0000 mg | Freq: Once | INTRAMUSCULAR | Status: AC
  Administered 2022-12-02: 60 mg via INTRAMUSCULAR

## 2022-12-02 MED ORDER — TRIAMCINOLONE ACETONIDE 0.1 % EX CREA: 1.0000 | TOPICAL_CREAM | Freq: Two times a day (BID) | CUTANEOUS | 0 refills | Status: DC

## 2022-12-02 MED ORDER — PREDNISONE 20 MG PO TABS: 20.0000 mg | ORAL_TABLET | Freq: Every day | ORAL | 0 refills | Status: DC

## 2022-12-02 MED ORDER — GABAPENTIN 300 MG PO CAPS: 300.0000 mg | ORAL_CAPSULE | Freq: Three times a day (TID) | ORAL | 1 refills | Status: DC

## 2022-12-02 NOTE — Progress Notes (Signed)
Established Patient Office Visit  Subjective   Patient ID: Tracy Landry, female    DOB: 1961/01/18  Age: 62 y.o. MRN: [id number]  Chief Complaint  Patient presents with   Follow-up   Medication Refill    HPI Pt is a 62 yo female who presents to the clinic with her partner to discuss medications and mood.   Pt is concerned because she feels more and more depressed. She is in pain all the time and feels like there is no hope for her pain. She needs surgery on her feet and right knee but does not know if she can be non-weight bearing and support her other leg. She uses tramadol and helps with pain at night but makes her sleepy. She needs to work during the day and cannot be sleepy. She is taking gabapentin morning and night 100mg  and tolerating well. Flexeril is at bedtime. She is not able to exercise due to pain, evening minimal walking is painful. She denies any SI/HC. She is taking effexor.    .. Active Ambulatory Problems    Diagnosis Date Noted   HYPERLIPIDEMIA 08/22/2006   Class 2 severe obesity due to excess calories with serious comorbidity and body mass index (BMI) of 37.0 to 37.9 in adult (HCC) 06/01/2006   ASTHMA, PERSISTENT, MILD 08/12/2006   GASTROESOPHAGEAL REFLUX, NO ESOPHAGITIS 05/13/2006   ANKYLOSING SPONDYLITIS 01/06/2007   COUGH 05/15/2010   SVT (supraventricular tachycardia) 04/15/2012   Fatigue 04/15/2012   Essential hypertension 07/15/2012   Primary osteoarthritis, right shoulder 02/03/2014   Plantar fasciitis, left 06/09/2014   Anxiety 03/28/2015   Low back pain 02/22/2016   Primary osteoarthritis of right knee, status post left total knee arthroplasty/revision patellectomy 05/08/2016   Periscapular pain 06/03/2016   Depression 03/15/2011   Primary osteoarthritis of first carpometacarpal joint of right hand 03/26/2018   Acute medial meniscus tear of left knee 03/26/2019   Arthritis of knee, left 03/15/2019   Asthma 08/12/2006   Dupuytren's disease  09/30/2018   Family history of diabetes mellitus in mother 04/01/2019   Impaired fasting glucose 04/02/2019   S/P TKR (total knee replacement) using cement, left 05/07/2019   Strain of rhomboid muscle 03/12/2021   Moderate episode of recurrent major depressive disorder (HCC) 03/12/2021   Chronic pain syndrome 03/12/2021   Non-restorative sleep 03/12/2021   Snoring 03/12/2021   Insufficiency fracture of medial femoral condyle (HCC) 01/30/2022   Insomnia 03/11/2022   Calcific tendinitis of right shoulder 06/14/2019   Pain 10/30/2015   Resolved Ambulatory Problems    Diagnosis Date Noted   VIRAL URI 07/14/2007   RHINITIS, ALLERGIC 05/13/2006   OSTEOPENIA 05/13/2006   Palpitations 08/03/2007   CHEST PAIN, RIGHT 05/15/2010   Essential hypertension, malignant 04/15/2012   Thrush of mouth and esophagus (HCC) 03/26/2013   Peroneal tendinitis of left lower leg 04/01/2014   Left tibialis posterior tendonitis 08/08/2014   Puncture wound 09/13/2015   Right wrist pain 09/26/2016   Closed Colles' fracture of right radius 10/30/2015   Estrogen deficiency 01/30/2022   Past Medical History:  Diagnosis Date   Back pain    GERD (gastroesophageal reflux disease)    Hypertension    PONV (postoperative nausea and vomiting)    Wears glasses       ROS See HPI.    Objective:     BP (!) 156/77   Pulse 87   Resp 20   Ht 5\' 4"  (1.626 m)   Wt 228 lb (103.4 kg)  LMP 10/28/2006   SpO2 96%   BMI 39.14 kg/m  BP Readings from Last 3 Encounters:  12/02/22 (!) 156/77  07/22/22 (!) 146/95  03/14/22 (!) 130/96   Wt Readings from Last 3 Encounters:  12/02/22 228 lb (103.4 kg)  03/14/22 229 lb 8 oz (104.1 kg)  03/11/22 230 lb (104.3 kg)    ..    12/02/2022   11:53 AM 03/11/2022   11:33 AM 02/15/2022   10:44 AM 11/27/2021   11:02 AM 04/10/2021    1:19 PM  Depression screen PHQ 2/9  Decreased Interest 2 1 0 0 0  Down, Depressed, Hopeless 1 0 0 0 0  PHQ - 2 Score 3 1 0 0 0  Altered  sleeping 3 3   1   Tired, decreased energy 3 3   1   Change in appetite 1 1   0  Feeling bad or failure about yourself  1 0   0  Trouble concentrating 0 2   0  Moving slowly or fidgety/restless 1 0   0  Suicidal thoughts 0 0   0  PHQ-9 Score 12 10   2   Difficult doing work/chores Somewhat difficult Somewhat difficult   Not difficult at all   ..    12/02/2022   11:54 AM 03/11/2022   11:33 AM 04/10/2021    1:19 PM  GAD 7 : Generalized Anxiety Score  Nervous, Anxious, on Edge 2 1 1   Control/stop worrying 1 1 1   Worry too much - different things 1 1 1   Trouble relaxing 1 1 1   Restless 1 1 0  Easily annoyed or irritable 1 1 0  Afraid - awful might happen 0 0 0  Total GAD 7 Score 7 6 4   Anxiety Difficulty Not difficult at all Somewhat difficult Not difficult at all      Physical Exam Constitutional:      Appearance: Normal appearance. She is obese.  HENT:     Head: Normocephalic.  Cardiovascular:     Rate and Rhythm: Normal rate.  Pulmonary:     Effort: Pulmonary effort is normal.  Musculoskeletal:     Right lower leg: No edema.     Left lower leg: No edema.  Neurological:     General: No focal deficit present.     Mental Status: She is alert and oriented to person, place, and time.  Psychiatric:        Mood and Affect: Mood normal.       The 10-year ASCVD risk score (Arnett DK, et al., 2019) is: 4.9%    Assessment & Plan:  Marland KitchenMarland KitchenSiren was seen today for follow-up and medication refill.  Diagnoses and all orders for this visit:  Chronic pain syndrome -     gabapentin (NEURONTIN) 300 MG capsule; Take 1 capsule (300 mg total) by mouth 3 (three) times daily. -     predniSONE (DELTASONE) 20 MG tablet; Take 1 tablet (20 mg total) by mouth daily with breakfast. -     ketorolac (TORADOL) injection 60 mg -     Ambulatory referral to Psychology  Essential hypertension  Moderate episode of recurrent major depressive disorder (HCC) -     cariprazine (VRAYLAR) 1.5 MG capsule;  Take 1 capsule (1.5 mg total) by mouth daily. -     Ambulatory referral to Psychology  Colon cancer screening  Bilateral chronic knee pain -     predniSONE (DELTASONE) 20 MG tablet; Take 1 tablet (20 mg total) by mouth daily  with breakfast.  Ankylosing spondylitis of multiple sites in spine (HCC) -     predniSONE (DELTASONE) 20 MG tablet; Take 1 tablet (20 mg total) by mouth daily with breakfast.  Primary osteoarthritis of right knee, status post left total knee arthroplasty/revision patellectomy -     predniSONE (DELTASONE) 20 MG tablet; Take 1 tablet (20 mg total) by mouth daily with breakfast.  Dupuytren's disease -     predniSONE (DELTASONE) 20 MG tablet; Take 1 tablet (20 mg total) by mouth daily with breakfast.  Other orders -     triamcinolone cream (KENALOG) 0.1 %; Apply 1 Application topically 2 (two) times daily. To lower leg rash.   PHQ/GAD not to goal I certainly believe that chronic pain is effecting mood. She continues to work and does not want to take opioids during the day that could interfere with focus.  Toradol 60mg  IM given in office today.  Increased gabapentin to 300mg  up to three times a day.  Prednisone for 5 days to see if burst would help with some inflammation Added vraylar for depression Referral for counseling Continue to use tramadol at bedtime and night for pain Follow up in 4-6 weeks  She has been limited in the surgical interventions that she can get due to her recovery.  Suggest revisiting podiatry about bilateral foot pain and intervention for dupuytrens disease.    Tandy Gaw, PA-C

## 2022-12-02 NOTE — Patient Instructions (Signed)
Increased gabapentin Prednisone for 5 days Vryalar for depression Follow up with podiatry

## 2022-12-04 ENCOUNTER — Encounter: Payer: Self-pay | Admitting: Physician Assistant

## 2022-12-06 ENCOUNTER — Encounter: Payer: Self-pay | Admitting: Physician Assistant

## 2022-12-09 ENCOUNTER — Telehealth: Payer: Self-pay | Admitting: Physician Assistant

## 2022-12-09 ENCOUNTER — Other Ambulatory Visit: Payer: Self-pay | Admitting: Physician Assistant

## 2022-12-11 ENCOUNTER — Telehealth: Payer: Self-pay

## 2022-12-11 NOTE — Telephone Encounter (Addendum)
Initiated Prior authorization ZOX:[id number] 1.5MG  capsules Via: Covermymeds Case/Key:[id number] Status: approved  as of 12/11/22 Reason:Authorization Expiration Date: Dec 11, 2023 Notified Pt via: Clinical cytogeneticist

## 2022-12-13 ENCOUNTER — Other Ambulatory Visit: Payer: Self-pay | Admitting: Physician Assistant

## 2022-12-13 DIAGNOSIS — M1711 Unilateral primary osteoarthritis, right knee: Secondary | ICD-10-CM

## 2022-12-22 ENCOUNTER — Other Ambulatory Visit: Payer: Self-pay | Admitting: Physician Assistant

## 2022-12-22 DIAGNOSIS — M1711 Unilateral primary osteoarthritis, right knee: Secondary | ICD-10-CM

## 2022-12-25 ENCOUNTER — Other Ambulatory Visit: Payer: Self-pay | Admitting: Physician Assistant

## 2022-12-25 DIAGNOSIS — M898X1 Other specified disorders of bone, shoulder: Secondary | ICD-10-CM

## 2022-12-26 ENCOUNTER — Encounter: Payer: Self-pay | Admitting: Physician Assistant

## 2022-12-26 DIAGNOSIS — M898X1 Other specified disorders of bone, shoulder: Secondary | ICD-10-CM

## 2022-12-26 MED ORDER — CYCLOBENZAPRINE HCL 10 MG PO TABS: ORAL_TABLET | ORAL | 1 refills | Status: DC

## 2022-12-28 ENCOUNTER — Other Ambulatory Visit: Payer: Self-pay | Admitting: Physician Assistant

## 2022-12-28 DIAGNOSIS — M1711 Unilateral primary osteoarthritis, right knee: Secondary | ICD-10-CM | POA: Diagnosis not present

## 2022-12-28 DIAGNOSIS — S20211A Contusion of right front wall of thorax, initial encounter: Secondary | ICD-10-CM | POA: Diagnosis not present

## 2022-12-31 ENCOUNTER — Encounter: Payer: Self-pay | Admitting: Sports Medicine

## 2023-01-03 ENCOUNTER — Other Ambulatory Visit: Payer: Self-pay

## 2023-01-03 ENCOUNTER — Ambulatory Visit: Payer: 59 | Admitting: Sports Medicine

## 2023-01-03 ENCOUNTER — Other Ambulatory Visit (INDEPENDENT_AMBULATORY_CARE_PROVIDER_SITE_OTHER): Payer: 59

## 2023-01-03 DIAGNOSIS — M1711 Unilateral primary osteoarthritis, right knee: Secondary | ICD-10-CM

## 2023-01-03 DIAGNOSIS — M1811 Unilateral primary osteoarthritis of first carpometacarpal joint, right hand: Secondary | ICD-10-CM

## 2023-01-03 NOTE — Assessment & Plan Note (Signed)
Increasing pain right knee, we finished Orthovisc in January, steroid injection today, return as needed.

## 2023-01-03 NOTE — Assessment & Plan Note (Signed)
Right first CMC injection, last done January 2024.

## 2023-01-03 NOTE — Progress Notes (Signed)
    Procedures performed today:    Procedure: Real-time Ultrasound Guided injection of the right first [facility] Device: Samsung HS60  Verbal informed consent obtained.  Time-out conducted.  Noted no overlying erythema, induration, or other signs of local infection.  Skin prepped in a sterile fashion.  Local anesthesia: Topical Ethyl chloride.  With sterile technique and under real time ultrasound guidance: Severely arthritic joint noted, 1/2 cc lidocaine, 1/2 cc kenalog 40 injected easily  completed without difficulty  Advised to call if fevers/chills, erythema, induration, drainage, or persistent bleeding.  Images permanently stored and available for review in PACS.  Impression: Technically successful ultrasound guided injection.  Procedure: Real-time Ultrasound Guided injection of the right knee Device: Samsung HS60  Verbal informed consent obtained.  Time-out conducted.  Noted no overlying erythema, induration, or other signs of local infection.  Skin prepped in a sterile fashion.  Local anesthesia: Topical Ethyl chloride.  With sterile technique and under real time ultrasound guidance: Mild effusion noted, 1 cc Kenalog 40, 2 cc lidocaine, 2 cc bupivacaine injected easily Completed without difficulty  Advised to call if fevers/chills, erythema, induration, drainage, or persistent bleeding.  Images permanently stored and available for review in PACS.  Impression: Technically successful ultrasound guided injection.  Independent interpretation of notes and tests performed by another provider:   None.  Brief History, Exam, Impression, and Recommendations:    Primary osteoarthritis of right knee, status post left total knee arthroplasty/revision patellectomy Increasing pain right knee, we finished Orthovisc in January, steroid injection today, return as needed.  Primary osteoarthritis of first carpometacarpal joint of right hand Right first CMC injection, last done January  2024.    ____________________________________________ Tracy Landry. Benjamin Stain, M.D., ABFM., CAQSM., AME. Primary Care and Sports Medicine [facility]  Adjunct Professor of Family Medicine  [place] [employer] [facility] [employer] Medicine  Restaurant manager, fast food

## 2023-01-14 ENCOUNTER — Ambulatory Visit
Admission: EM | Admit: 2023-01-14 | Discharge: 2023-01-14 | Disposition: A | Discharge status: Home / Self Care | Payer: 59 | Attending: Family Medicine | Admitting: Family Medicine

## 2023-01-14 ENCOUNTER — Telehealth: Payer: Self-pay

## 2023-01-14 ENCOUNTER — Ambulatory Visit (INDEPENDENT_AMBULATORY_CARE_PROVIDER_SITE_OTHER): Payer: 59

## 2023-01-14 DIAGNOSIS — S99912A Unspecified injury of left ankle, initial encounter: Secondary | ICD-10-CM | POA: Diagnosis not present

## 2023-01-14 DIAGNOSIS — M25572 Pain in left ankle and joints of left foot: Secondary | ICD-10-CM | POA: Diagnosis not present

## 2023-01-14 NOTE — Telephone Encounter (Signed)
Pt advised of neg xray results. Continue to RICE and follow up with Sports Med if not improved in 7-10 days. Pt verbalized understanding.

## 2023-01-14 NOTE — ED Provider Notes (Signed)
[facility]    CSN: [id number] Arrival date & time: 01/14/23  1019      History   Chief Complaint Chief Complaint  Patient presents with   Ankle Injury    LT    HPI Tracy Landry is a 62 y.o. female.   HPI Tracy Landry 62 year old female presents with left ankle injury that occurred at 9 AM this morning.  Patient reports rolled left ankle on uneven pavement and hurts to bear weight.  PMH significant for obesity, pain syndrome, HTN, and history of SVT.  Patient is accompanied by her friend today.  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   Wears glasses     Patient Active Problem List   Diagnosis Date Noted   Insomnia 03/11/2022   Insufficiency fracture of medial femoral condyle (HCC) 01/30/2022   Strain of rhomboid muscle 03/12/2021   Moderate episode of recurrent major depressive disorder (HCC) 03/12/2021   Chronic pain syndrome 03/12/2021   Non-restorative sleep 03/12/2021   Snoring 03/12/2021   Calcific tendinitis of right shoulder 06/14/2019   S/P TKR (total knee replacement) using cement, left 05/07/2019   Impaired fasting glucose 04/02/2019   Family history of diabetes mellitus in mother 04/01/2019   Acute medial meniscus tear of left knee 03/26/2019   Arthritis of knee, left 03/15/2019   Dupuytren's disease 09/30/2018   Primary osteoarthritis of first carpometacarpal joint of right hand 03/26/2018   Periscapular pain 06/03/2016   Primary osteoarthritis of right knee, status post left total knee arthroplasty/revision patellectomy 05/08/2016   Low back pain 02/22/2016   Pain 10/30/2015   Anxiety 03/28/2015   Plantar fasciitis, left 06/09/2014   Primary osteoarthritis, right shoulder 02/03/2014   Essential hypertension 07/15/2012   SVT  (supraventricular tachycardia) 04/15/2012   Fatigue 04/15/2012   Depression 03/15/2011   COUGH 05/15/2010   ANKYLOSING SPONDYLITIS 01/06/2007   HYPERLIPIDEMIA 08/22/2006   ASTHMA, PERSISTENT, MILD 08/12/2006   Asthma 08/12/2006   Class 2 severe obesity due to excess calories with serious comorbidity and body mass index (BMI) of 37.0 to 37.9 in adult (HCC) 06/01/2006   GASTROESOPHAGEAL REFLUX, NO ESOPHAGITIS 05/13/2006    Past Surgical History:  Procedure Laterality Date   ABLATION OF DYSRHYTHMIC FOCUS  10/07/2012   SVT   APPENDECTOMY     FOOT SURGERY  2000   rt foot fx   HARDWARE REMOVAL  2003   rt arm   KNEE ARTHROSCOPY WITH MEDIAL MENISECTOMY Right 03/14/2022   Procedure: KNEE ARTHROSCOPY WITH MEDIAL MENISCAL ROOT REPAIR;  Surgeon: Bjorn Pippin, MD;  Location: [facility];  Service: Orthopedics;  Laterality: Right;   MASS EXCISION Left 07/07/2013   Procedure: EXCISION MASS LEFT RING FINGER;  Surgeon: Nicki Reaper, MD;  Location: [facility];  Service: Orthopedics;  Laterality: Left;   PATELLECTOMY     REPAIR EXTENSOR TENDON Left 07/07/2013   Procedure: REPAIR EXTENSOR TENDON LEFT WITH DEBRIDMENT PROXIMAL INTERPHALANGEAL JOINT;  Surgeon: Nicki Reaper, MD;  Location: [facility];  Service: Orthopedics;  Laterality: Left;   right arm reconstruction  08/1999   SUPRAVENTRICULAR TACHYCARDIA ABLATION N/A 10/07/2012   Procedure: SUPRAVENTRICULAR TACHYCARDIA ABLATION;  Surgeon: Marinus Maw, MD;  Location: [facility] C[facility];  Service: Cardiovascular;  Laterality: N/A;   TOTAL ABDOMINAL HYSTERECTOMY  10/2007   TOTAL KNEE ARTHROPLASTY      OB History     Gravida  0   Para  0   Term  0   Preterm  0   AB  0   Living  0      SAB  0   IAB  0   Ectopic  0   Multiple  0   Live Births  0            Home Medications    Prior to Admission medications   Medication Sig Start Date End Date Taking? Authorizing Provider  albuterol  (VENTOLIN HFA) 108 (90 Base) MCG/ACT inhaler Inhale 2 puffs into the lungs every 6 (six) hours as needed. 07/23/22   Breeback, Lonna Cobb, PA-C  cariprazine (VRAYLAR) 1.5 MG capsule Take 1 capsule (1.5 mg total) by mouth daily. 12/02/22   Breeback, Jade L, PA-C  cyclobenzaprine (FLEXERIL) 10 MG tablet TAKE 1 TABLET BY MOUTH AT BEDTIME 12/26/22   Breeback, Jade L, PA-C  esomeprazole (NEXIUM) 20 MG capsule Take by mouth.    [provider]  gabapentin (NEURONTIN) 300 MG capsule Take 1 capsule (300 mg total) by mouth 3 (three) times daily. 12/02/22   Breeback, Jade L, PA-C  montelukast (SINGULAIR) 10 MG tablet TAKE 1 TABLET BY MOUTH AT BEDTIME 07/17/22   Breeback, Jade L, PA-C  pravastatin (PRAVACHOL) 20 MG tablet Take 1 tablet by mouth once daily 04/18/22   Breeback, Jade L, PA-C  promethazine (PHENERGAN) 12.5 MG tablet Take 1 tablet (12.5 mg total) by mouth every 6 (six) hours as needed for nausea or vomiting. 03/14/22   Bjorn Pippin, MD  traMADol Janean Sark) 50 MG tablet 1 tab in the morning, 2 tabs at night 10/28/22   Monica Becton, MD  traZODone (DESYREL) 50 MG tablet TAKE 1/2 TO 1 (ONE-HALF TO ONE) TABLET BY MOUTH AT BEDTIME AS NEEDED FOR SLEEP 04/29/22   Breeback, Jade L, PA-C  triamcinolone cream (KENALOG) 0.1 % APPLY  CREAM EXTERNALLY TWICE DAILY TO  LOWER  LEG  RASH 12/31/22   Breeback, Jade L, PA-C  venlafaxine XR (EFFEXOR-XR) 150 MG 24 hr capsule Take 1 capsule (150 mg total) by mouth daily with breakfast. Needs appt 10/28/22   Jomarie Longs, PA-C    Family History Family History  Problem Relation Age of Onset   Alcohol abuse Mother    Diabetes Mother    Hyperlipidemia Mother    Hypertension Mother    Heart disease Mother        Rheumatic disease   Alzheimer's disease Father    Diabetes Sister    Hyperlipidemia Sister    Hypertension Sister    Heart attack Maternal Uncle    Lung cancer Maternal Grandmother        lung   Heart attack Maternal Grandfather     Social  History Social History   Tobacco Use   Smoking status: Never   Smokeless tobacco: Never  Vaping Use   Vaping Use: Former  Substance Use Topics   Alcohol use: Yes    Comment: 3 a week   Drug use: No     Allergies   Chlorhexidine gluconate, Amoxicillin-pot clavulanate, and Codeine sulfate   Review of Systems Review of Systems  Musculoskeletal:        Left ankle injury this morning at 9 AM     Physical Exam Triage Vital Signs  ED Triage Vitals  Enc Vitals Group     BP 01/14/23 1057 (!) 160/92     Pulse Rate 01/14/23 1057 88     Resp 01/14/23 1057 17     Temp 01/14/23 1057 98 F (36.7 C)     Temp Source 01/14/23 1057 Oral     SpO2 01/14/23 1057 97 %     Weight --      Height --      Head Circumference --      Peak Flow --      Pain Score 01/14/23 1056 6     Pain Loc --      Pain Edu? --      Excl. in GC? --    No data found.  Updated Vital Signs BP (!) 160/92 (BP Location: Right Arm)   Pulse 88   Temp 98 F (36.7 C) (Oral)   Resp 17   LMP 10/28/2006   SpO2 97%      Physical Exam Vitals and nursing note reviewed.  Constitutional:      Appearance: Normal appearance. She is normal weight.  HENT:     Head: Normocephalic and atraumatic.     Mouth/Throat:     Mouth: Mucous membranes are moist.     Pharynx: Oropharynx is clear.  Eyes:     Extraocular Movements: Extraocular movements intact.     Conjunctiva/sclera: Conjunctivae normal.     Pupils: Pupils are equal, round, and reactive to light.  Cardiovascular:     Rate and Rhythm: Normal rate and regular rhythm.     Pulses: Normal pulses.     Heart sounds: Normal heart sounds.  Pulmonary:     Effort: Pulmonary effort is normal.     Breath sounds: Normal breath sounds. No wheezing, rhonchi or rales.  Musculoskeletal:        General: Normal range of motion.     Cervical back: Normal range of motion and neck supple.     Comments: Left ankle (lateral aspect over malleolus): TTP with moderate soft  tissue swelling noted range of motion limited  Skin:    General: Skin is warm and dry.  Neurological:     General: No focal deficit present.     Mental Status: She is alert and oriented to person, place, and time. Mental status is at baseline.  Psychiatric:        Mood and Affect: Mood normal.        Behavior: Behavior normal.     UC Treatments / Results  Labs (all labs ordered are listed, but only abnormal results are displayed) Labs Reviewed - No data to display  EKG   Radiology DG Ankle Complete Left  Result Date: 01/14/2023 CLINICAL DATA:  Rolled left ankle this morning when stepped wrong. Lateral ankle pain. EXAM: LEFT ANKLE COMPLETE - 3+ VIEW COMPARISON:  None Available. FINDINGS: Normal bone mineralization. Mild-to-moderate plantar calcaneal heel spur. Mild dorsal talonavicular degenerative osteophytosis. The ankle mortise is symmetric and intact. Minimal distal fibula and adjacent lateral talar process degenerative spurring and subchondral cystic change. No acute fracture or dislocation. IMPRESSION: 1. No acute fracture. 2. Mild-to-moderate plantar calcaneal heel spur. Electronically Signed   By: Neita Garnet M.D.   On: 01/14/2023 13:45    Procedures Procedures (including critical care time)  Medications Ordered in UC Medications - No data to display  Initial Impression / Assessment and Plan / UC Course  I have reviewed the triage vital signs and the nursing notes.  Pertinent labs & imaging results that were available during my care of the patient were reviewed by me and considered in my medical decision making (see chart for details).     MDM: 1.  Injury of left ankle, initial encounter left ankle x-ray revealed above-patient advised of these results via phone call following office visit today due to delayed x-ray results; 2. Left ankle pain, unspecified chronicity-advised patient to RICE affected area of left ankle for 30 minutes 3 times daily for the next 3 days.   Advised if symptoms worsen and/or unresolved please follow-up with PCP, orthopedics or here for further evaluation.  Patient discharged home, hemodynamically stable.   Final Clinical Impressions(s) / UC Diagnoses   Final diagnoses:  Injury of left ankle, initial encounter  Left ankle pain, unspecified chronicity     Discharge Instructions      Patient advised of left ankle x-ray results.  Advised patient to RICE affected area of left ankle for 30 minutes 3 times daily for the next 3 days.  Advised if symptoms worsen and/or unresolved please follow-up with PCP, orthopedics or here for further evaluation.  Orthopedic contact information provided on AVS.     ED Prescriptions   None    I have reviewed the PDMP during this encounter.   Trevor Iha, FNP 01/14/23 1650

## 2023-01-14 NOTE — ED Triage Notes (Signed)
Pt c/o LT ankle injury since 9am this morning. Says she rolled it on uneven pavement. Hurts to bare weight. Pain 6/10 Tylenol and tramadol prn.

## 2023-01-14 NOTE — Discharge Instructions (Addendum)
Patient advised of left ankle x-ray results.  Advised patient to RICE affected area of left ankle for 30 minutes 3 times daily for the next 3 days.  Advised if symptoms worsen and/or unresolved please follow-up with PCP, orthopedics or here for further evaluation.  Orthopedic contact information provided on AVS.

## 2023-01-20 ENCOUNTER — Other Ambulatory Visit: Payer: Self-pay | Admitting: Physician Assistant

## 2023-01-20 DIAGNOSIS — M1711 Unilateral primary osteoarthritis, right knee: Secondary | ICD-10-CM

## 2023-01-22 ENCOUNTER — Other Ambulatory Visit: Payer: Self-pay | Admitting: Physician Assistant

## 2023-01-22 DIAGNOSIS — G894 Chronic pain syndrome: Secondary | ICD-10-CM

## 2023-01-22 DIAGNOSIS — F331 Major depressive disorder, recurrent, moderate: Secondary | ICD-10-CM

## 2023-01-24 ENCOUNTER — Other Ambulatory Visit: Payer: Self-pay | Admitting: Physician Assistant

## 2023-01-24 DIAGNOSIS — G894 Chronic pain syndrome: Secondary | ICD-10-CM

## 2023-01-26 ENCOUNTER — Encounter: Payer: Self-pay | Admitting: Physician Assistant

## 2023-01-26 DIAGNOSIS — F331 Major depressive disorder, recurrent, moderate: Secondary | ICD-10-CM

## 2023-01-26 DIAGNOSIS — G894 Chronic pain syndrome: Secondary | ICD-10-CM

## 2023-01-27 ENCOUNTER — Other Ambulatory Visit: Payer: Self-pay | Admitting: Physician Assistant

## 2023-01-27 DIAGNOSIS — M1711 Unilateral primary osteoarthritis, right knee: Secondary | ICD-10-CM

## 2023-01-27 NOTE — Telephone Encounter (Signed)
venlafaxine XR (EFFEXOR-XR) 150 MG 24 hr capsule90 capsule06/19/2024Sig: TAKE 1 CAPSULE BY MOUTH ONCE DAILY WITH BREAKFAST . APPOINTMENT REQUIRED FOR FUTURE REFILLSSent to pharmacy as: venlafaxine XR (EFFEXOR-XR) 150 MG 24 hr capsuleE-Prescribing Status: Receipt confirmed by pharmacy (01/22/2023 10:51 AM EDT)

## 2023-01-28 MED ORDER — DICLOFENAC SODIUM 75 MG PO TBEC: 75.0000 mg | DELAYED_RELEASE_TABLET | Freq: Two times a day (BID) | ORAL | 0 refills | Status: DC

## 2023-01-28 MED ORDER — VENLAFAXINE HCL ER 150 MG PO CP24
ORAL_CAPSULE | ORAL | 0 refills | Status: DC
Start: 2023-01-28 — End: 2023-03-17

## 2023-01-28 NOTE — Addendum Note (Signed)
Addended by: Jomarie Longs on: 01/28/2023 12:12 PM   Modules accepted: Orders

## 2023-02-19 ENCOUNTER — Other Ambulatory Visit: Payer: Self-pay | Admitting: Obstetrics & Gynecology

## 2023-02-19 DIAGNOSIS — Z1231 Encounter for screening mammogram for malignant neoplasm of breast: Secondary | ICD-10-CM

## 2023-02-24 ENCOUNTER — Other Ambulatory Visit: Payer: Self-pay | Admitting: Physician Assistant

## 2023-02-24 DIAGNOSIS — G894 Chronic pain syndrome: Secondary | ICD-10-CM

## 2023-02-27 ENCOUNTER — Ambulatory Visit: Payer: 59 | Admitting: Podiatry

## 2023-02-27 ENCOUNTER — Encounter: Payer: Self-pay | Admitting: Podiatry

## 2023-02-27 DIAGNOSIS — M722 Plantar fascial fibromatosis: Secondary | ICD-10-CM

## 2023-02-27 MED ORDER — DEXAMETHASONE SODIUM PHOSPHATE 120 MG/30ML IJ SOLN
4.0000 mg | Freq: Once | INTRAMUSCULAR | Status: AC
Start: 2023-02-27 — End: 2023-02-27
  Administered 2023-02-27: 4 mg via INTRA_ARTICULAR

## 2023-02-27 MED ORDER — TRIAMCINOLONE ACETONIDE 10 MG/ML IJ SUSP
2.5000 mg | Freq: Once | INTRAMUSCULAR | Status: AC
Start: 2023-02-27 — End: 2023-02-27
  Administered 2023-02-27: 2.5 mg via INTRA_ARTICULAR

## 2023-02-27 NOTE — Progress Notes (Signed)
  Subjective:  Patient ID: Tracy Landry, female    DOB: March 02, 1961,   MRN: [id number]  Chief Complaint  Patient presents with   Plantar Fasciitis    Pt stated " I have dupuytren's contracture on my left arch and I'm here to get a shot for it".    62 y.o. female presents for concern as above. She has had for several years and been seen in the past for this and almost had surgery to remove. Has had injection which helped shrink the area. Denies any other recent treatments. Has contractures on hands as well.  . Denies any other pedal complaints. Denies n/v/f/c.   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   Wears glasses     Objective:  Physical Exam: Vascular: DP/PT pulses 2/4 bilateral. CFT <3 seconds. Normal hair growth on digits. No edema.  Skin. No lacerations or abrasions bilateral feet.  3 cm firm nodule noted to left foot medial central arch. Tender to palaption and mobile with the plantar fascia.  Musculoskeletal: MMT 5/5 bilateral lower extremities in DF, PF, Inversion and Eversion. Deceased ROM in DF of ankle joint.  Neurological: Sensation intact to light touch.   Assessment:   1. Plantar fascial fibromatosis      Plan:  Patient was evaluated and treated and all questions answered. X-rays reviewed and discussed with patient. No acute fractures or osseous changes noted to X-rays from June.  Discussed the diagnosis of fibroma and treatment options with patient. Discussed offloading of the area and proper shoe wear. Discussed steroid injection into the area.  Procedure below  Discussed CMO and patient will look into.  Verapamil cream sent to pharmacy.  Did discuss surgical options and advised patient that fibroma resection is not 100% guaranteed. Patient to  follow-up as needed if symptoms fail to improve or worsen.  Procedure: Injection Tendon/Ligament Discussed alternatives, risks, complications and verbal consent was obtained.  Location: Left central medial arch in area of fibroma. Skin Prep: Alcohol. Injectate: 1cc 0.5% marcaine plain, 1 cc dexamethasone 0.5 cc kenalog  Disposition: Patient tolerated procedure well. Injection site dressed with a band-aid.  Post-injection care was discussed and return precautions discussed.    Louann Sjogren, DPM

## 2023-03-06 ENCOUNTER — Ambulatory Visit: Payer: BLUE CROSS/BLUE SHIELD | Admitting: Podiatry

## 2023-03-11 ENCOUNTER — Encounter: Payer: Self-pay | Admitting: Physician Assistant

## 2023-03-11 DIAGNOSIS — M1711 Unilateral primary osteoarthritis, right knee: Secondary | ICD-10-CM | POA: Diagnosis not present

## 2023-03-12 NOTE — Telephone Encounter (Signed)
Have we seen this form yet?

## 2023-03-13 NOTE — Telephone Encounter (Signed)
I would say lets get her scheduled for preoperative clearance for next week Jade.  She will need an EKG.

## 2023-03-17 ENCOUNTER — Encounter: Payer: Self-pay | Admitting: Physician Assistant

## 2023-03-17 ENCOUNTER — Ambulatory Visit: Payer: 59 | Admitting: Physician Assistant

## 2023-03-17 VITALS — BP 121/63 | HR 64 | Ht 64.0 in | Wt 221.0 lb

## 2023-03-17 DIAGNOSIS — Z01818 Encounter for other preprocedural examination: Secondary | ICD-10-CM

## 2023-03-17 DIAGNOSIS — Z6834 Body mass index (BMI) 34.0-34.9, adult: Secondary | ICD-10-CM

## 2023-03-17 DIAGNOSIS — F331 Major depressive disorder, recurrent, moderate: Secondary | ICD-10-CM

## 2023-03-17 DIAGNOSIS — G894 Chronic pain syndrome: Secondary | ICD-10-CM | POA: Diagnosis not present

## 2023-03-17 DIAGNOSIS — M1711 Unilateral primary osteoarthritis, right knee: Secondary | ICD-10-CM

## 2023-03-17 DIAGNOSIS — E6609 Other obesity due to excess calories: Secondary | ICD-10-CM

## 2023-03-17 MED ORDER — VENLAFAXINE HCL ER 150 MG PO CP24
ORAL_CAPSULE | ORAL | 1 refills | Status: DC
Start: 2023-03-17 — End: 2023-12-31

## 2023-03-17 NOTE — Progress Notes (Signed)
Established Patient Office Visit  Subjective   Patient ID: Tracy Landry, female    DOB: 1960/10/29  Age: 62 y.o. MRN: [id number]    HPI Pt is a 62 yo obese female who presents to the clinic for surgical clearance for right total knee replacement. She is doing well with no complaints. She is managing her mood. She is eating less and lost 7lbs. She never started vraylar. Denies any dysuria, SOB, CP, cough, URI symptoms.   She has PMH of asthma, HTN, SVT.   Marland Kitchen. Active Ambulatory Problems    Diagnosis Date Noted   HYPERLIPIDEMIA 08/22/2006   Class 2 severe obesity due to excess calories with serious comorbidity and body mass index (BMI) of 37.0 to 37.9 in adult (HCC) 06/01/2006   ASTHMA, PERSISTENT, MILD 08/12/2006   GASTROESOPHAGEAL REFLUX, NO ESOPHAGITIS 05/13/2006   ANKYLOSING SPONDYLITIS 01/06/2007   COUGH 05/15/2010   SVT (supraventricular tachycardia) 04/15/2012   Fatigue 04/15/2012   Essential hypertension 07/15/2012   Primary osteoarthritis, right shoulder 02/03/2014   Plantar fasciitis, left 06/09/2014   Anxiety 03/28/2015   Low back pain 02/22/2016   Primary osteoarthritis of right knee, status post left total knee arthroplasty/revision patellectomy 05/08/2016   Periscapular pain 06/03/2016   Depression 03/15/2011   Primary osteoarthritis of first carpometacarpal joint of right hand 03/26/2018   Acute medial meniscus tear of left knee 03/26/2019   Arthritis of knee, left 03/15/2019   Asthma 08/12/2006   Dupuytren's disease 09/30/2018   Family history of diabetes mellitus in mother 04/01/2019   Impaired fasting glucose 04/02/2019   S/P TKR (total knee replacement) using cement, left 05/07/2019   Strain of rhomboid muscle 03/12/2021   Moderate episode of recurrent major depressive disorder (HCC) 03/12/2021   Chronic pain syndrome 03/12/2021   Non-restorative sleep 03/12/2021   Snoring 03/12/2021   Insufficiency fracture of medial femoral condyle (HCC) 01/30/2022    Insomnia 03/11/2022   Calcific tendinitis of right shoulder 06/14/2019   Pain 10/30/2015   Class 1 obesity due to excess calories without serious comorbidity with body mass index (BMI) of 34.0 to 34.9 in adult 03/17/2023   Resolved Ambulatory Problems    Diagnosis Date Noted   VIRAL URI 07/14/2007   RHINITIS, ALLERGIC 05/13/2006   OSTEOPENIA 05/13/2006   Palpitations 08/03/2007   CHEST PAIN, RIGHT 05/15/2010   Essential hypertension, malignant 04/15/2012   Thrush of mouth and esophagus (HCC) 03/26/2013   Peroneal tendinitis of left lower leg 04/01/2014   Left tibialis posterior tendonitis 08/08/2014   Puncture wound 09/13/2015   Right wrist pain 09/26/2016   Closed Colles' fracture of right radius 10/30/2015   Estrogen deficiency 01/30/2022   Past Medical History:  Diagnosis Date   Back pain    GERD (gastroesophageal reflux disease)    Hypertension    PONV (postoperative nausea and vomiting)    Wears glasses      ROS   See HPI.  Objective:     BP 121/63   Pulse 64   Ht 5\' 4"  (1.626 m)   Wt 221 lb (100.2 kg)   LMP 10/28/2006   SpO2 99%   BMI 37.93 kg/m  BP Readings from Last 3 Encounters:  03/17/23 121/63  01/14/23 (!) 160/92  12/02/22 (!) 156/77   Wt Readings from Last 3 Encounters:  03/17/23 221 lb (100.2 kg)  12/02/22 228 lb (103.4 kg)  03/14/22 229 lb 8 oz (104.1 kg)      Physical Exam Constitutional:  Appearance: Normal appearance. She is obese.  HENT:     Head: Normocephalic.  Cardiovascular:     Rate and Rhythm: Normal rate and regular rhythm.     Pulses: Normal pulses.     Heart sounds: Normal heart sounds.  Pulmonary:     Effort: Pulmonary effort is normal.  Musculoskeletal:     Cervical back: Normal range of motion and neck supple. No tenderness.     Right lower leg: No edema.     Left lower leg: No edema.  Lymphadenopathy:     Cervical: No cervical adenopathy.  Neurological:     General: No focal deficit present.     Mental  Status: She is alert and oriented to person, place, and time.  Psychiatric:        Mood and Affect: Mood normal.        The 10-year ASCVD risk score (Arnett DK, et al., 2019) is: 3%    Assessment & Plan:  Marland KitchenMarland KitchenRinaldo Cloud "[name]" was seen today for medical management of chronic issues.  Diagnoses and all orders for this visit:  Preoperative clearance -     EKG 12-Lead  Chronic pain syndrome -     venlafaxine XR (EFFEXOR-XR) 150 MG 24 hr capsule; TAKE 1 CAPSULE BY MOUTH ONCE DAILY WITH BREAKFAST  Primary osteoarthritis of right knee  Moderate episode of recurrent major depressive disorder (HCC) -     venlafaxine XR (EFFEXOR-XR) 150 MG 24 hr capsule; TAKE 1 CAPSULE BY MOUTH ONCE DAILY WITH BREAKFAST  Class 1 obesity due to excess calories without serious comorbidity with body mass index (BMI) of 34.0 to 34.9 in adult   Ortho to do blood work EKG NSR with some non-specific changes in inferior leads but not concerning after personal review and comparison to 2014 EKG No ST elevation/depression or arrhythmia.  Pt is asymptomatic for any further work up No concerns for surgery If starting to feel unwell before surgery follow up as soon as possible.    Tandy Gaw, PA-C

## 2023-03-19 ENCOUNTER — Ambulatory Visit (INDEPENDENT_AMBULATORY_CARE_PROVIDER_SITE_OTHER): Payer: 59

## 2023-03-19 DIAGNOSIS — Z1231 Encounter for screening mammogram for malignant neoplasm of breast: Secondary | ICD-10-CM

## 2023-03-25 NOTE — Telephone Encounter (Signed)
Error

## 2023-03-26 ENCOUNTER — Other Ambulatory Visit: Payer: Self-pay | Admitting: Physician Assistant

## 2023-03-26 ENCOUNTER — Ambulatory Visit: Payer: 59

## 2023-04-08 DIAGNOSIS — Z01812 Encounter for preprocedural laboratory examination: Secondary | ICD-10-CM | POA: Diagnosis not present

## 2023-04-08 DIAGNOSIS — M25561 Pain in right knee: Secondary | ICD-10-CM | POA: Diagnosis not present

## 2023-04-08 DIAGNOSIS — M1711 Unilateral primary osteoarthritis, right knee: Secondary | ICD-10-CM | POA: Diagnosis not present

## 2023-04-13 ENCOUNTER — Other Ambulatory Visit: Payer: Self-pay | Admitting: Physician Assistant

## 2023-04-13 DIAGNOSIS — G894 Chronic pain syndrome: Secondary | ICD-10-CM

## 2023-04-20 ENCOUNTER — Encounter: Payer: Self-pay | Admitting: Physician Assistant

## 2023-04-25 DIAGNOSIS — G8918 Other acute postprocedural pain: Secondary | ICD-10-CM | POA: Diagnosis not present

## 2023-04-25 DIAGNOSIS — M1711 Unilateral primary osteoarthritis, right knee: Secondary | ICD-10-CM | POA: Diagnosis not present

## 2023-04-28 DIAGNOSIS — M25661 Stiffness of right knee, not elsewhere classified: Secondary | ICD-10-CM | POA: Diagnosis not present

## 2023-04-28 DIAGNOSIS — M6281 Muscle weakness (generalized): Secondary | ICD-10-CM | POA: Diagnosis not present

## 2023-04-28 DIAGNOSIS — R262 Difficulty in walking, not elsewhere classified: Secondary | ICD-10-CM | POA: Diagnosis not present

## 2023-04-28 DIAGNOSIS — M1711 Unilateral primary osteoarthritis, right knee: Secondary | ICD-10-CM | POA: Diagnosis not present

## 2023-05-01 DIAGNOSIS — M6281 Muscle weakness (generalized): Secondary | ICD-10-CM | POA: Diagnosis not present

## 2023-05-01 DIAGNOSIS — R262 Difficulty in walking, not elsewhere classified: Secondary | ICD-10-CM | POA: Diagnosis not present

## 2023-05-01 DIAGNOSIS — M1711 Unilateral primary osteoarthritis, right knee: Secondary | ICD-10-CM | POA: Diagnosis not present

## 2023-05-01 DIAGNOSIS — M25661 Stiffness of right knee, not elsewhere classified: Secondary | ICD-10-CM | POA: Diagnosis not present

## 2023-05-05 DIAGNOSIS — R262 Difficulty in walking, not elsewhere classified: Secondary | ICD-10-CM | POA: Diagnosis not present

## 2023-05-05 DIAGNOSIS — M25661 Stiffness of right knee, not elsewhere classified: Secondary | ICD-10-CM | POA: Diagnosis not present

## 2023-05-05 DIAGNOSIS — M1711 Unilateral primary osteoarthritis, right knee: Secondary | ICD-10-CM | POA: Diagnosis not present

## 2023-05-05 DIAGNOSIS — M6281 Muscle weakness (generalized): Secondary | ICD-10-CM | POA: Diagnosis not present

## 2023-05-08 DIAGNOSIS — M25661 Stiffness of right knee, not elsewhere classified: Secondary | ICD-10-CM | POA: Diagnosis not present

## 2023-05-08 DIAGNOSIS — M6281 Muscle weakness (generalized): Secondary | ICD-10-CM | POA: Diagnosis not present

## 2023-05-08 DIAGNOSIS — R262 Difficulty in walking, not elsewhere classified: Secondary | ICD-10-CM | POA: Diagnosis not present

## 2023-05-08 DIAGNOSIS — M1711 Unilateral primary osteoarthritis, right knee: Secondary | ICD-10-CM | POA: Diagnosis not present

## 2023-05-12 DIAGNOSIS — R262 Difficulty in walking, not elsewhere classified: Secondary | ICD-10-CM | POA: Diagnosis not present

## 2023-05-12 DIAGNOSIS — M25661 Stiffness of right knee, not elsewhere classified: Secondary | ICD-10-CM | POA: Diagnosis not present

## 2023-05-12 DIAGNOSIS — M6281 Muscle weakness (generalized): Secondary | ICD-10-CM | POA: Diagnosis not present

## 2023-05-12 DIAGNOSIS — M1711 Unilateral primary osteoarthritis, right knee: Secondary | ICD-10-CM | POA: Diagnosis not present

## 2023-05-15 DIAGNOSIS — M6281 Muscle weakness (generalized): Secondary | ICD-10-CM | POA: Diagnosis not present

## 2023-05-15 DIAGNOSIS — M25661 Stiffness of right knee, not elsewhere classified: Secondary | ICD-10-CM | POA: Diagnosis not present

## 2023-05-15 DIAGNOSIS — M1711 Unilateral primary osteoarthritis, right knee: Secondary | ICD-10-CM | POA: Diagnosis not present

## 2023-05-15 DIAGNOSIS — R262 Difficulty in walking, not elsewhere classified: Secondary | ICD-10-CM | POA: Diagnosis not present

## 2023-05-19 DIAGNOSIS — M1711 Unilateral primary osteoarthritis, right knee: Secondary | ICD-10-CM | POA: Diagnosis not present

## 2023-05-19 DIAGNOSIS — R262 Difficulty in walking, not elsewhere classified: Secondary | ICD-10-CM | POA: Diagnosis not present

## 2023-05-19 DIAGNOSIS — M25661 Stiffness of right knee, not elsewhere classified: Secondary | ICD-10-CM | POA: Diagnosis not present

## 2023-05-19 DIAGNOSIS — M6281 Muscle weakness (generalized): Secondary | ICD-10-CM | POA: Diagnosis not present

## 2023-05-22 DIAGNOSIS — M1711 Unilateral primary osteoarthritis, right knee: Secondary | ICD-10-CM | POA: Diagnosis not present

## 2023-05-22 DIAGNOSIS — M25661 Stiffness of right knee, not elsewhere classified: Secondary | ICD-10-CM | POA: Diagnosis not present

## 2023-05-22 DIAGNOSIS — M6281 Muscle weakness (generalized): Secondary | ICD-10-CM | POA: Diagnosis not present

## 2023-05-22 DIAGNOSIS — R262 Difficulty in walking, not elsewhere classified: Secondary | ICD-10-CM | POA: Diagnosis not present

## 2023-05-26 DIAGNOSIS — R262 Difficulty in walking, not elsewhere classified: Secondary | ICD-10-CM | POA: Diagnosis not present

## 2023-05-26 DIAGNOSIS — M25661 Stiffness of right knee, not elsewhere classified: Secondary | ICD-10-CM | POA: Diagnosis not present

## 2023-05-26 DIAGNOSIS — M6281 Muscle weakness (generalized): Secondary | ICD-10-CM | POA: Diagnosis not present

## 2023-05-26 DIAGNOSIS — M1711 Unilateral primary osteoarthritis, right knee: Secondary | ICD-10-CM | POA: Diagnosis not present

## 2023-05-29 DIAGNOSIS — M6281 Muscle weakness (generalized): Secondary | ICD-10-CM | POA: Diagnosis not present

## 2023-05-29 DIAGNOSIS — R262 Difficulty in walking, not elsewhere classified: Secondary | ICD-10-CM | POA: Diagnosis not present

## 2023-05-29 DIAGNOSIS — M25661 Stiffness of right knee, not elsewhere classified: Secondary | ICD-10-CM | POA: Diagnosis not present

## 2023-05-29 DIAGNOSIS — M1711 Unilateral primary osteoarthritis, right knee: Secondary | ICD-10-CM | POA: Diagnosis not present

## 2023-06-05 DIAGNOSIS — M25661 Stiffness of right knee, not elsewhere classified: Secondary | ICD-10-CM | POA: Diagnosis not present

## 2023-06-05 DIAGNOSIS — R262 Difficulty in walking, not elsewhere classified: Secondary | ICD-10-CM | POA: Diagnosis not present

## 2023-06-05 DIAGNOSIS — M1711 Unilateral primary osteoarthritis, right knee: Secondary | ICD-10-CM | POA: Diagnosis not present

## 2023-06-05 DIAGNOSIS — M6281 Muscle weakness (generalized): Secondary | ICD-10-CM | POA: Diagnosis not present

## 2023-06-27 ENCOUNTER — Other Ambulatory Visit: Payer: Self-pay | Admitting: Physician Assistant

## 2023-07-01 ENCOUNTER — Ambulatory Visit (INDEPENDENT_AMBULATORY_CARE_PROVIDER_SITE_OTHER): Payer: 59 | Admitting: Family Medicine

## 2023-07-01 ENCOUNTER — Encounter: Payer: Self-pay | Admitting: Family Medicine

## 2023-07-01 VITALS — BP 140/83 | HR 94 | Temp 99.4°F | Resp 18 | Ht 64.0 in | Wt 212.4 lb

## 2023-07-01 DIAGNOSIS — R059 Cough, unspecified: Secondary | ICD-10-CM

## 2023-07-01 DIAGNOSIS — J4520 Mild intermittent asthma, uncomplicated: Secondary | ICD-10-CM

## 2023-07-01 MED ORDER — ALBUTEROL SULFATE HFA 108 (90 BASE) MCG/ACT IN AERS
2.0000 | INHALATION_SPRAY | Freq: Four times a day (QID) | RESPIRATORY_TRACT | 0 refills | Status: AC | PRN
Start: 2023-07-01 — End: ?

## 2023-07-01 MED ORDER — METHYLPREDNISOLONE 4 MG PO TBPK: ORAL_TABLET | ORAL | 0 refills | Status: DC

## 2023-07-01 MED ORDER — PROMETHAZINE-DM 6.25-15 MG/5ML PO SYRP
5.0000 mL | ORAL_SOLUTION | Freq: Every evening | ORAL | 0 refills | Status: DC | PRN
Start: 2023-07-01 — End: 2023-09-18

## 2023-07-01 NOTE — Patient Instructions (Signed)
Viral illnesses can take up to 10 days to resolve.   There is no role for an antibiotic.  Symptomatic treatment is ideal.   Take over the counter pain medication as needed. Acetaminophen and ibuprofen for fever and body aches. Mucinex and Robitussin for cough, if you have high blood pressure, take Coricidin for cough. Honey is also effective for cough, avoid if diabetic. Flonase or saline nasal spray for nasal congestion.    Read and follow instructions on the label and make sure not to combine other medications that may have same ingredients in it. It is important to not take too much.    Drink plenty of caffeine-free fluids. (If you have heart or kidney problems, follow the instructions of your specialist regarding amounts). Adequate fluids will help you to avoid dehydration.   If you are hungry, eat a bland diet, such as the BRAT diet (bananas, rice, applesauce, toast). Diet as tolerated if appetite is normal.   Get lots of rest.  Let us know if you are not improving or getting worse.

## 2023-07-01 NOTE — Assessment & Plan Note (Signed)
Reports wheezing at home, using albuterol inhaler. Needs refill. No wheezing noted on ausculation. Lungs are clear in office. Continue to monitor.

## 2023-07-01 NOTE — Assessment & Plan Note (Addendum)
Symptoms present for 2 days. Using albuterol inhaler as needed. Lungs clear with oxygen saturation 97% in office. No obvious shortness of breath. Conversing well with provider. Medrol dose pack given for frequent coughing and history of bronchitis.  Promethazine-DM 6.25-15 mg/5 ml given to use up to two times per day if not driving, otherwise, take at night. Supportive therapy. Hydration. Follow-up as needed.  POC influenza: negative POC Covid: negative

## 2023-07-01 NOTE — Progress Notes (Signed)
Established Patient Office Visit  Subjective   Patient ID: Tracy Landry, female    DOB: 1961/04/07  Age: 63 y.o. MRN: [id number]  Chief Complaint  Patient presents with   URI    Patient states that she has had symptoms of a sore throat, coughing,and body aches since Sunday. Patient also reports having a low grade fever    HPI Sore throat, coughing, body aches, and low grade fever.   Sneezing and "went into my chest yesterday"  Feels "rumbling" in chest when coughing. History of bronchitis. Symptoms have been present for 2 days.  POC flu: negative in office  POC Covid: negative in office.    Review of Systems  Constitutional:  Positive for fever.  HENT:  Positive for congestion and sore throat.   Respiratory:  Positive for cough, shortness of breath (using inhaler) and wheezing.   Gastrointestinal:  Negative for nausea and vomiting.      Objective:     BP (!) 140/83   Pulse 94   Temp 99.4 F (37.4 C) (Oral)   Resp 18   Ht 5\' 4"  (1.626 m)   Wt 212 lb 6.4 oz (96.3 kg)   LMP 10/28/2006   SpO2 97%   BMI 36.46 kg/m  BP Readings from Last 3 Encounters:  07/01/23 (!) 140/83  03/17/23 121/63  01/14/23 (!) 160/92      Physical Exam Vitals and nursing note reviewed.  Constitutional:      General: She is not in acute distress.    Appearance: Normal appearance. She is obese.  Cardiovascular:     Heart sounds: Normal heart sounds.  Pulmonary:     Effort: Pulmonary effort is normal. No respiratory distress.     Breath sounds: Normal breath sounds. No wheezing.  Skin:    General: Skin is warm and dry.  Neurological:     General: No focal deficit present.     Mental Status: She is alert. Mental status is at baseline.  Psychiatric:        Mood and Affect: Mood normal.        Behavior: Behavior normal.        Thought Content: Thought content normal.        Judgment: Judgment normal.     No results found for any visits on 07/01/23.    The 10-year ASCVD  risk score (Arnett DK, et al., 2019) is: 4%    Assessment & Plan:   Problem List Items Addressed This Visit     Asthma    Reports wheezing at home, using albuterol inhaler. Needs refill. No wheezing noted on ausculation. Lungs are clear in office. Continue to monitor.       Relevant Medications   albuterol (VENTOLIN HFA) 108 (90 Base) MCG/ACT inhaler   methylPREDNISolone (MEDROL DOSEPAK) 4 MG TBPK tablet   Cough in adult patient - Primary    Symptoms present for 2 days. Using albuterol inhaler as needed. Lungs clear with oxygen saturation 97% in office. No obvious shortness of breath. Conversing well with provider. Medrol dose pack given for frequent coughing and history of bronchitis.  Promethazine-DM 6.25-15 mg/5 ml given to use up to two times per day if not driving, otherwise, take at night. Supportive therapy. Hydration. Follow-up as needed.  POC influenza: negative POC Covid: negative       Relevant Medications   methylPREDNISolone (MEDROL DOSEPAK) 4 MG TBPK tablet   promethazine-dextromethorphan (PROMETHAZINE-DM) 6.25-15 MG/5ML syrup  Agrees with plan of care discussed.  Questions answered.   Return if symptoms worsen or fail to improve.    Novella Olive, FNP

## 2023-07-15 ENCOUNTER — Other Ambulatory Visit: Payer: Self-pay | Admitting: Physician Assistant

## 2023-07-18 ENCOUNTER — Other Ambulatory Visit: Payer: Self-pay | Admitting: Physician Assistant

## 2023-07-18 DIAGNOSIS — M898X1 Other specified disorders of bone, shoulder: Secondary | ICD-10-CM

## 2023-08-15 ENCOUNTER — Other Ambulatory Visit: Payer: Self-pay | Admitting: Physician Assistant

## 2023-08-15 DIAGNOSIS — G894 Chronic pain syndrome: Secondary | ICD-10-CM

## 2023-08-25 ENCOUNTER — Other Ambulatory Visit: Payer: Self-pay | Admitting: Sports Medicine

## 2023-08-25 DIAGNOSIS — M1711 Unilateral primary osteoarthritis, right knee: Secondary | ICD-10-CM

## 2023-09-04 ENCOUNTER — Encounter: Payer: Self-pay | Admitting: Sports Medicine

## 2023-09-10 ENCOUNTER — Other Ambulatory Visit: Payer: Self-pay | Admitting: Physician Assistant

## 2023-09-10 DIAGNOSIS — G894 Chronic pain syndrome: Secondary | ICD-10-CM

## 2023-09-18 ENCOUNTER — Ambulatory Visit (INDEPENDENT_AMBULATORY_CARE_PROVIDER_SITE_OTHER): Payer: 59

## 2023-09-18 ENCOUNTER — Ambulatory Visit
Admission: EM | Admit: 2023-09-18 | Discharge: 2023-09-18 | Disposition: A | Discharge status: Home / Self Care | Payer: 59 | Attending: Family Medicine | Admitting: Family Medicine

## 2023-09-18 DIAGNOSIS — W19XXXA Unspecified fall, initial encounter: Secondary | ICD-10-CM

## 2023-09-18 DIAGNOSIS — M799 Soft tissue disorder, unspecified: Secondary | ICD-10-CM | POA: Diagnosis not present

## 2023-09-18 DIAGNOSIS — S62645A Nondisplaced fracture of proximal phalanx of left ring finger, initial encounter for closed fracture: Secondary | ICD-10-CM | POA: Diagnosis not present

## 2023-09-18 DIAGNOSIS — M79645 Pain in left finger(s): Secondary | ICD-10-CM

## 2023-09-18 DIAGNOSIS — S2231XA Fracture of one rib, right side, initial encounter for closed fracture: Secondary | ICD-10-CM

## 2023-09-18 DIAGNOSIS — M19042 Primary osteoarthritis, left hand: Secondary | ICD-10-CM | POA: Diagnosis not present

## 2023-09-18 DIAGNOSIS — Z043 Encounter for examination and observation following other accident: Secondary | ICD-10-CM | POA: Diagnosis not present

## 2023-09-18 DIAGNOSIS — R0781 Pleurodynia: Secondary | ICD-10-CM

## 2023-09-18 MED ORDER — TRAMADOL HCL 50 MG PO TABS: 50.0000 mg | ORAL_TABLET | Freq: Four times a day (QID) | ORAL | 0 refills | Status: DC | PRN

## 2023-09-18 NOTE — ED Provider Notes (Addendum)
[facility]    CSN: [id number] Arrival date & time: 09/18/23  1109      History   Chief Complaint Chief Complaint  Patient presents with   Fall    RT rib   Finger Injury    LT ring    HPI [name] is a 63 y.o. female.    Fall  Here for pain in her right side and in her left ring finger.  She was coming in with a box of dog food when she slipped and fell onto her right side last night.  She also hit her right shoulder but that is not bothering her that much now.    She is hurting in her right lower rib cage in the anterior axillary line, and is hurting and swelling in her left ring finger.  Codeine makes her itch, but she has tolerated tramadol in the past.  She is also allergic to chlorhexidine and Augmentin. 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   Wears glasses     Patient Active Problem List   Diagnosis Date Noted   Class 1 obesity due to excess calories without serious comorbidity with body mass index (BMI) of 34.0 to 34.9 in adult 03/17/2023   Insomnia 03/11/2022   Insufficiency fracture of medial femoral condyle (HCC) 01/30/2022   Strain of rhomboid muscle 03/12/2021   Moderate episode of recurrent major depressive disorder (HCC) 03/12/2021   Chronic pain syndrome 03/12/2021   Non-restorative sleep 03/12/2021   Snoring 03/12/2021   Calcific tendinitis of right shoulder 06/14/2019   S/P TKR (total knee replacement) using cement, left 05/07/2019   Impaired fasting glucose 04/02/2019   Family history of diabetes mellitus in mother 04/01/2019   Acute medial meniscus tear of left knee 03/26/2019   Arthritis of knee, left 03/15/2019   Dupuytren's disease 09/30/2018   Primary osteoarthritis of first carpometacarpal joint of  right hand 03/26/2018   Periscapular pain 06/03/2016   Primary osteoarthritis of right knee, status post left total knee arthroplasty/revision patellectomy 05/08/2016   Low back pain 02/22/2016   Pain 10/30/2015   Anxiety 03/28/2015   Plantar fasciitis, left 06/09/2014   Primary osteoarthritis, right shoulder 02/03/2014   Essential hypertension 07/15/2012   SVT (supraventricular tachycardia) (HCC) 04/15/2012   Fatigue 04/15/2012   Depression 03/15/2011   Cough in adult patient 05/15/2010   ANKYLOSING SPONDYLITIS 01/06/2007   HYPERLIPIDEMIA 08/22/2006   Asthma 08/12/2006   Asthma 08/12/2006   Class 2 severe obesity due to excess calories with serious comorbidity and body mass index (BMI) of 37.0 to 37.9 in adult (HCC) 06/01/2006   GASTROESOPHAGEAL REFLUX, NO ESOPHAGITIS 05/13/2006    Past Surgical History:  Procedure Laterality Date   ABLATION OF DYSRHYTHMIC FOCUS  10/07/2012   SVT   APPENDECTOMY     FOOT SURGERY  2000   rt foot fx   HARDWARE REMOVAL  2003   rt arm   KNEE ARTHROSCOPY WITH MEDIAL MENISECTOMY Right 03/14/2022   Procedure: KNEE ARTHROSCOPY WITH MEDIAL MENISCAL ROOT REPAIR;  Surgeon: Bjorn Pippin, MD;  Location: [facility];  Service: Orthopedics;  Laterality: Right;   MASS EXCISION Left 07/07/2013   Procedure: EXCISION MASS LEFT RING FINGER;  Surgeon: Jillyn Hidden  Georges Lynch, MD;  Location: [facility];  Service: Orthopedics;  Laterality: Left;   PATELLECTOMY     REPAIR EXTENSOR TENDON Left 07/07/2013   Procedure: REPAIR EXTENSOR TENDON LEFT WITH DEBRIDMENT PROXIMAL INTERPHALANGEAL JOINT;  Surgeon: Nicki Reaper, MD;  Location: [facility];  Service: Orthopedics;  Laterality: Left;   right arm reconstruction  08/1999   SUPRAVENTRICULAR TACHYCARDIA ABLATION N/A 10/07/2012   Procedure: SUPRAVENTRICULAR TACHYCARDIA ABLATION;  Surgeon: Marinus Maw, MD;  Location: [facility] C[facility];  Service: Cardiovascular;  Laterality: N/A;   TOTAL  ABDOMINAL HYSTERECTOMY  10/2007   TOTAL KNEE ARTHROPLASTY      OB History     Gravida  0   Para  0   Term  0   Preterm  0   AB  0   Living  0      SAB  0   IAB  0   Ectopic  0   Multiple  0   Live Births  0            Home Medications    Prior to Admission medications   Medication Sig Start Date End Date Taking? Authorizing Provider  albuterol (VENTOLIN HFA) 108 (90 Base) MCG/ACT inhaler Inhale 2 puffs into the lungs every 6 (six) hours as needed. 07/01/23   Novella Olive, FNP  cyclobenzaprine (FLEXERIL) 10 MG tablet TAKE 1 TABLET BY MOUTH AT BEDTIME 07/18/23   Breeback, Jade L, PA-C  diclofenac (VOLTAREN) 75 MG EC tablet Take 1 tablet by mouth twice daily 06/27/23   Breeback, Jade L, PA-C  esomeprazole (NEXIUM) 20 MG capsule Take by mouth.    [provider]  gabapentin (NEURONTIN) 300 MG capsule TAKE 1 CAPSULE BY MOUTH THREE TIMES DAILY 09/10/23   Breeback, Jade L, PA-C  montelukast (SINGULAIR) 10 MG tablet TAKE 1 TABLET BY MOUTH AT BEDTIME 07/15/23   Breeback, Lonna Cobb, PA-C  NONFORMULARY OR COMPOUNDED ITEM [employer]  #14. Scar Cream  60mg     [provider]  pravastatin (PRAVACHOL) 20 MG tablet Take 1 tablet by mouth once daily 07/15/23   Breeback, Jade L, PA-C  promethazine (PHENERGAN) 12.5 MG tablet Take 1 tablet (12.5 mg total) by mouth every 6 (six) hours as needed for nausea or vomiting. 03/14/22   Bjorn Pippin, MD  traMADol (ULTRAM) 50 MG tablet Take 1 tablet (50 mg total) by mouth every 6 (six) hours as needed (pain). 09/18/23   Zenia Resides, MD  traZODone (DESYREL) 50 MG tablet TAKE 1/2 TO 1 (ONE-HALF TO ONE) TABLET BY MOUTH AT BEDTIME AS NEEDED FOR SLEEP 04/29/22   Breeback, Jade L, PA-C  triamcinolone cream (KENALOG) 0.1 % APPLY  CREAM EXTERNALLY TWICE DAILY TO  LOWER  LEG  RASH 12/31/22   Breeback, Jade L, PA-C  venlafaxine XR (EFFEXOR-XR) 150 MG 24 hr capsule TAKE 1 CAPSULE BY MOUTH ONCE DAILY WITH BREAKFAST 03/17/23    Jomarie Longs, PA-C    Family History Family History  Problem Relation Age of Onset   Alcohol abuse Mother    Diabetes Mother    Hyperlipidemia Mother    Hypertension Mother    Heart disease Mother        Rheumatic disease   Alzheimer's disease Father    Diabetes Sister    Hyperlipidemia Sister    Hypertension Sister    Heart attack Maternal Uncle    Lung cancer Maternal Grandmother        lung  Heart attack Maternal Grandfather     Social History Social History   Tobacco Use   Smoking status: Never   Smokeless tobacco: Never  Vaping Use   Vaping status: Former  Substance Use Topics   Alcohol use: Yes    Comment: 3 a week   Drug use: No     Allergies   Chlorhexidine gluconate, Amoxicillin-pot clavulanate, and Codeine sulfate   Review of Systems Review of Systems   Physical Exam Triage Vital Signs ED Triage Vitals  Encounter Vitals Group     BP 09/18/23 1139 (!) 141/82     Systolic BP Percentile --      Diastolic BP Percentile --      Pulse Rate 09/18/23 1139 81     Resp 09/18/23 1139 17     Temp 09/18/23 1139 97.9 F (36.6 C)     Temp Source 09/18/23 1139 Oral     SpO2 09/18/23 1139 98 %     Weight --      Height --      Head Circumference --      Peak Flow --      Pain Score 09/18/23 1140 7     Pain Loc --      Pain Education --      Exclude from Growth Chart --    No data found.  Updated Vital Signs BP (!) 141/82 (BP Location: Left Arm)   Pulse 81   Temp 97.9 F (36.6 C) (Oral)   Resp 17   LMP 10/28/2006   SpO2 98%   Visual Acuity Right Eye Distance:   Left Eye Distance:   Bilateral Distance:    Right Eye Near:   Left Eye Near:    Bilateral Near:     Physical Exam Vitals reviewed.  Constitutional:      General: She is not in acute distress.    Appearance: She is not toxic-appearing.  HENT:     Mouth/Throat:     Mouth: Mucous membranes are moist.  Eyes:     Extraocular Movements: Extraocular movements intact.      Pupils: Pupils are equal, round, and reactive to light.  Chest:     Chest wall: Tenderness (right lower ribs in the anterior axillary line.) present.  Musculoskeletal:     Cervical back: Neck supple.     Comments: The left ring finger is swollen and she is unable to flex it much.  There is also some ecchymosis around the PIP  Skin:    Coloration: Skin is not jaundiced or pale.  Neurological:     General: No focal deficit present.     Mental Status: She is alert and oriented to person, place, and time.  Psychiatric:        Behavior: Behavior normal.      UC Treatments / Results  Labs (all labs ordered are listed, but only abnormal results are displayed) Labs Reviewed - No data to display  EKG   Radiology DG Finger Ring Left Result Date: 09/18/2023 CLINICAL DATA:  Pain after fall EXAM: LEFT RING FINGER 3V COMPARISON:  None Available. FINDINGS: No fracture or dislocation. Mild degenerative changes seen along the distal interphalangeal joint with small osteophytes. There is also some degenerative changes of the fifth digit at the edge of the imaging field on lateral view. Soft tissue swelling seen about the proximal aspect of the digit. IMPRESSION: Slight degenerative changes.  Soft tissue swelling. Electronically Signed   By: Piedad Climes.D.  On: 09/18/2023 14:48   DG Ribs Unilateral W/Chest Right Result Date: 09/18/2023 CLINICAL DATA:  Rib pain after fall EXAM: RIGHT RIBS AND CHEST - 3 VIEW COMPARISON:  X-ray 01/17/2020 FINDINGS: No fracture or other bone lesions are seen involving the ribs. There is no evidence of pneumothorax or pleural effusion. Both lungs are clear. Heart size and mediastinal contours are within normal limits. IMPRESSION: No rib fracture identified by x-ray. No pleural effusion or pneumothorax. Electronically Signed   By: Karen Kays M.D.   On: 09/18/2023 14:46    Procedures Procedures (including critical care time)  Medications Ordered in UC Medications -  No data to display  Initial Impression / Assessment and Plan / UC Course  I have reviewed the triage vital signs and the nursing notes.  Pertinent labs & imaging results that were available during my care of the patient were reviewed by me and considered in my medical decision making (see chart for details).     By my review I see a tiny fracture in one of the lower ribs.  Also I see possible fracture of the distal portion of the proximal phalanx of the left ring finger.  Splint is applied to the finger.  She is given incentive spirometer and tramadol sent in for pain.  She is given contact information for hand specialist.   Xray report finalized about 2 hours after done: no fracture on those reports. I let pt know her xray report states no fracture on either set of films  Final Clinical Impressions(s) / UC Diagnoses   Final diagnoses:  Rib pain on right side  Finger pain, left  Closed nondisplaced fracture of proximal phalanx of left ring finger, initial encounter  Closed fracture of one rib of right side, initial encounter     Discharge Instructions      By my review there is a fracture of the rib in your lower chest and I think you have a fracture of the first portion of your ring finger also.  The radiologist will also read your x-ray, and if their interpretation differs significantly from mine, and would change your management, we will call you.  Take tramadol 50 mg-- 1 tablet every 6 hours as needed for pain.  This medication can make you sleepy or dizzy   .     ED Prescriptions     Medication Sig Dispense Auth. Provider   traMADol (ULTRAM) 50 MG tablet  (Status: Discontinued) Take 1 tablet (50 mg total) by mouth every 6 (six) hours as needed (pain). 12 tablet Seleena Reimers, Janace Aris, MD   traMADol (ULTRAM) 50 MG tablet Take 1 tablet (50 mg total) by mouth every 6 (six) hours as needed (pain). 12 tablet Sherlene Rickel, Janace Aris, MD      I have reviewed the PDMP during this  encounter.   Zenia Resides, MD 09/18/23 1253    Zenia Resides, MD 09/18/23 [phone]

## 2023-09-18 NOTE — ED Triage Notes (Signed)
Pt c/o fall that occurred last night. Says she was carrying a box of dogfood in and tripped outside injuring her RT elbow, RT ribs and LT ring finger. Pain 7/10 Taking tylenol prn.

## 2023-09-18 NOTE — Discharge Instructions (Signed)
By my review there is a fracture of the rib in your lower chest and I think you have a fracture of the first portion of your ring finger also.  The radiologist will also read your x-ray, and if their interpretation differs significantly from mine, and would change your management, we will call you.  Take tramadol 50 mg-- 1 tablet every 6 hours as needed for pain.  This medication can make you sleepy or dizzy   .

## 2023-09-22 ENCOUNTER — Other Ambulatory Visit: Payer: Self-pay | Admitting: Physician Assistant

## 2023-10-10 ENCOUNTER — Other Ambulatory Visit: Payer: Self-pay | Admitting: Physician Assistant

## 2023-10-11 ENCOUNTER — Other Ambulatory Visit: Payer: Self-pay | Admitting: Physician Assistant

## 2023-10-11 DIAGNOSIS — G894 Chronic pain syndrome: Secondary | ICD-10-CM

## 2023-12-01 ENCOUNTER — Encounter: Payer: Self-pay | Admitting: Family Medicine

## 2023-12-01 ENCOUNTER — Ambulatory Visit: Admitting: Family Medicine

## 2023-12-01 VITALS — BP 130/85 | HR 89 | Temp 98.5°F | Resp 18 | Ht 64.0 in | Wt 204.6 lb

## 2023-12-01 DIAGNOSIS — R195 Other fecal abnormalities: Secondary | ICD-10-CM | POA: Diagnosis not present

## 2023-12-01 DIAGNOSIS — R110 Nausea: Secondary | ICD-10-CM | POA: Diagnosis not present

## 2023-12-01 DIAGNOSIS — R6881 Early satiety: Secondary | ICD-10-CM | POA: Insufficient documentation

## 2023-12-01 NOTE — Assessment & Plan Note (Addendum)
 4-5 loose stools per day with "gurgling" without regard to what has been consumed. Has a variety diet that does not seem to change symptoms.  Symptoms have not worsened or gotten better. CBC, CMP, stool for culture and O&P due to recent camping although suspicion is low of infectious exposure. Urgent referral to GI for evaluation. Normal abdominal exam in office today.  Await results of stool culture.

## 2023-12-01 NOTE — Progress Notes (Signed)
 Acute Office Visit  Subjective:     Patient ID: Tracy Landry, female    DOB: 15-Jan-1961, 63 y.o.   MRN: [id number]  Chief Complaint  Patient presents with   GI Problem    X3 wks nausea, constant growling, grumbling, and loose stools  Per patient request, she wants to transfer primary care to this provider.   HPI Patient is in today for nausea, constant growling and loose stools for three weeks.  Sudden onset. Not worse. Not better. 4-5 stools per day. Small in diameter.  Eating makes the gurgling and rumbling in stomach.  Appetite is decreased.  No recent antibiotic use. No abdominal pain. No fever. Has tried pro-biotics without improvement.  Camping in motor home after symptoms started.  No fresh water exposure while camping. No hiking or known exposures.     Review of Systems  Gastrointestinal:  Positive for diarrhea and nausea. Negative for abdominal pain, blood in stool and vomiting.        Objective:    BP 130/85 (BP Location: Left Arm, Patient Position: Sitting, Cuff Size: Normal)   Pulse 89   Temp 98.5 F (36.9 C) (Oral)   Resp 18   Ht 5\' 4"  (1.626 m)   Wt 204 lb 9 oz (92.8 kg)   LMP 10/28/2006   SpO2 100%   BMI 35.11 kg/m  BP Readings from Last 3 Encounters:  12/01/23 130/85  09/18/23 (!) 141/82  07/01/23 (!) 140/83      Physical Exam Vitals and nursing note reviewed.  Constitutional:      General: She is not in acute distress.    Appearance: Normal appearance.  Cardiovascular:     Rate and Rhythm: Regular rhythm.     Heart sounds: Normal heart sounds.  Pulmonary:     Effort: Pulmonary effort is normal.     Breath sounds: Normal breath sounds.  Abdominal:     General: Bowel sounds are normal. There is no distension.     Palpations: Abdomen is soft.     Tenderness: There is no abdominal tenderness.  Skin:    General: Skin is warm and dry.  Neurological:     General: No focal deficit present.     Mental Status: Mental status is  at baseline.  Psychiatric:        Mood and Affect: Mood normal.        Behavior: Behavior normal.        Thought Content: Thought content normal.        Judgment: Judgment normal.    No results found for any visits on 12/01/23.      Assessment & Plan:   Problem List Items Addressed This Visit     Nausea   Declines need for medication assistance to assist with nausea. Keeping fluids down. No vomiting. CBC and CMP today.       Relevant Orders   CBC   Comprehensive metabolic panel with GFR   Early satiety   Relevant Orders   Ambulatory referral to Gastroenterology   Cdiff NAA+O+P+Stool Culture   CBC   Comprehensive metabolic panel with GFR   Loose stools - Primary   4-5 loose stools per day with "gurgling" without regard to what has been consumed. Has a variety diet that does not seem to change symptoms.  Symptoms have not worsened or gotten better. CBC, CMP, stool for culture and O&P due to recent camping although suspicion is low of infectious exposure. Urgent referral to GI for  evaluation. Normal abdominal exam in office today.  Await results of stool culture.       Relevant Orders   Ambulatory referral to Gastroenterology   Cdiff NAA+O+P+Stool Culture   CBC   Comprehensive metabolic panel with GFR  Agrees with plan of care discussed.  Questions answered.      Return if symptoms worsen or fail to improve.  Mickiel Albany, FNP

## 2023-12-01 NOTE — Progress Notes (Signed)
gastr

## 2023-12-01 NOTE — Assessment & Plan Note (Signed)
 Declines need for medication assistance to assist with nausea. Keeping fluids down. No vomiting. CBC and CMP today.

## 2023-12-02 ENCOUNTER — Encounter: Payer: Self-pay | Admitting: Family Medicine

## 2023-12-02 DIAGNOSIS — R110 Nausea: Secondary | ICD-10-CM | POA: Diagnosis not present

## 2023-12-02 DIAGNOSIS — R195 Other fecal abnormalities: Secondary | ICD-10-CM | POA: Diagnosis not present

## 2023-12-02 DIAGNOSIS — R6881 Early satiety: Secondary | ICD-10-CM | POA: Diagnosis not present

## 2023-12-02 LAB — COMPREHENSIVE METABOLIC PANEL WITH GFR
ALT: 17 IU/L (ref 0–32)
AST: 22 IU/L (ref 0–40)
Albumin: 4.3 g/dL (ref 3.9–4.9)
Alkaline Phosphatase: 83 IU/L (ref 44–121)
BUN/Creatinine Ratio: 10 — ABNORMAL LOW (ref 12–28)
BUN: 9 mg/dL (ref 8–27)
Bilirubin Total: <0.2 mg/dL (ref 0.0–1.2)
CO2: 21 mmol/L (ref 20–29)
Calcium: 9.7 mg/dL (ref 8.7–10.3)
Chloride: 104 mmol/L (ref 96–106)
Creatinine, Ser: 0.94 mg/dL (ref 0.57–1.00)
Globulin, Total: 3.1 g/dL (ref 1.5–4.5)
Glucose: 117 mg/dL — ABNORMAL HIGH (ref 70–99)
Potassium: 4.2 mmol/L (ref 3.5–5.2)
Sodium: 141 mmol/L (ref 134–144)
Total Protein: 7.4 g/dL (ref 6.0–8.5)
eGFR: 69 mL/min/{1.73_m2} (ref >59)

## 2023-12-02 LAB — CBC
Hematocrit: 43.3 % (ref 34.0–46.6)
Hemoglobin: 14.3 g/dL (ref 11.1–15.9)
MCH: 30.8 pg (ref 26.6–33.0)
MCHC: 33 g/dL (ref 31.5–35.7)
MCV: 93 fL (ref 79–97)
Platelets: 337 10*3/uL (ref 150–450)
RBC: 4.65 x10E6/uL (ref 3.77–5.28)
RDW: 13 % (ref 11.7–15.4)
WBC: 6.4 10*3/uL (ref 3.4–10.8)

## 2023-12-05 ENCOUNTER — Other Ambulatory Visit: Payer: Self-pay | Admitting: Family Medicine

## 2023-12-05 ENCOUNTER — Encounter: Payer: Self-pay | Admitting: Family Medicine

## 2023-12-05 DIAGNOSIS — A09 Infectious gastroenteritis and colitis, unspecified: Secondary | ICD-10-CM | POA: Insufficient documentation

## 2023-12-05 MED ORDER — AZITHROMYCIN 250 MG PO TABS: ORAL_TABLET | ORAL | 0 refills | Status: DC

## 2023-12-06 LAB — CDIFF NAA+O+P+STOOL CULTURE
E coli, Shiga toxin Assay: NEGATIVE
Toxigenic C. Difficile by PCR: NEGATIVE

## 2023-12-08 ENCOUNTER — Encounter: Payer: Self-pay | Admitting: Family Medicine

## 2023-12-08 ENCOUNTER — Other Ambulatory Visit: Payer: Self-pay | Admitting: Family Medicine

## 2023-12-08 DIAGNOSIS — R197 Diarrhea, unspecified: Secondary | ICD-10-CM | POA: Insufficient documentation

## 2023-12-08 NOTE — Telephone Encounter (Signed)
 They requested medical records, they did not deny it.

## 2023-12-09 ENCOUNTER — Encounter: Payer: Self-pay | Admitting: Family Medicine

## 2023-12-10 ENCOUNTER — Other Ambulatory Visit: Payer: Self-pay | Admitting: Family Medicine

## 2023-12-10 DIAGNOSIS — A09 Infectious gastroenteritis and colitis, unspecified: Secondary | ICD-10-CM

## 2023-12-10 MED ORDER — DIPHENOXYLATE-ATROPINE 2.5-0.025 MG PO TABS: 1.0000 | ORAL_TABLET | Freq: Four times a day (QID) | ORAL | 0 refills | Status: DC | PRN

## 2023-12-12 ENCOUNTER — Telehealth: Payer: Self-pay

## 2023-12-12 NOTE — Telephone Encounter (Signed)
 Pharmacy Patient Advocate Encounter   Received notification from CoverMyMeds that prior authorization for Vraylar  1.5MG  capsules is due for renewal.   Insurance verification completed.   The patient is insured through [facility].  Action: Medication has been discontinued. Archived Key: Margot Sheng

## 2023-12-15 ENCOUNTER — Emergency Department (HOSPITAL_BASED_OUTPATIENT_CLINIC_OR_DEPARTMENT_OTHER)

## 2023-12-15 ENCOUNTER — Emergency Department (HOSPITAL_BASED_OUTPATIENT_CLINIC_OR_DEPARTMENT_OTHER)
Admission: EM | Admit: 2023-12-15 | Discharge: 2023-12-15 | Disposition: A | Discharge status: Home / Self Care | Attending: Emergency Medicine | Admitting: Emergency Medicine

## 2023-12-15 ENCOUNTER — Encounter (HOSPITAL_BASED_OUTPATIENT_CLINIC_OR_DEPARTMENT_OTHER): Payer: Self-pay

## 2023-12-15 ENCOUNTER — Other Ambulatory Visit: Payer: Self-pay

## 2023-12-15 DIAGNOSIS — K529 Noninfective gastroenteritis and colitis, unspecified: Secondary | ICD-10-CM | POA: Insufficient documentation

## 2023-12-15 DIAGNOSIS — R1032 Left lower quadrant pain: Secondary | ICD-10-CM | POA: Diagnosis not present

## 2023-12-15 DIAGNOSIS — R935 Abnormal findings on diagnostic imaging of other abdominal regions, including retroperitoneum: Secondary | ICD-10-CM | POA: Diagnosis not present

## 2023-12-15 DIAGNOSIS — K449 Diaphragmatic hernia without obstruction or gangrene: Secondary | ICD-10-CM | POA: Diagnosis not present

## 2023-12-15 DIAGNOSIS — I10 Essential (primary) hypertension: Secondary | ICD-10-CM | POA: Insufficient documentation

## 2023-12-15 DIAGNOSIS — K5790 Diverticulosis of intestine, part unspecified, without perforation or abscess without bleeding: Secondary | ICD-10-CM | POA: Diagnosis not present

## 2023-12-15 DIAGNOSIS — R197 Diarrhea, unspecified: Secondary | ICD-10-CM | POA: Diagnosis not present

## 2023-12-15 LAB — COMPREHENSIVE METABOLIC PANEL WITH GFR
ALT: 17 U/L (ref 0–44)
AST: 21 U/L (ref 15–41)
Albumin: 4.2 g/dL (ref 3.5–5.0)
Alkaline Phosphatase: 72 U/L (ref 38–126)
Anion gap: 13 (ref 5–15)
BUN: 7 mg/dL — ABNORMAL LOW (ref 8–23)
CO2: 26 mmol/L (ref 22–32)
Calcium: 9.6 mg/dL (ref 8.9–10.3)
Chloride: 102 mmol/L (ref 98–111)
Creatinine, Ser: 0.78 mg/dL (ref 0.44–1.00)
GFR, Estimated: >60 mL/min (ref >60)
Glucose, Bld: 106 mg/dL — ABNORMAL HIGH (ref 70–99)
Potassium: 4.2 mmol/L (ref 3.5–5.1)
Sodium: 140 mmol/L (ref 135–145)
Total Bilirubin: <0.2 mg/dL (ref 0.0–1.2)
Total Protein: 7.4 g/dL (ref 6.5–8.1)

## 2023-12-15 LAB — C-REACTIVE PROTEIN: CRP: 1 mg/dL — ABNORMAL HIGH (ref <1.0)

## 2023-12-15 LAB — URINALYSIS, MICROSCOPIC (REFLEX)

## 2023-12-15 LAB — CBC WITH DIFFERENTIAL/PLATELET
Abs Immature Granulocytes: 0.04 10*3/uL (ref 0.00–0.07)
Basophils Absolute: 0 10*3/uL (ref 0.0–0.1)
Basophils Relative: 0 %
Eosinophils Absolute: 0 10*3/uL (ref 0.0–0.5)
Eosinophils Relative: 1 %
HCT: 41.4 % (ref 36.0–46.0)
Hemoglobin: 13.7 g/dL (ref 12.0–15.0)
Immature Granulocytes: 1 %
Lymphocytes Relative: 24 %
Lymphs Abs: 1.4 10*3/uL (ref 0.7–4.0)
MCH: 30.2 pg (ref 26.0–34.0)
MCHC: 33.1 g/dL (ref 30.0–36.0)
MCV: 91.2 fL (ref 80.0–100.0)
Monocytes Absolute: 0.4 10*3/uL (ref 0.1–1.0)
Monocytes Relative: 8 %
Neutro Abs: 3.8 10*3/uL (ref 1.7–7.7)
Neutrophils Relative %: 66 %
Platelets: 348 10*3/uL (ref 150–400)
RBC: 4.54 MIL/uL (ref 3.87–5.11)
RDW: 12.9 % (ref 11.5–15.5)
WBC: 5.7 10*3/uL (ref 4.0–10.5)
nRBC: 0 % (ref 0.0–0.2)

## 2023-12-15 LAB — URINALYSIS, ROUTINE W REFLEX MICROSCOPIC
Glucose, UA: NEGATIVE mg/dL
Hgb urine dipstick: NEGATIVE
Ketones, ur: 15 mg/dL — AB
Leukocytes,Ua: NEGATIVE
Nitrite: NEGATIVE
Protein, ur: 30 mg/dL — AB
Specific Gravity, Urine: >=1.03 (ref 1.005–1.030)
pH: 5.5 (ref 5.0–8.0)

## 2023-12-15 LAB — MAGNESIUM: Magnesium: 2.1 mg/dL (ref 1.7–2.4)

## 2023-12-15 LAB — LIPASE, BLOOD: Lipase: 14 U/L (ref 11–51)

## 2023-12-15 LAB — SEDIMENTATION RATE: Sed Rate: 31 mm/h — ABNORMAL HIGH (ref 0–22)

## 2023-12-15 MED ORDER — PREDNISONE 50 MG PO TABS
50.0000 mg | ORAL_TABLET | Freq: Once | ORAL | Status: AC
  Administered 2023-12-15: 50 mg via ORAL
  Filled 2023-12-15: qty 1

## 2023-12-15 MED ORDER — ONDANSETRON HCL 4 MG PO TABS: 4.0000 mg | ORAL_TABLET | Freq: Three times a day (TID) | ORAL | 0 refills | Status: AC | PRN

## 2023-12-15 MED ORDER — SODIUM CHLORIDE 0.9 % IV BOLUS
1000.0000 mL | Freq: Once | INTRAVENOUS | Status: AC
  Administered 2023-12-15: 1000 mL via INTRAVENOUS

## 2023-12-15 MED ORDER — IOHEXOL 300 MG/ML  SOLN
100.0000 mL | Freq: Once | INTRAMUSCULAR | Status: AC | PRN
  Administered 2023-12-15: 100 mL via INTRAVENOUS

## 2023-12-15 MED ORDER — PREDNISONE 50 MG PO TABS: 50.0000 mg | ORAL_TABLET | Freq: Every day | ORAL | 0 refills | Status: AC

## 2023-12-15 NOTE — Discharge Instructions (Signed)
 You are seen in the emerged department for persistent diarrhea Your CAT scan showed diverticulosis and diffuse colitis There were no other major laboratory abnormalities on your test and urine test was negative Considering your history of ankylosing spondylitis, your ongoing diarrhea may be related to autoimmune inflammation For this reason we gave you dose of steroids here and 4 days of steroids to take at home It is important that you follow-up with your primary care doctor and keep your GI appointment for further testing and evaluation Return to the emergency department for severe pain uncontrolled nausea vomiting, if you are unable to eat or drink or have any other concerns Do your best to keep well-hydrated at home Take Zofran  as directed for nausea vomiting

## 2023-12-15 NOTE — ED Provider Notes (Signed)
 [facility] Provider Note   CSN: [id number] Arrival date & time: 12/15/23  4098     History  Chief Complaint  Patient presents with   Diarrhea    Tracy Landry is a 63 y.o. female.  With a history of status post appendectomy and hysterectomy, GERD, hypertension, hyperlipidemia and obesity presents to ED for diarrhea.  She has experienced ongoing diarrhea and abdominal cramping for the last month.  For this reason she was seen by her PCP at the end of April.  Laboratory workup including stool cultures and C. difficile testing were negative.  She was started on antibiotics (azithromycin ) for empiric treatment of presumed infectious diarrhea.  She has an upcoming GI appointment scheduled for the end of May.  She presents to the ED today with ongoing diarrhea, feeling dehydrated, weakness and nausea.  No measured fevers at home.  No bloody bowel movements or hematemesis.   Diarrhea      Home Medications Prior to Admission medications   Medication Sig Start Date End Date Taking? Authorizing Provider  ondansetron  (ZOFRAN ) 4 MG tablet Take 1 tablet (4 mg total) by mouth every 8 (eight) hours as needed for up to 5 days for nausea or vomiting. 12/15/23 12/20/23 Yes Sallyanne Creamer, DO  predniSONE  (DELTASONE ) 50 MG tablet Take 1 tablet (50 mg total) by mouth daily with breakfast for 4 days. 12/15/23 12/19/23 Yes Sallyanne Creamer, DO  albuterol  (VENTOLIN  HFA) 108 (90 Base) MCG/ACT inhaler Inhale 2 puffs into the lungs every 6 (six) hours as needed. 07/01/23   Mickiel Albany, FNP  azithromycin  (ZITHROMAX ) 250 MG tablet Take 2 by mouth on day one, then one by mouth daily for 4 days 12/05/23   Mickiel Albany, FNP  cyclobenzaprine  (FLEXERIL ) 10 MG tablet TAKE 1 TABLET BY MOUTH AT BEDTIME 07/18/23   Breeback, Jade L, PA-C  diclofenac  (VOLTAREN ) 75 MG EC tablet Take 1 tablet by mouth twice daily 10/01/23   Breeback, Jade L, PA-C  diphenoxylate -atropine   (LOMOTIL ) 2.5-0.025 MG tablet Take 1 tablet by mouth 4 (four) times daily as needed for diarrhea or loose stools. 12/10/23   Mickiel Albany, FNP  esomeprazole (NEXIUM) 20 MG capsule Take by mouth.    [provider]  gabapentin  (NEURONTIN ) 300 MG capsule TAKE 1 CAPSULE BY MOUTH THREE TIMES DAILY 10/13/23   Breeback, Jade L, PA-C  montelukast  (SINGULAIR ) 10 MG tablet TAKE 1 TABLET BY MOUTH AT BEDTIME 10/13/23   Breeback, Jade L, PA-C  NONFORMULARY OR COMPOUNDED ITEM [employer]  #14. Scar Cream  60mg     [provider]  pravastatin  (PRAVACHOL ) 20 MG tablet Take 1 tablet by mouth once daily 10/13/23   Breeback, Jade L, PA-C  promethazine  (PHENERGAN ) 12.5 MG tablet Take 1 tablet (12.5 mg total) by mouth every 6 (six) hours as needed for nausea or vomiting. 03/14/22   Micheline Ahr, MD  traMADol  (ULTRAM ) 50 MG tablet Take 1 tablet (50 mg total) by mouth every 6 (six) hours as needed (pain). 09/18/23   Ann Keto, MD  traZODone  (DESYREL ) 50 MG tablet TAKE 1/2 TO 1 (ONE-HALF TO ONE) TABLET BY MOUTH AT BEDTIME AS NEEDED FOR SLEEP 04/29/22   Breeback, Jade L, PA-C  triamcinolone  cream (KENALOG ) 0.1 % APPLY  CREAM EXTERNALLY TWICE DAILY TO  LOWER  LEG  RASH 12/31/22   Breeback, Jade L, PA-C  venlafaxine  XR (EFFEXOR -XR) 150 MG 24 hr capsule TAKE 1 CAPSULE BY MOUTH  ONCE DAILY WITH BREAKFAST 03/17/23   Breeback, Jade L, PA-C      Allergies    Chlorhexidine  gluconate, Amoxicillin -pot clavulanate, and Codeine sulfate    Review of Systems   Review of Systems  Gastrointestinal:  Positive for diarrhea.    Physical Exam Updated Vital Signs BP (!) 105/55   Pulse (!) 58   Temp 98.2 F (36.8 C)   Resp 13   Ht 5\' 4"  (1.626 m)   Wt 89.8 kg   LMP 10/28/2006   SpO2 98%   BMI 33.99 kg/m  Physical Exam Vitals and nursing note reviewed.  HENT:     Head: Normocephalic and atraumatic.  Eyes:     Pupils: Pupils are equal, round, and reactive to light.  Cardiovascular:     Rate and  Rhythm: Normal rate and regular rhythm.  Pulmonary:     Effort: Pulmonary effort is normal.     Breath sounds: Normal breath sounds.  Abdominal:     Palpations: Abdomen is soft.     Tenderness: There is abdominal tenderness. There is no guarding or rebound.     Comments: Left lower quadrant tenderness  Skin:    General: Skin is warm and dry.  Neurological:     Mental Status: She is alert.  Psychiatric:        Mood and Affect: Mood normal.     ED Results / Procedures / Treatments   Labs (all labs ordered are listed, but only abnormal results are displayed) Labs Reviewed  COMPREHENSIVE METABOLIC PANEL WITH GFR - Abnormal; Notable for the following components:      Result Value   Glucose, Bld 106 (*)    BUN 7 (*)    All other components within normal limits  URINALYSIS, ROUTINE W REFLEX MICROSCOPIC - Abnormal; Notable for the following components:   APPearance HAZY (*)    Bilirubin Urine SMALL (*)    Ketones, ur 15 (*)    Protein, ur 30 (*)    All other components within normal limits  URINALYSIS, MICROSCOPIC (REFLEX) - Abnormal; Notable for the following components:   Bacteria, UA MANY (*)    All other components within normal limits  MAGNESIUM  LIPASE, BLOOD  CBC WITH DIFFERENTIAL/PLATELET  SEDIMENTATION RATE  C-REACTIVE PROTEIN    EKG None  Radiology CT ABDOMEN PELVIS W CONTRAST Result Date: 12/15/2023 CLINICAL DATA:  Left lower quadrant abdominal pain EXAM: CT ABDOMEN AND PELVIS WITH CONTRAST TECHNIQUE: Multidetector CT imaging of the abdomen and pelvis was performed using the standard protocol following bolus administration of intravenous contrast. RADIATION DOSE REDUCTION: This exam was performed according to the departmental dose-optimization program which includes automated exposure control, adjustment of the mA and/or kV according to patient size and/or use of iterative reconstruction technique. CONTRAST:  OMNIPAQUE IOHEXOL 300 MG/ML  SOLN COMPARISON:   July 10, 2009 CT abdomen and pelvis FINDINGS: Lower chest: Small hiatal hernia. No acute infiltrates. No pleural effusions Hepatobiliary: No focal liver abnormality is seen. No gallstones, gallbladder wall thickening, or biliary dilatation. Pancreas: Unremarkable. No pancreatic ductal dilatation or surrounding inflammatory changes. Spleen: Normal in size without focal abnormality. Adrenals/Urinary Tract: Adrenal glands are unremarkable. Kidneys are normal, without renal calculi, focal lesion, or hydronephrosis. Bladder is unremarkable. Stomach/Bowel: Diffuse mucosal thickening involving the entire colon which could correlate with colitis. Inflammatory, infectious or pseudomembranous. Mild diffuse diverticulosis descending and sigmoid colon without diverticulitis. Small to medium-sized hiatal hernia Vascular/Lymphatic: No significant vascular findings are present. No enlarged abdominal or pelvic  lymph nodes. Reproductive: Status post hysterectomy. No adnexal masses. Other: No abdominal wall hernia or abnormality. No abdominopelvic ascites. Musculoskeletal: No fracture is seen. IMPRESSION: *Diffuse mucosal thickening involving the entire colon which could correlate with colitis. Inflammatory, infectious or pseudomembranous. *Mild diffuse diverticulosis descending and sigmoid colon without diverticulitis. *Small to medium-sized hiatal hernia. Electronically Signed   By: Fredrich Jefferson M.D.   On: 12/15/2023 13:19    Procedures Procedures    Medications Ordered in ED Medications  predniSONE  (DELTASONE ) tablet 50 mg (has no administration in time range)  sodium chloride  0.9 % bolus 1,000 mL (0 mLs Intravenous Stopped 12/15/23 1146)  iohexol (OMNIPAQUE) 300 MG/ML solution 100 mL (100 mLs Intravenous Contrast Given 12/15/23 1209)    ED Course/ Medical Decision Making/ A&P Clinical Course as of 12/15/23 1352  Mon Dec 15, 2023  1346 No UTI.  No leukocytosis.  No significant renal impairment or electrolyte  derangement. CT abdomen pelvis notable for diffuse colitis.  Low suspicion for infectious colitis given the lack of white count.  May be inflammatory as patient now mentions she does have a history of ankylosing spondylitis.  Will give her short course of steroids along with Zofran  prescription to pick up from her pharmacy and she will follow-up with her PCP.  Will add on ESR CRP for inflammatory markers for her future GI and PCP to follow-up with [MP]    Clinical Course User Index [MP] Sallyanne Creamer, DO                                 Medical Decision Making 63 year old female with history as above presents to the ED for ongoing diarrhea beginning last month.  Was seen in PCP office at the end of April for this stool culture O&P C. difficile was negative.  She completed a course of azithromycin  given for empiric treatment of suspected infectious diarrhea with no relief.  Antidiarrheals have also provided no relief.  Feels as though she is dehydrated.  Afebrile normotensive on my exam.  Dry oral mucosa.  Exam notable for some left lower quadrant tenderness.  Differential diagnosis includes: Acute intra-abdominal infectious/inflammatory process such as, diverticulitis, pancreatitis and cholecystitis Urinary tract infection Viral gastroenteritis Inflammatory bowel disease IBS  Will obtain laboratory workup including CBC with differential, metabolic panel, lipase and urinalysis along with CT abdomen pelvis Will provide IV fluids for rehydration and Zofran  for relief of nausea  Amount and/or Complexity of Data Reviewed Labs: ordered. Radiology: ordered.  Risk Prescription drug management.           Final Clinical Impression(s) / ED Diagnoses Final diagnoses:  Colitis    Rx / DC Orders ED Discharge Orders          Ordered    predniSONE  (DELTASONE ) 50 MG tablet  Daily with breakfast        12/15/23 1352    ondansetron  (ZOFRAN ) 4 MG tablet  Every 8 hours PRN         12/15/23 1352              Sallyanne Creamer, DO 12/15/23 1353

## 2023-12-15 NOTE — ED Triage Notes (Signed)
 Pt arrives ambulatory to ED with reports of diarrhea and cramping since April states that she has been seen by PCP for same and was put on antibiotics. Reports decreased PO intake and abdominal cramping. Has upcoming appointment with GI but is not able to get in with them until the end of the month. Denies any blood in stool.

## 2023-12-21 ENCOUNTER — Other Ambulatory Visit: Payer: Self-pay | Admitting: Physician Assistant

## 2023-12-22 ENCOUNTER — Ambulatory Visit: Admitting: Family Medicine

## 2023-12-22 ENCOUNTER — Encounter: Payer: Self-pay | Admitting: Family Medicine

## 2023-12-22 VITALS — BP 124/83 | HR 74 | Temp 98.7°F | Ht 64.0 in | Wt 199.4 lb

## 2023-12-22 DIAGNOSIS — M459 Ankylosing spondylitis of unspecified sites in spine: Secondary | ICD-10-CM

## 2023-12-22 DIAGNOSIS — K529 Noninfective gastroenteritis and colitis, unspecified: Secondary | ICD-10-CM | POA: Insufficient documentation

## 2023-12-22 DIAGNOSIS — F411 Generalized anxiety disorder: Secondary | ICD-10-CM | POA: Diagnosis not present

## 2023-12-22 MED ORDER — CYCLOBENZAPRINE HCL 10 MG PO TABS: 10.0000 mg | ORAL_TABLET | Freq: Every day | ORAL | 0 refills | Status: DC

## 2023-12-22 NOTE — Assessment & Plan Note (Signed)
 Taking Effexor  150 mg daily as prescribed. The past 2 weeks have been stressful with current health issue. GAD-7 score today: 15 Offered referral to counseling, will find one on her own. Continue to monitor.

## 2023-12-22 NOTE — Assessment & Plan Note (Signed)
 Went to ED for ongoing diarrhea despite treatment with antibiotics.  CT scan showed colitis and diverticulosis.  Prescribed prednisone  50 mg daily for 5 days. Symptoms have improved. Has had one semi-solid BM yesterday. Has appointment with GI on 12/31/23 for evaluation. Follow-up with PCP sooner if anything changes.

## 2023-12-22 NOTE — Assessment & Plan Note (Signed)
 Refill to manage symptoms for chronic condition.  Continue to monitor.

## 2023-12-22 NOTE — Progress Notes (Signed)
 Established Patient Office Visit  Subjective   Patient ID: Tracy Landry, female    DOB: Mar 27, 1961  Age: 63 y.o. MRN: [id number]  Chief Complaint  Patient presents with   Hospitalization Follow-up    Stomach pains-pt has appt. With gastrologist 12/31/23    Follow-for diarrhea and stomach rumbling. Went to ED and was diagnosed with colitis. Treated with  prednisone  and Zofran . Bowel movements have improved, semi solid yesterday. One BM today. Low appetite.  Has GI appointment on 12/31/23.  GAD: on Effexor  Not seeing counselor currently, will try to get one soon.           12/15/23: ED for diarrhea. CT scan:  Diffuse mucosal thickening involving the entire colon Colitis  Diverticulosis, no diverticulitis Small to medium sized hiatal hernia.   Prednisone  50 mg daily and Zofran  4 mg Q 8 hours   Review of Systems  Gastrointestinal:  Negative for abdominal pain, blood in stool, constipation, diarrhea, nausea and vomiting.     Objective:     BP 124/83 (BP Location: Left Arm, Patient Position: Sitting, Cuff Size: Large)   Pulse 74   Temp 98.7 F (37.1 C) (Oral)   Ht 5\' 4"  (1.626 m)   Wt 199 lb 7 oz (90.5 kg)   LMP 10/28/2006   SpO2 97%   BMI 34.23 kg/m  BP Readings from Last 3 Encounters:  12/22/23 124/83  12/15/23 117/63  12/01/23 130/85      Physical Exam Vitals and nursing note reviewed.  Constitutional:      General: She is not in acute distress.    Appearance: Normal appearance.  Cardiovascular:     Rate and Rhythm: Regular rhythm.     Heart sounds: Normal heart sounds.  Pulmonary:     Effort: Pulmonary effort is normal.     Breath sounds: Normal breath sounds.  Abdominal:     General: Bowel sounds are normal.     Palpations: Abdomen is soft.     Tenderness: There is no abdominal tenderness.  Skin:    General: Skin is warm and dry.  Neurological:     General: No focal deficit present.     Mental Status: She is alert. Mental status is at  baseline.  Psychiatric:        Mood and Affect: Mood normal.        Behavior: Behavior normal.        Thought Content: Thought content normal.        Judgment: Judgment normal.       12/22/2023    5:10 PM 12/02/2022   11:54 AM 03/11/2022   11:33 AM 04/10/2021    1:19 PM  GAD 7 : Generalized Anxiety Score  Nervous, Anxious, on Edge 3 2 1 1   Control/stop worrying 2 1 1 1   Worry too much - different things 3 1 1 1   Trouble relaxing 3 1 1 1   Restless 2 1 1  0  Easily annoyed or irritable 2 1 1  0  Afraid - awful might happen 0 0 0 0  Total GAD 7 Score 15 7 6 4   Anxiety Difficulty Somewhat difficult Not difficult at all Somewhat difficult Not difficult at all      No results found for any visits on 12/22/23.  Last CBC Lab Results  Component Value Date   WBC 5.7 12/15/2023   HGB 13.7 12/15/2023   HCT 41.4 12/15/2023   MCV 91.2 12/15/2023   MCH 30.2 12/15/2023   RDW 12.9  12/15/2023   PLT 348 12/15/2023      The 10-year ASCVD risk score (Arnett DK, et al., 2019) is: 3.5%    Assessment & Plan:   Problem List Items Addressed This Visit     ANKYLOSING SPONDYLITIS - Primary   Refill to manage symptoms for chronic condition.  Continue to monitor.       Relevant Medications   cyclobenzaprine  (FLEXERIL ) 10 MG tablet   Colitis   Went to ED for ongoing diarrhea despite treatment with antibiotics.  CT scan showed colitis and diverticulosis.  Prescribed prednisone  50 mg daily for 5 days. Symptoms have improved. Has had one semi-solid BM yesterday. Has appointment with GI on 12/31/23 for evaluation. Follow-up with PCP sooner if anything changes.        GAD (generalized anxiety disorder)   Taking Effexor  150 mg daily as prescribed. The past 2 weeks have been stressful with current health issue. GAD-7 score today: 15 Offered referral to counseling, will find one on her own. Continue to monitor.      Agrees with plan of care discussed.  Questions answered.   Return if  symptoms worsen or fail to improve.    Mickiel Albany, FNP

## 2023-12-30 ENCOUNTER — Other Ambulatory Visit: Payer: Self-pay | Admitting: Physician Assistant

## 2023-12-30 DIAGNOSIS — G894 Chronic pain syndrome: Secondary | ICD-10-CM

## 2023-12-30 DIAGNOSIS — F331 Major depressive disorder, recurrent, moderate: Secondary | ICD-10-CM

## 2023-12-31 ENCOUNTER — Ambulatory Visit: Admitting: Gastroenterology

## 2023-12-31 ENCOUNTER — Other Ambulatory Visit (INDEPENDENT_AMBULATORY_CARE_PROVIDER_SITE_OTHER)

## 2023-12-31 ENCOUNTER — Encounter: Payer: Self-pay | Admitting: Gastroenterology

## 2023-12-31 VITALS — BP 140/80 | HR 72 | Ht 64.0 in | Wt 198.0 lb

## 2023-12-31 DIAGNOSIS — R197 Diarrhea, unspecified: Secondary | ICD-10-CM

## 2023-12-31 DIAGNOSIS — R634 Abnormal weight loss: Secondary | ICD-10-CM

## 2023-12-31 DIAGNOSIS — R935 Abnormal findings on diagnostic imaging of other abdominal regions, including retroperitoneum: Secondary | ICD-10-CM | POA: Diagnosis not present

## 2023-12-31 DIAGNOSIS — R630 Anorexia: Secondary | ICD-10-CM

## 2023-12-31 DIAGNOSIS — R194 Change in bowel habit: Secondary | ICD-10-CM | POA: Diagnosis not present

## 2023-12-31 LAB — SEDIMENTATION RATE: Sed Rate: 19 mm/h (ref 0–30)

## 2023-12-31 LAB — CBC WITH DIFFERENTIAL/PLATELET
Basophils Absolute: 0 10*3/uL (ref 0.0–0.1)
Basophils Relative: 0.6 % (ref 0.0–3.0)
Eosinophils Absolute: 0.1 10*3/uL (ref 0.0–0.7)
Eosinophils Relative: 1.4 % (ref 0.0–5.0)
HCT: 42.2 % (ref 36.0–46.0)
Hemoglobin: 13.8 g/dL (ref 12.0–15.0)
Lymphocytes Relative: 31.2 % (ref 12.0–46.0)
Lymphs Abs: 2 10*3/uL (ref 0.7–4.0)
MCHC: 32.8 g/dL (ref 30.0–36.0)
MCV: 91.7 fl (ref 78.0–100.0)
Monocytes Absolute: 0.5 10*3/uL (ref 0.1–1.0)
Monocytes Relative: 7.7 % (ref 3.0–12.0)
Neutro Abs: 3.8 10*3/uL (ref 1.4–7.7)
Neutrophils Relative %: 59.1 % (ref 43.0–77.0)
Platelets: 305 10*3/uL (ref 150.0–400.0)
RBC: 4.6 Mil/uL (ref 3.87–5.11)
RDW: 13.9 % (ref 11.5–15.5)
WBC: 6.5 10*3/uL (ref 4.0–10.5)

## 2023-12-31 LAB — COMPREHENSIVE METABOLIC PANEL WITH GFR
ALT: 16 U/L (ref 0–35)
AST: 18 U/L (ref 0–37)
Albumin: 4.3 g/dL (ref 3.5–5.2)
Alkaline Phosphatase: 56 U/L (ref 39–117)
BUN: 11 mg/dL (ref 6–23)
CO2: 28 meq/L (ref 19–32)
Calcium: 9.3 mg/dL (ref 8.4–10.5)
Chloride: 103 meq/L (ref 96–112)
Creatinine, Ser: 0.77 mg/dL (ref 0.40–1.20)
GFR: 83.23 mL/min (ref >60.00)
Glucose, Bld: 91 mg/dL (ref 70–99)
Potassium: 4 meq/L (ref 3.5–5.1)
Sodium: 139 meq/L (ref 135–145)
Total Bilirubin: 0.3 mg/dL (ref 0.2–1.2)
Total Protein: 7.2 g/dL (ref 6.0–8.3)

## 2023-12-31 LAB — HIGH SENSITIVITY CRP: CRP, High Sensitivity: 3.01 mg/L (ref 0.000–5.000)

## 2023-12-31 MED ORDER — NA SULFATE-K SULFATE-MG SULF 17.5-3.13-1.6 GM/177ML PO SOLN
1.0000 | Freq: Once | ORAL | 0 refills | Status: AC
Start: 2023-12-31 — End: 2023-12-31

## 2023-12-31 NOTE — Patient Instructions (Signed)
 Your provider has requested that you go to the basement level for lab work before leaving today. Press "B" on the elevator. The lab is located at the first door on the left as you exit the elevator.  You have been scheduled for a colonoscopy. Please follow written instructions given to you at your visit today.   If you use inhalers (even only as needed), please bring them with you on the day of your procedure.  DO NOT TAKE 7 DAYS PRIOR TO TEST- Trulicity (dulaglutide) Ozempic, Wegovy  (semaglutide ) Mounjaro (tirzepatide) Bydureon Bcise (exanatide extended release)  DO NOT TAKE 1 DAY PRIOR TO YOUR TEST Rybelsus (semaglutide ) Adlyxin (lixisenatide) Victoza (liraglutide) Byetta (exanatide) ___________________________________________________________________________  _______________________________________________________  If your blood pressure at your visit was 140/90 or greater, please contact your primary care physician to follow up on this.  _______________________________________________________  If you are age 62 or older, your body mass index should be between 23-30. Your Body mass index is 33.99 kg/m. If this is out of the aforementioned range listed, please consider follow up with your Primary Care Provider.  If you are age 3 or younger, your body mass index should be between 19-25. Your Body mass index is 33.99 kg/m. If this is out of the aformentioned range listed, please consider follow up with your Primary Care Provider.   ________________________________________________________  The [facility] providers would like to encourage you to use MYCHART to communicate with providers for non-urgent requests or questions.  Due to long hold times on the telephone, sending your provider a message by [facility] may be a faster and more efficient way to get a response.  Please allow 48 business hours for a response.  Please remember that this is for non-urgent requests.   _______________________________________________________

## 2023-12-31 NOTE — Progress Notes (Addendum)
 Tracy Landry [id number] 17-Mar-1961   Chief Complaint: Loose stools, early satiety  Referring Provider: Mickiel Albany, FNP Primary GI MD: Para Bold (previous Dr. Tova Fresh)  HPI: Tracy Landry is a 63 y.o. female with past medical history of GERD, asthma, HTN, SVT s/p ablation 2014, prior appendectomy, total abdominal hysterectomy 2009, ankylosing spondylitis who presents today for a complaint of early satiety and loose stools.    Patient seen at [facility] ED 12/15/2023 with complaint of diarrhea and abdominal cramping ongoing for 1 month.  Had been seen by PCP at the end of April, had laboratory workup including stool cultures and C. difficile which were negative, and she was started on azithromycin  for empiric treatment of presumed infectious diarrhea.  Workup in the ED included CT A/P which was notable for diffuse colitis, with low suspicion for infectious colitis given the lack of white count.  Possible inflammatory colitis as patient does have history of ankylosing spondylitis.  She was given a course of steroids along with Zofran , inflammatory markers added on to workup.  Labs 12/15/2023: BUN 7, otherwise normal CMP.  Normal magnesium, normal lipase, normal CBC, negative UTI.  Elevated sed rate at 31, elevated CRP at 1.0.  Had follow up with PCP 5/19, symptoms at that time had improved after prednisone  50mg  daily for 5 days.   Patient here today with her wife Tracy Landry.  States that back in April she started noticing frequent, thin bowel movements which progressed to explosive diarrhea.  She was having 4-5 bowel movements daily, stools were watery.  She would have a day or two in between bouts of diarrhea where she did not have any bowel movements.  Reports that she was trying to stay hydrated and drinking lots of fluids, eating bland foods, but did lose about a pound a day for 3 to 4 weeks while this was going on.  Estimates about 25 to 30 pounds weight loss within the last  couple of months.   In addition to diarrhea, patient reports having an intermittent low-grade fever and some nausea.  Also had generalized abdominal discomfort which is better now but not totally gone.  Nausea has improved.  She denies any vomiting.  Denies melena or blood in stool.  She completed a 5-day course of prednisone  and began to notice improvement around day 4.  A few days ago she did have her first solid stool.  She is feeling better but not completely back to normal.  She continues to avoid any fried or rich/greasy foods.  States her weight is stable, no longer losing but also not gaining weight.  Prior to onset of diarrhea, patient has always had regular bowel movements.  States she would have a formed bowel movement daily.  States she was diagnosed with ankylosing spondylitis in the early 2000's.  Will occasionally have flareups involving pain in her joints.  Patient denies any breakthrough acid reflux or heartburn on Nexium.  This has been controlled for years.  She denies any prior upper endoscopy.  Denies dysphagia.  Patient denies family history of colon cancer or stomach cancer.  Denies family history of IBD.  Patient denies heart problems.  She may have a flareup of asthma if she has an upper respiratory infection, uses inhalers for this.  Denies home oxygen use.  Denies chest pain or shortness of breath.   Previous GI Procedures/Imaging   CT A/P 12/15/2023 - Diffuse mucosal thickening involving the entire colon which could correlate with colitis.  Inflammatory, infectious or pseudomembranous. - Mild diffuse diverticulosis descending and sigmoid colon without diverticulitis. - Small to medium-sized hiatal hernia.  Cologuard 03/13/2022 - Negative  Colonoscopy 05/08/2012 (Dr. 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   Wears glasses     Past Surgical History:  Procedure Laterality Date   ABLATION OF DYSRHYTHMIC FOCUS  10/07/2012   SVT   APPENDECTOMY     FOOT SURGERY  2000   rt foot fx   HARDWARE REMOVAL  2003   rt arm   KNEE ARTHROSCOPY WITH MEDIAL MENISECTOMY Right 03/14/2022   Procedure: KNEE ARTHROSCOPY WITH MEDIAL MENISCAL ROOT REPAIR;  Surgeon: Micheline Ahr, MD;  Location: [facility];  Service: Orthopedics;  Laterality: Right;   MASS EXCISION Left 07/07/2013   Procedure: EXCISION MASS LEFT RING FINGER;  Surgeon: Kemp Patter, MD;  Location: [facility];  Service: Orthopedics;  Laterality: Left;   PATELLECTOMY     REPAIR EXTENSOR TENDON Left 07/07/2013   Procedure: REPAIR EXTENSOR TENDON LEFT WITH DEBRIDMENT PROXIMAL INTERPHALANGEAL JOINT;  Surgeon: Kemp Patter, MD;  Location: [facility];  Service: Orthopedics;  Laterality: Left;   right arm reconstruction  08/1999   SUPRAVENTRICULAR TACHYCARDIA ABLATION N/A 10/07/2012   Procedure: SUPRAVENTRICULAR TACHYCARDIA ABLATION;  Surgeon: Tammie Fall, MD;  Location: [facility] C[facility];  Service: Cardiovascular;  Laterality: N/A;   TOTAL ABDOMINAL HYSTERECTOMY  10/2007   TOTAL KNEE ARTHROPLASTY      Current Outpatient Medications  Medication Sig Dispense Refill   albuterol  (VENTOLIN  HFA) 108 (90 Base) MCG/ACT inhaler Inhale 2 puffs into the lungs every 6 (six) hours as needed. 18 g 0   azithromycin  (ZITHROMAX ) 250 MG tablet Take 2 by mouth on day one, then one by mouth daily for 4 days 6 tablet 0   cyclobenzaprine  (FLEXERIL ) 10 MG tablet Take 1 tablet (10 mg total) by mouth at bedtime. 90 tablet 0   diclofenac  (VOLTAREN ) 75 MG EC tablet Take 1 tablet by mouth twice daily 180 tablet 0   diphenoxylate -atropine  (LOMOTIL ) 2.5-0.025 MG tablet  Take 1 tablet by mouth 4 (four) times daily as needed for diarrhea or loose stools. 30 tablet 0   esomeprazole (NEXIUM) 20 MG capsule Take by mouth.     gabapentin  (NEURONTIN ) 300 MG capsule TAKE 1 CAPSULE BY MOUTH THREE TIMES DAILY 90 capsule 0   montelukast  (SINGULAIR ) 10 MG tablet TAKE 1 TABLET BY MOUTH AT BEDTIME 90 tablet 0   NONFORMULARY OR COMPOUNDED ITEM [facility]  #14. Scar Cream  60mg      pravastatin  (PRAVACHOL ) 20 MG tablet Take 1 tablet by mouth once daily 90 tablet 0   promethazine  (PHENERGAN ) 12.5 MG tablet Take 1 tablet (12.5 mg total) by mouth every 6 (six) hours as needed for nausea or vomiting. 15 tablet 0   traMADol  (ULTRAM ) 50 MG tablet Take 1 tablet (50 mg total) by mouth every 6 (six) hours as needed (pain). 12 tablet 0   traZODone  (DESYREL ) 50 MG tablet TAKE 1/2 TO 1 (ONE-HALF TO ONE) TABLET BY MOUTH AT BEDTIME AS NEEDED FOR SLEEP 90 tablet 0  triamcinolone  cream (KENALOG ) 0.1 % APPLY  CREAM EXTERNALLY TWICE DAILY TO  LOWER  LEG  RASH 80 g 0   venlafaxine  XR (EFFEXOR -XR) 150 MG 24 hr capsule TAKE 1 CAPSULE BY MOUTH ONCE DAILY WITH BREAKFAST 90 capsule 1   No current facility-administered medications for this visit.    Allergies as of 12/31/2023 - Review Complete 12/22/2023  Allergen Reaction Noted   Chlorhexidine  gluconate Itching 09/14/2017   Amoxicillin -pot clavulanate Diarrhea 09/22/2017   Codeine sulfate Itching     Family History  Problem Relation Age of Onset   Alcohol abuse Mother    Diabetes Mother    Hyperlipidemia Mother    Hypertension Mother    Heart disease Mother        Rheumatic disease   Alzheimer's disease Father    Diabetes Sister    Hyperlipidemia Sister    Hypertension Sister    Heart attack Maternal Uncle    Lung cancer Maternal Grandmother        lung   Heart attack Maternal Grandfather     Social History   Tobacco Use   Smoking status: Never   Smokeless tobacco: Never  Vaping Use   Vaping status: Former   Substance Use Topics   Alcohol use: Yes    Comment: 3 a week   Drug use: No     Review of Systems:    Constitutional: Positive for weight loss, low grade fever. No chills, weakness or fatigue Eyes: No change in vision Ears, Nose, Throat:  No change in hearing or congestion Skin: No rash or itching Cardiovascular: No chest pain, chest pressure or palpitations   Respiratory: No SOB or cough Gastrointestinal: See HPI and otherwise negative Genitourinary: No dysuria or change in urinary frequency Neurological: No headache, dizziness or syncope Musculoskeletal: No new muscle or joint pain Hematologic: No bleeding or bruising    Physical Exam:  Vital signs: BP (!) 140/80   Pulse 72   Ht 5\' 4"  (1.626 m)   Wt 198 lb (89.8 kg)   LMP 10/28/2006   BMI 33.99 kg/m    Constitutional: NAD, Well developed, Well nourished, alert and cooperative Head:  Normocephalic and atraumatic.  Eyes: No scleral icterus. Conjunctiva pink. Mouth: No oral lesions. Respiratory: Respirations even and unlabored. Lungs clear to auscultation bilaterally.  No wheezes, crackles, or rhonchi.  Cardiovascular:  Regular rate and rhythm. No murmurs. No peripheral edema. Gastrointestinal:  Soft, nondistended, nontender. No rebound or guarding. Normal bowel sounds. No appreciable masses or hepatomegaly. Rectal:  Not performed.  Neurologic:  Alert and oriented x4;  grossly normal neurologically.  Skin:   Dry and intact without significant lesions or rashes. Psychiatric: Oriented to person, place and time. Demonstrates good judgement and reason without abnormal affect or behaviors.   RELEVANT LABS AND IMAGING: CBC    Component Value Date/Time   WBC 5.7 12/15/2023 1028   RBC 4.54 12/15/2023 1028   HGB 13.7 12/15/2023 1028   HGB 14.3 12/01/2023 1125   HGB 13.2 06/23/2014 1539   HCT 41.4 12/15/2023 1028   HCT 43.3 12/01/2023 1125   PLT 348 12/15/2023 1028   PLT 337 12/01/2023 1125   MCV 91.2 12/15/2023 1028    MCV 93 12/01/2023 1125   MCH 30.2 12/15/2023 1028   MCHC 33.1 12/15/2023 1028   RDW 12.9 12/15/2023 1028   RDW 13.0 12/01/2023 1125   LYMPHSABS 1.4 12/15/2023 1028   MONOABS 0.4 12/15/2023 1028   EOSABS 0.0 12/15/2023 1028   BASOSABS 0.0  12/15/2023 1028    CMP     Component Value Date/Time   NA 140 12/15/2023 1028   NA 141 12/01/2023 1125   K 4.2 12/15/2023 1028   CL 102 12/15/2023 1028   CO2 26 12/15/2023 1028   GLUCOSE 106 (H) 12/15/2023 1028   BUN 7 (L) 12/15/2023 1028   BUN 9 12/01/2023 1125   CREATININE 0.78 12/15/2023 1028   CREATININE 0.82 03/11/2022 1129   CALCIUM 9.6 12/15/2023 1028   PROT 7.4 12/15/2023 1028   PROT 7.4 12/01/2023 1125   ALBUMIN 4.2 12/15/2023 1028   ALBUMIN 4.3 12/01/2023 1125   AST 21 12/15/2023 1028   ALT 17 12/15/2023 1028   ALKPHOS 72 12/15/2023 1028   BILITOT <0.2 12/15/2023 1028   BILITOT <0.2 12/01/2023 1125   GFRNONAA >60 12/15/2023 1028   GFRAA >90 06/30/2013 1255     Assessment/Plan:   Change in bowel habits Diarrhea Weight loss Decreased appetite Abnormal CT of the abdomen Patient with onset of frequent loose stools beginning in April.  Workup negative for infection.  Treated with empiric course of azithromycin  which did not improve her symptoms.  Seen in the ED and found to have diffuse colonic inflammation on CT A/P along with elevated inflammatory markers, treated with 5-day course of prednisone  with improvement.  Feeling better now but not completely back to normal.  Had some weight loss during this time.  She does have history of ankylosing spondylitis and possibility of inflammatory colitis is in question.  Last colonoscopy 2013.  - Schedule colonoscopy for further workup. I thoroughly discussed the procedure with the patient to include nature of the procedure, alternatives, benefits, and risks (including but not limited to bleeding, infection, perforation, anesthesia/cardiac/pulmonary complications). Patient verbalized  understanding and gave verbal consent to proceed with procedure.  - Repeat labs today: CBC, CMP, ESR, CRP - Patient prefers to hold off on fecal calprotectin at this time.   Valiant Gaul, PA-C [facility] 12/31/2023, 9:21 AM  Patient Care Team: Mickiel Albany, FNP as PCP - General (Family Medicine)    Agree w/ Ms. Kandas Oliveto's evaluation and plans. Labs were normal  Kenney Peacemaker, MD, [facility]

## 2024-01-01 ENCOUNTER — Ambulatory Visit: Payer: Self-pay | Admitting: Gastroenterology

## 2024-01-01 ENCOUNTER — Encounter: Payer: Self-pay | Admitting: Internal Medicine

## 2024-01-05 ENCOUNTER — Ambulatory Visit (AMBULATORY_SURGERY_CENTER): Admitting: Internal Medicine

## 2024-01-05 ENCOUNTER — Encounter: Payer: Self-pay | Admitting: Internal Medicine

## 2024-01-05 VITALS — BP 101/53 | HR 91 | Temp 97.7°F | Resp 14 | Ht 64.0 in | Wt 198.0 lb

## 2024-01-05 DIAGNOSIS — R933 Abnormal findings on diagnostic imaging of other parts of digestive tract: Secondary | ICD-10-CM

## 2024-01-05 DIAGNOSIS — K5730 Diverticulosis of large intestine without perforation or abscess without bleeding: Secondary | ICD-10-CM

## 2024-01-05 DIAGNOSIS — R194 Change in bowel habit: Secondary | ICD-10-CM | POA: Diagnosis not present

## 2024-01-05 DIAGNOSIS — K529 Noninfective gastroenteritis and colitis, unspecified: Secondary | ICD-10-CM | POA: Diagnosis not present

## 2024-01-05 DIAGNOSIS — K648 Other hemorrhoids: Secondary | ICD-10-CM

## 2024-01-05 MED ORDER — SODIUM CHLORIDE 0.9 % IV SOLN: 500.0000 mL | Freq: Once | INTRAVENOUS | Status: DC

## 2024-01-05 NOTE — Progress Notes (Signed)
 Report to PACU, RN, vss, BBS= Clear.

## 2024-01-05 NOTE — Progress Notes (Signed)
 History and Physical Interval Note:  01/05/2024 12:06 PM  Tracy Landry  has presented today for endoscopic procedure(s), with the diagnosis of  Encounter Diagnoses  Name Primary?   Change in bowel habits Yes   Abnormal CT scan, colon    Colitis   .  The various methods of evaluation and treatment have been discussed with the patient and/or family. After consideration of risks, benefits and other options for treatment, the patient has consented to  the endoscopic procedure(s).   The patient's history has been reviewed, patient examined, no change in status, stable for endoscopic procedure(s).  I have reviewed the patient's chart and labs.  Questions were answered to the patient's satisfaction.     Kenney Peacemaker, MD, [facility]

## 2024-01-05 NOTE — Patient Instructions (Addendum)
 I did not see any obvious signs of inflammation or colitis. I did take biopsies looking for microscopic changes.  You do have some diverticulosis and internal hemorrhoids - common and not a cause for concern.  I appreciate the opportunity to care for you. Kenney Peacemaker, MD, [facility]  Continue present medications. Await pathology results. Repeat colonoscopy is recommended. The                            colonoscopy date will be determined after pathology                            results from today's exam become available for                            review.  YOU HAD AN ENDOSCOPIC PROCEDURE TODAY AT THE [facility]:   Refer to the procedure report that was given to you for any specific questions about what was found during the examination.  If the procedure report does not answer your questions, please call your gastroenterologist to clarify.  If you requested that your care partner not be given the details of your procedure findings, then the procedure report has been included in a sealed envelope for you to review at your convenience later.  YOU SHOULD EXPECT: Some feelings of bloating in the abdomen. Passage of more gas than usual.  Walking can help get rid of the air that was put into your GI tract during the procedure and reduce the bloating. If you had a lower endoscopy (such as a colonoscopy or flexible sigmoidoscopy) you may notice spotting of blood in your stool or on the toilet paper. If you underwent a bowel prep for your procedure, you may not have a normal bowel movement for a few days.  Please Note:  You might notice some irritation and congestion in your nose or some drainage.  This is from the oxygen used during your procedure.  There is no need for concern and it should clear up in a day or so.  SYMPTOMS TO REPORT IMMEDIATELY:  Following lower endoscopy (colonoscopy or flexible sigmoidoscopy):  Excessive amounts of blood in the stool  Significant tenderness or  worsening of abdominal pains  Swelling of the abdomen that is new, acute  Fever of 100F or higher  Following upper endoscopy (EGD)  Vomiting of blood or coffee ground material  New chest pain or pain under the shoulder blades  Painful or persistently difficult swallowing  New shortness of breath  Fever of 100F or higher  Black, tarry-looking stools  For urgent or emergent issues, a gastroenterologist can be reached at any hour by calling [phone]. Do not use MyChart messaging for urgent concerns.    DIET:  We do recommend a small meal at first, but then you may proceed to your regular diet.  Drink plenty of fluids but you should avoid alcoholic beverages for 24 hours.  ACTIVITY:  You should plan to take it easy for the rest of today and you should NOT DRIVE or use heavy machinery until tomorrow (because of the sedation medicines used during the test).    FOLLOW UP: Our staff will call the number listed on your records the next business day following your procedure.  We will call around 7:15- 8:00 am to check on you and address any  questions or concerns that you may have regarding the information given to you following your procedure. If we do not reach you, we will leave a message.     If any biopsies were taken you will be contacted by phone or by letter within the next 1-3 weeks.  Please call us  at [phone] if you have not heard about the biopsies in 3 weeks.    SIGNATURES/CONFIDENTIALITY: You and/or your care partner have signed paperwork which will be entered into your electronic medical record.  These signatures attest to the fact that that the information above on your After Visit Summary has been reviewed and is understood.  Full responsibility of the confidentiality of this discharge information lies with you and/or your care-partner.

## 2024-01-05 NOTE — Progress Notes (Signed)
 Called to room to assist during endoscopic procedure.  Patient ID and intended procedure confirmed with present staff. Received instructions for my participation in the procedure from the performing physician.

## 2024-01-05 NOTE — Op Note (Signed)
 [facility] Patient Name: Tracy Landry Procedure Date: 01/05/2024 12:01 PM MRN: [id number] Endoscopist: Kenney Peacemaker , MD, [id number] Age: 63 Referring MD:  Date of Birth: 1961/05/11 Gender: Female Account #: [id number] Procedure:                Colonoscopy Indications:              Abnormal CT of the GI tract, Change in bowel habits                            - diarrhea - colitis suspected ED visit 5/12 Medicines:                Monitored Anesthesia Care Procedure:                Pre-Anesthesia Assessment:                           - Prior to the procedure, a History and Physical                            was performed, and patient medications and                            allergies were reviewed. The patient's tolerance of                            previous anesthesia was also reviewed. The risks                            and benefits of the procedure and the sedation                            options and risks were discussed with the patient.                            All questions were answered, and informed consent                            was obtained. Prior Anticoagulants: The patient has                            taken no anticoagulant or antiplatelet agents. ASA                            Grade Assessment: III - A patient with severe                            systemic disease. After reviewing the risks and                            benefits, the patient was deemed in satisfactory                            condition to undergo the procedure.  After obtaining informed consent, the colonoscope                            was passed under direct vision. Throughout the                            procedure, the patient's blood pressure, pulse, and                            oxygen saturations were monitored continuously. The                            CF [id number] #[id number] was introduced through the anus                            and  advanced to the the terminal ileum, with                            identification of the appendiceal orifice and IC                            valve. The colonoscopy was performed without                            difficulty. The patient tolerated the procedure                            well. The quality of the bowel preparation was                            good. The terminal ileum, ileocecal valve,                            appendiceal orifice, and rectum were photographed.                            The bowel preparation used was SUPREP via split                            dose instruction. Scope In: 12:08:28 PM Scope Out: 12:24:36 PM Scope Withdrawal Time: 0 hours 10 minutes 2 seconds  Total Procedure Duration: 0 hours 16 minutes 8 seconds  Findings:                 The perianal and digital rectal examinations were                            normal.                           Multiple diverticula were found in the sigmoid                            colon.  Internal hemorrhoids were found.                           The terminal ileum appeared normal.                           The exam was otherwise without abnormality on                            direct and retroflexion views.                           Biopsies for histology were taken with a cold                            forceps from the entire colon for evaluation of                            microscopic colitis. Complications:            No immediate complications. Estimated Blood Loss:     Estimated blood loss was minimal. Impression:               - Diverticulosis in the sigmoid colon.                           - Internal hemorrhoids.                           - The examined portion of the ileum was normal.                           - The examination was otherwise normal on direct                            and retroflexion views.                           - Biopsies were taken with a cold  forceps from the                            entire colon for evaluation of microscopic colitis.                           - ? if this is a post-infectious IBS (stool Cx and                            C diff were negative but perhaps other pathogen(s)                            or timing issue. Recommendation:           - Patient has a contact number available for                            emergencies. The signs and symptoms of potential  delayed complications were discussed with the                            patient. Return to normal activities tomorrow.                            Written discharge instructions were provided to the                            patient.                           - Resume previous diet.                           - Continue present medications.                           - Await pathology results.                           - Repeat colonoscopy is recommended. The                            colonoscopy date will be determined after pathology                            results from today's exam become available for                            review. Kenney Peacemaker, MD 01/05/2024 12:32:54 PM This report has been signed electronically.

## 2024-01-06 ENCOUNTER — Telehealth: Payer: Self-pay

## 2024-01-06 NOTE — Telephone Encounter (Signed)
  Follow up Call-     01/05/2024   11:12 AM  Call back number  Post procedure Call Back phone  # [phone]  Permission to leave phone message Yes     Patient questions:  Do you have a fever, pain , or abdominal swelling? No. Pain Score  0 *  Have you tolerated food without any problems? Yes.    Have you been able to return to your normal activities? Yes.    Do you have any questions about your discharge instructions: Diet   No. Medications  No. Follow up visit  No.  Do you have questions or concerns about your Care? No.  Actions: * If pain score is 4 or above: No action needed, pain <4.

## 2024-01-08 LAB — SURGICAL PATHOLOGY

## 2024-01-10 ENCOUNTER — Ambulatory Visit: Payer: Self-pay | Admitting: Internal Medicine

## 2024-01-20 ENCOUNTER — Ambulatory Visit: Admitting: Physician Assistant

## 2024-02-20 ENCOUNTER — Ambulatory Visit (INDEPENDENT_AMBULATORY_CARE_PROVIDER_SITE_OTHER): Payer: 59 | Admitting: Obstetrics & Gynecology

## 2024-02-20 ENCOUNTER — Encounter (HOSPITAL_BASED_OUTPATIENT_CLINIC_OR_DEPARTMENT_OTHER): Payer: Self-pay | Admitting: Obstetrics & Gynecology

## 2024-02-20 VITALS — BP 130/63 | HR 79 | Wt 192.0 lb

## 2024-02-20 DIAGNOSIS — Z01419 Encounter for gynecological examination (general) (routine) without abnormal findings: Secondary | ICD-10-CM

## 2024-02-20 DIAGNOSIS — Z90710 Acquired absence of both cervix and uterus: Secondary | ICD-10-CM

## 2024-02-20 DIAGNOSIS — Z780 Asymptomatic menopausal state: Secondary | ICD-10-CM | POA: Diagnosis not present

## 2024-02-20 DIAGNOSIS — Z8719 Personal history of other diseases of the digestive system: Secondary | ICD-10-CM

## 2024-02-20 NOTE — Progress Notes (Signed)
 ANNUAL EXAM Patient name: Tracy Landry MRN [id number]  Date of birth: January 09, 1961 Chief Complaint:   Gynecologic Exam  History of Present Illness:   Tracy Landry is a 63 y.o. G0P0000 Caucasian female being seen today for a routine annual exam.  Had ER visit in May and was diagnosed with colitis.  She was treated with steriods and had a colonoscopy.    Patient's last menstrual period was 10/28/2006.  Last pap not indicated Last mammogram: 03/19/2023. Results were: normal. Family h/o breast cancer: no Last colonoscopy: 01/05/2024. Results were: abnormal focal active colitis. Family h/o colorectal cancer: no DEXA:  normal     12/22/2023    5:10 PM 03/17/2023    1:08 PM 12/02/2022   11:53 AM 03/11/2022   11:33 AM 02/15/2022   10:44 AM  Depression screen PHQ 2/9  Decreased Interest 1 0 2 1 0  Down, Depressed, Hopeless 2 0 1 0 0  PHQ - 2 Score 3 0 3 1 0  Altered sleeping 1  3 3    Tired, decreased energy 1  3 3    Change in appetite 0  1 1   Feeling bad or failure about yourself  0  1 0   Trouble concentrating 0  0 2   Moving slowly or fidgety/restless 0  1 0   Suicidal thoughts   0 0   PHQ-9 Score 5  12 10    Difficult doing work/chores Not difficult at all  Somewhat difficult Somewhat difficult     Review of Systems:   Pertinent items are noted in HPI Denies any urinary changes.  Bowel changes as per above. Pelvic has improved.   Pertinent History Reviewed:  Reviewed past medical,surgical, social and family history.  Reviewed problem list, medications and allergies. Physical Assessment:   Vitals:   02/20/24 1016  BP: 130/63  Pulse: 79  SpO2: 100%  Weight: 192 lb (87.1 kg)  Body mass index is 32.96 kg/m.        Physical Examination:   General appearance - well appearing, and in no distress  Mental status - alert, oriented to person, place, and time  Psych:  She has a normal mood and affect  Skin - warm and dry, normal color, no suspicious lesions noted  Chest -  effort normal, all lung fields clear to auscultation bilaterally  Heart - normal rate and regular rhythm  Neck:  midline trachea, no thyromegaly or nodules  Breasts - breasts appear normal, no suspicious masses, no skin or nipple changes or  axillary nodes  Abdomen - soft, nontender, nondistended, no masses or organomegaly  Pelvic - VULVA: normal appearing vulva with no masses, tenderness or lesions   VAGINA: normal appearing vagina with normal color and discharge, no lesions   CERVIX: surgically absent  Thin prep pap is not indicated  UTERUS: surgically absent  ADNEXA: No adnexal masses or tenderness noted.  Rectal - normal rectal, good sphincter tone, no masses felt.  Extremities:  No swelling or varicosities noted  Chaperone present for exam  Assessment & Plan:  1. Well woman exam with routine gynecological exam (Primary) - Pap smear not indicated - Mammogram 03/20/2023 - Colonoscopy 12/2023 - Bone mineral density 2023 - lab work done with PCP, Darice Brownie - vaccines reviewed/updated  2. History of colitis - off steroid, much improved  3. H/O: hysterectomy  4. Postmenopausal - not on HRT   Meds: No orders of the defined types were placed in this encounter.  Follow-up: Return in about 1 year (around 02/19/2025).  Ronal GORMAN Pinal, MD 02/20/2024 11:07 AM

## 2024-03-01 ENCOUNTER — Encounter (HOSPITAL_BASED_OUTPATIENT_CLINIC_OR_DEPARTMENT_OTHER): Payer: Self-pay | Admitting: Obstetrics & Gynecology

## 2024-03-02 DIAGNOSIS — M722 Plantar fascial fibromatosis: Secondary | ICD-10-CM | POA: Diagnosis not present

## 2024-03-02 DIAGNOSIS — M1711 Unilateral primary osteoarthritis, right knee: Secondary | ICD-10-CM | POA: Diagnosis not present

## 2024-03-02 DIAGNOSIS — M1811 Unilateral primary osteoarthritis of first carpometacarpal joint, right hand: Secondary | ICD-10-CM | POA: Diagnosis not present

## 2024-03-16 ENCOUNTER — Encounter: Payer: Self-pay | Admitting: Family Medicine

## 2024-03-16 ENCOUNTER — Other Ambulatory Visit: Payer: Self-pay | Admitting: Family Medicine

## 2024-03-16 DIAGNOSIS — I10 Essential (primary) hypertension: Secondary | ICD-10-CM

## 2024-03-17 ENCOUNTER — Other Ambulatory Visit: Payer: Self-pay

## 2024-03-17 DIAGNOSIS — M720 Palmar fascial fibromatosis [Dupuytren]: Secondary | ICD-10-CM | POA: Diagnosis not present

## 2024-03-17 DIAGNOSIS — G8918 Other acute postprocedural pain: Secondary | ICD-10-CM | POA: Diagnosis not present

## 2024-03-17 DIAGNOSIS — M7521 Bicipital tendinitis, right shoulder: Secondary | ICD-10-CM | POA: Diagnosis not present

## 2024-03-18 LAB — SURGICAL PATHOLOGY

## 2024-03-19 ENCOUNTER — Other Ambulatory Visit: Payer: Self-pay | Admitting: Family Medicine

## 2024-03-19 DIAGNOSIS — F331 Major depressive disorder, recurrent, moderate: Secondary | ICD-10-CM

## 2024-03-19 DIAGNOSIS — G894 Chronic pain syndrome: Secondary | ICD-10-CM

## 2024-03-19 DIAGNOSIS — E782 Mixed hyperlipidemia: Secondary | ICD-10-CM

## 2024-03-19 DIAGNOSIS — M459 Ankylosing spondylitis of unspecified sites in spine: Secondary | ICD-10-CM

## 2024-03-19 DIAGNOSIS — J45909 Unspecified asthma, uncomplicated: Secondary | ICD-10-CM

## 2024-03-19 MED ORDER — PRAVASTATIN SODIUM 20 MG PO TABS: 20.0000 mg | ORAL_TABLET | Freq: Every day | ORAL | 1 refills | Status: DC

## 2024-03-19 MED ORDER — MONTELUKAST SODIUM 10 MG PO TABS: 10.0000 mg | ORAL_TABLET | Freq: Every day | ORAL | 1 refills | Status: DC

## 2024-03-19 MED ORDER — CYCLOBENZAPRINE HCL 10 MG PO TABS
10.0000 mg | ORAL_TABLET | Freq: Every day | ORAL | 1 refills | Status: AC
Start: 2024-03-19 — End: ?

## 2024-03-19 MED ORDER — VENLAFAXINE HCL ER 150 MG PO CP24: 150.0000 mg | ORAL_CAPSULE | Freq: Every day | ORAL | 1 refills | Status: DC

## 2024-03-23 DIAGNOSIS — M79641 Pain in right hand: Secondary | ICD-10-CM | POA: Diagnosis not present

## 2024-03-31 ENCOUNTER — Encounter: Payer: Self-pay | Admitting: Gastroenterology

## 2024-03-31 ENCOUNTER — Ambulatory Visit: Admitting: Gastroenterology

## 2024-03-31 VITALS — BP 132/74 | HR 73 | Ht 70.0 in | Wt 191.0 lb

## 2024-03-31 DIAGNOSIS — R630 Anorexia: Secondary | ICD-10-CM

## 2024-03-31 DIAGNOSIS — K219 Gastro-esophageal reflux disease without esophagitis: Secondary | ICD-10-CM | POA: Diagnosis not present

## 2024-03-31 DIAGNOSIS — R195 Other fecal abnormalities: Secondary | ICD-10-CM | POA: Diagnosis not present

## 2024-03-31 DIAGNOSIS — R194 Change in bowel habit: Secondary | ICD-10-CM | POA: Diagnosis not present

## 2024-03-31 NOTE — Progress Notes (Signed)
 Tracy Landry [id number] 1961-07-06   Chief Complaint: Change in bowel habits  Referring Provider: Booker Darice SAUNDERS, FNP Primary GI MD: Dr. Avram  HPI: Tracy Landry is a 63 y.o. female with past medical history of  GERD, asthma, HTN, SVT s/p ablation 2014, prior appendectomy, total abdominal hysterectomy 2009, ankylosing spondylitis who presents today for follow up.  Patient seen at [facility] ED 12/15/2023 with complaint of diarrhea and abdominal cramping ongoing for 1 month.  Had been seen by PCP at the end of April, had laboratory workup including stool cultures and C. difficile which were negative, and she was started on azithromycin  for empiric treatment of presumed infectious diarrhea.  Workup in the ED included CT A/P which was notable for diffuse colitis, with low suspicion for infectious colitis given the lack of white count.  Possible inflammatory colitis as patient does have history of ankylosing spondylitis.  She was given a course of steroids along with Zofran , inflammatory markers added on to workup.   Labs 12/15/2023: BUN 7, otherwise normal CMP.  Normal magnesium, normal lipase, normal CBC, negative UTI.  Elevated sed rate at 31, elevated CRP at 1.0.   Had follow up with PCP 5/19, symptoms at that time had improved after prednisone  50mg  daily for 5 days.  At last visit 12/31/2023 patient endorsed frequent loose stools beginning in April.  Workup was negative for infection.  She was treated with empiric course of azithromycin  which did not improve her symptoms.  Seen in the ED and found to have diffuse colonic inflammation on CT A/P along with elevated inflammatory markers, treated with 5-day course of prednisone  with improvement.  At time of visit was feeling better.  Endorsed some weight loss.  Labs at that time were normal.  She underwent colonoscopy 01/05/2024 with finding of diverticulosis, internal hemorrhoids, and otherwise normal.  Biopsies were taken of  the entire colon for evaluation of microscopic colitis.  Biopsy showed focal active colitis negative for acute inflammation.  Thought is that these are leftover changes from recent illness, with no role for medication at this time.   Patient here today with wife Heron.  Patient states she has been gradually feeling better but continues to have intermittent loose stools and decreased appetite.  Has been forcing herself to eat and weight has been more stable.  States she typically has 1-2 bowel movements daily which are formed, but every now and then will have a flare with more frequent bowel movements that are more thin and soft than normal.  She thinks these flareups may be stress-induced.  She denies any watery diarrhea.  Denies blood in her stool or melena.  She has no associated abdominal or rectal pain.  Has some gurgling noises in her stomach but does not have any increased belching or flatulence compared to normal. Denies any fever, chills, nausea, vomiting.  States that Nexium is working well to control any reflux symptoms.  Previous GI Procedures/Imaging   Colonoscopy 01/05/2024 - Diverticulosis in the sigmoid colon.  - Internal hemorrhoids.  - The examined portion of the ileum was normal.  - The examination was otherwise normal on direct and retroflexion views. - Biopsies were taken with a cold forceps from the entire colon for evaluation of microscopic colitis.  - ? if this is a post- infectious IBS ( stool Cx and C diff were negative but perhaps other pathogen( s) or timing issue. - Recall 10 years Path: 1. Surgical [P], random colon sites :       -  FOCAL ACTIVE COLITIS       - NEGATIVE FOR ACUTE INFLAMMATION, INCREASED INTRAEPITHELIAL LYMPHOCYTES OR       THICKENED SUBEPITHELIAL COLLAGEN TABLE   CT A/P 12/15/2023 - Diffuse mucosal thickening involving the entire colon which could correlate with colitis. Inflammatory, infectious or pseudomembranous. - Mild diffuse diverticulosis  descending and sigmoid colon without diverticulitis. - Small to medium-sized hiatal hernia.   Cologuard 03/13/2022 - Negative   Colonoscopy 05/08/2012 (Dr. 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   Wears glasses     Past Surgical History:  Procedure Laterality Date   ABLATION OF DYSRHYTHMIC FOCUS  10/07/2012   SVT   APPENDECTOMY     COLONOSCOPY     FOOT SURGERY  2000   rt foot fx   HAND SURGERY     HARDWARE REMOVAL  2003   rt arm   KNEE ARTHROSCOPY WITH MEDIAL MENISECTOMY Right 03/14/2022   Procedure: KNEE ARTHROSCOPY WITH MEDIAL MENISCAL ROOT REPAIR;  Surgeon: Cristy Bonner DASEN, MD;  Location: [facility];  Service: Orthopedics;  Laterality: Right;   MASS EXCISION Left 07/07/2013   Procedure: EXCISION MASS LEFT RING FINGER;  Surgeon: Arley JONELLE Curia, MD;  Location: [facility];  Service: Orthopedics;  Laterality: Left;   PATELLECTOMY     REPAIR EXTENSOR TENDON Left 07/07/2013   Procedure: REPAIR EXTENSOR TENDON LEFT WITH DEBRIDMENT PROXIMAL INTERPHALANGEAL JOINT;  Surgeon: Arley JONELLE Curia, MD;  Location: [facility];  Service: Orthopedics;  Laterality: Left;   right arm reconstruction  08/1999   SUPRAVENTRICULAR TACHYCARDIA ABLATION N/A 10/07/2012   Procedure: SUPRAVENTRICULAR TACHYCARDIA ABLATION;  Surgeon: Danelle LELON Birmingham, MD;  Location: [facility] CATH LAB;  Service: Cardiovascular;  Laterality: N/A;   TOTAL ABDOMINAL HYSTERECTOMY  10/2007   TOTAL KNEE ARTHROPLASTY      Current Outpatient Medications  Medication Sig Dispense Refill   albuterol  (VENTOLIN  HFA) 108 (90 Base) MCG/ACT inhaler Inhale 2  puffs into the lungs every 6 (six) hours as needed. 18 g 0   cyclobenzaprine  (FLEXERIL ) 10 MG tablet Take 1 tablet (10 mg total) by mouth at bedtime. 90 tablet 1   diclofenac  (VOLTAREN ) 75 MG EC tablet Take 1 tablet by mouth twice daily 180 tablet 0   esomeprazole (NEXIUM) 20 MG capsule Take by mouth.     gabapentin  (NEURONTIN ) 300 MG capsule TAKE 1 CAPSULE BY MOUTH THREE TIMES DAILY 90 capsule 0   montelukast  (SINGULAIR ) 10 MG tablet Take 1 tablet (10 mg total) by mouth at bedtime. 90 tablet 1   pravastatin  (PRAVACHOL ) 20 MG tablet Take 1 tablet (20 mg total) by mouth daily. 90 tablet 1   venlafaxine  XR (EFFEXOR -XR) 150 MG 24 hr capsule Take 1 capsule (150 mg total) by mouth daily with breakfast. 90 capsule 1   No current facility-administered medications for this visit.    Allergies as of 03/31/2024 - Review Complete 03/31/2024  Allergen Reaction Noted   Chlorhexidine  gluconate Itching 09/14/2017   Amoxicillin -pot clavulanate Diarrhea 09/22/2017   Codeine sulfate Itching     Family History  Problem Relation  Age of Onset   Alcohol abuse Mother    Diabetes Mother    Hyperlipidemia Mother    Hypertension Mother    Heart disease Mother        Rheumatic disease   Alzheimer's disease Father    Diabetes Sister    Hyperlipidemia Sister    Hypertension Sister    Heart attack Maternal Uncle    Lung cancer Maternal Grandmother        lung   Heart attack Maternal Grandfather    Colon cancer Neg Hx    Esophageal cancer Neg Hx    Stomach cancer Neg Hx    Rectal cancer Neg Hx     Social History   Tobacco Use   Smoking status: Never   Smokeless tobacco: Never  Vaping Use   Vaping status: Former  Substance Use Topics   Alcohol use: Yes    Comment: 3 a week   Drug use: No     Review of Systems:    Constitutional: No fever, chills  Cardiovascular: No chest pain Respiratory: No SOB Gastrointestinal: See HPI and otherwise negative Hematologic: No bleeding     Physical Exam:   Vital signs: BP 132/74 (BP Location: Left Arm, Patient Position: Sitting, Cuff Size: Normal)   Pulse 73   Ht 5' 10 (1.778 m)   Wt 191 lb (86.6 kg)   LMP 10/28/2006   BMI 27.41 kg/m   Wt Readings from Last 3 Encounters:  03/31/24 191 lb (86.6 kg)  02/20/24 192 lb (87.1 kg)  01/05/24 198 lb (89.8 kg)     Constitutional: Pleasant, well-appearing female in NAD, alert and cooperative Head:  Normocephalic and atraumatic.  Eyes: No scleral icterus. Respiratory: Respirations even and unlabored. Lungs clear to auscultation bilaterally.  No wheezes, crackles, or rhonchi.  Cardiovascular:  Regular rate and rhythm. No murmurs. No peripheral edema. Gastrointestinal:  Soft, nondistended, nontender. No rebound or guarding. Normal bowel sounds. No appreciable masses or hepatomegaly. Rectal:  Not performed.  Neurologic:  Alert and oriented x4;  grossly normal neurologically.  Skin:   Dry and intact without significant lesions or rashes. Psychiatric: Oriented to person, place and time. Demonstrates good judgement and reason without abnormal affect or behaviors.   RELEVANT LABS AND IMAGING: CBC    Component Value Date/Time   WBC 6.5 12/31/2023 1442   RBC 4.60 12/31/2023 1442   HGB 13.8 12/31/2023 1442   HGB 14.3 12/01/2023 1125   HGB 13.2 06/23/2014 1539   HCT 42.2 12/31/2023 1442   HCT 43.3 12/01/2023 1125   PLT 305.0 12/31/2023 1442   PLT 337 12/01/2023 1125   MCV 91.7 12/31/2023 1442   MCV 93 12/01/2023 1125   MCH 30.2 12/15/2023 1028   MCHC 32.8 12/31/2023 1442   RDW 13.9 12/31/2023 1442   RDW 13.0 12/01/2023 1125   LYMPHSABS 2.0 12/31/2023 1442   MONOABS 0.5 12/31/2023 1442   EOSABS 0.1 12/31/2023 1442   BASOSABS 0.0 12/31/2023 1442    CMP     Component Value Date/Time   NA 139 12/31/2023 1442   NA 141 12/01/2023 1125   K 4.0 12/31/2023 1442   CL 103 12/31/2023 1442   CO2 28 12/31/2023 1442   GLUCOSE 91 12/31/2023 1442   BUN 11 12/31/2023 1442   BUN 9 12/01/2023  1125   CREATININE 0.77 12/31/2023 1442   CREATININE 0.82 03/11/2022 1129   CALCIUM 9.3 12/31/2023 1442   PROT 7.2 12/31/2023 1442   PROT 7.4 12/01/2023 1125  ALBUMIN 4.3 12/31/2023 1442   ALBUMIN 4.3 12/01/2023 1125   AST 18 12/31/2023 1442   ALT 16 12/31/2023 1442   ALKPHOS 56 12/31/2023 1442   BILITOT 0.3 12/31/2023 1442   BILITOT <0.2 12/01/2023 1125   GFRNONAA >60 12/15/2023 1028   GFRAA >90 06/30/2013 1255     Assessment/Plan:   Change in bowel habits Loose stools Decreased appetite GERD Patient seen today for follow-up.  Beginning in April was having frequent loose stools and abdominal cramping.  Had workup including stool cultures and C. difficile which were negative and was started on azithromycin  for empiric treatment of presumed infectious diarrhea.  Workup in the ED in May was notable for finding of diffuse colitis on CT A/P.  She did have an elevated sed rate.  Symptoms improved after treatment with 5 days of prednisone .  Had some weight loss in this time. She underwent colonoscopy 01/05/2024 with finding of diverticulosis, internal hemorrhoids, and otherwise normal appearance of the colon.  Biopsies showed focal active colitis negative for acute inflammation. 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  Today patient states she has gradually been feeling better but continues to have intermittent loose stools.  Typically has 1-2 formed bowel movements daily, but every now and then will have 3 to 4 days of a flare in which she has more frequent bowel movements that are thin and soft but not loose or watery.  Denies any blood in her stool or melena.  Denies any associated abdominal or rectal pain.  Has had some increased gurgling noises in her stomach but denies any increased belching or flatulence compared to normal.  Reflux is controlled on Nexium. She has had a decreased appetite but has been forcing herself to eat and states that her weight has been more stable.  - Recommend increasing  dietary fiber to help bulk stools - Consider adding fiber supplement such as Benefiber or Metamucil - Will give samples of Ibgard - Consider antispasmodic if development of abdominal cramping - Continue to monitor weight  - Call if new or worsening symptoms - Follow up 3 months   Camie Furbish, PA-C [facility] 03/31/2024, 1:35 PM  Patient Care Team: Booker Darice SAUNDERS, FNP as PCP - General (Family Medicine)

## 2024-03-31 NOTE — Patient Instructions (Signed)
 Increase dietary fiber You can add a fiber supplement such as Benefiber, 1 tablespoon daily  You have been given IB Gard samples - take as directed.  Call if new or worsening symptoms.  Please follow up in three months  _______________________________________________________  If your blood pressure at your visit was 140/90 or greater, please contact your primary care physician to follow up on this.  _______________________________________________________  If you are age 64 or older, your body mass index should be between 23-30. Your Body mass index is 27.41 kg/m. If this is out of the aforementioned range listed, please consider follow up with your Primary Care Provider.  If you are age 71 or younger, your body mass index should be between 19-25. Your Body mass index is 27.41 kg/m. If this is out of the aformentioned range listed, please consider follow up with your Primary Care Provider.   ________________________________________________________  The [facility] providers would like to encourage you to use MYCHART to communicate with providers for non-urgent requests or questions.  Due to long hold times on the telephone, sending your provider a message by [facility] may be a faster and more efficient way to get a response.  Please allow 48 business hours for a response.  Please remember that this is for non-urgent requests.  _______________________________________________________  [facility] is using a team-based approach to care.  Your team is made up of your doctor and two to three APPS. Our APPS (Nurse Practitioners and Physician Assistants) work with your physician to ensure care continuity for you. They are fully qualified to address your health concerns and develop a treatment plan. They communicate directly with your gastroenterologist to care for you. Seeing the Advanced Practice Practitioners on your physician's team can help you by facilitating care more promptly,  often allowing for earlier appointments, access to diagnostic testing, procedures, and other specialty referrals.

## 2024-04-01 DIAGNOSIS — M79642 Pain in left hand: Secondary | ICD-10-CM | POA: Diagnosis not present

## 2024-04-06 ENCOUNTER — Encounter: Payer: Self-pay | Admitting: Sports Medicine

## 2024-04-07 ENCOUNTER — Other Ambulatory Visit: Payer: Self-pay | Admitting: Obstetrics & Gynecology

## 2024-04-07 DIAGNOSIS — Z1231 Encounter for screening mammogram for malignant neoplasm of breast: Secondary | ICD-10-CM

## 2024-04-13 DIAGNOSIS — M79642 Pain in left hand: Secondary | ICD-10-CM | POA: Diagnosis not present

## 2024-04-13 DIAGNOSIS — M720 Palmar fascial fibromatosis [Dupuytren]: Secondary | ICD-10-CM | POA: Diagnosis not present

## 2024-04-13 DIAGNOSIS — M1811 Unilateral primary osteoarthritis of first carpometacarpal joint, right hand: Secondary | ICD-10-CM | POA: Diagnosis not present

## 2024-04-21 DIAGNOSIS — M1811 Unilateral primary osteoarthritis of first carpometacarpal joint, right hand: Secondary | ICD-10-CM | POA: Diagnosis not present

## 2024-04-21 DIAGNOSIS — G8918 Other acute postprocedural pain: Secondary | ICD-10-CM | POA: Diagnosis not present

## 2024-05-04 DIAGNOSIS — M79641 Pain in right hand: Secondary | ICD-10-CM | POA: Diagnosis not present

## 2024-05-11 DIAGNOSIS — M79644 Pain in right finger(s): Secondary | ICD-10-CM | POA: Diagnosis not present

## 2024-05-11 DIAGNOSIS — M25532 Pain in left wrist: Secondary | ICD-10-CM | POA: Diagnosis not present

## 2024-05-11 DIAGNOSIS — G8929 Other chronic pain: Secondary | ICD-10-CM | POA: Diagnosis not present

## 2024-05-25 DIAGNOSIS — M25532 Pain in left wrist: Secondary | ICD-10-CM | POA: Diagnosis not present

## 2024-05-25 DIAGNOSIS — M79644 Pain in right finger(s): Secondary | ICD-10-CM | POA: Diagnosis not present

## 2024-05-25 DIAGNOSIS — G8929 Other chronic pain: Secondary | ICD-10-CM | POA: Diagnosis not present

## 2024-05-27 ENCOUNTER — Ambulatory Visit

## 2024-05-27 DIAGNOSIS — Z1231 Encounter for screening mammogram for malignant neoplasm of breast: Secondary | ICD-10-CM

## 2024-06-01 DIAGNOSIS — M79644 Pain in right finger(s): Secondary | ICD-10-CM | POA: Diagnosis not present

## 2024-06-01 DIAGNOSIS — M25532 Pain in left wrist: Secondary | ICD-10-CM | POA: Diagnosis not present

## 2024-06-01 DIAGNOSIS — G8929 Other chronic pain: Secondary | ICD-10-CM | POA: Diagnosis not present

## 2024-06-17 DIAGNOSIS — G8929 Other chronic pain: Secondary | ICD-10-CM | POA: Diagnosis not present

## 2024-06-17 DIAGNOSIS — M79644 Pain in right finger(s): Secondary | ICD-10-CM | POA: Diagnosis not present

## 2024-06-17 DIAGNOSIS — M1811 Unilateral primary osteoarthritis of first carpometacarpal joint, right hand: Secondary | ICD-10-CM | POA: Diagnosis not present

## 2024-06-17 DIAGNOSIS — M25532 Pain in left wrist: Secondary | ICD-10-CM | POA: Diagnosis not present

## 2024-06-17 DIAGNOSIS — M720 Palmar fascial fibromatosis [Dupuytren]: Secondary | ICD-10-CM | POA: Diagnosis not present

## 2024-06-29 DIAGNOSIS — G8929 Other chronic pain: Secondary | ICD-10-CM | POA: Diagnosis not present

## 2024-06-29 DIAGNOSIS — M79644 Pain in right finger(s): Secondary | ICD-10-CM | POA: Diagnosis not present

## 2024-06-29 DIAGNOSIS — M25532 Pain in left wrist: Secondary | ICD-10-CM | POA: Diagnosis not present

## 2024-09-06 ENCOUNTER — Other Ambulatory Visit: Payer: Self-pay | Admitting: Family Medicine

## 2024-09-06 DIAGNOSIS — J45909 Unspecified asthma, uncomplicated: Secondary | ICD-10-CM

## 2024-09-06 DIAGNOSIS — F331 Major depressive disorder, recurrent, moderate: Secondary | ICD-10-CM

## 2024-09-06 DIAGNOSIS — E782 Mixed hyperlipidemia: Secondary | ICD-10-CM

## 2024-09-06 DIAGNOSIS — G894 Chronic pain syndrome: Secondary | ICD-10-CM
